# Patient Record
Sex: Female | Born: 1945
Health system: Southern US, Community
[De-identification: ages and names within clinical notes are randomized; demographics above are authoritative.]

## PROBLEM LIST (undated history)

## (undated) ENCOUNTER — Ambulatory Visit: Admission: EM | Source: Home / Self Care

## (undated) DIAGNOSIS — R519 Headache, unspecified: Secondary | ICD-10-CM

## (undated) DIAGNOSIS — E785 Hyperlipidemia, unspecified: Secondary | ICD-10-CM

## (undated) DIAGNOSIS — M1990 Unspecified osteoarthritis, unspecified site: Secondary | ICD-10-CM

## (undated) DIAGNOSIS — K219 Gastro-esophageal reflux disease without esophagitis: Secondary | ICD-10-CM

## (undated) DIAGNOSIS — D860 Sarcoidosis of lung: Secondary | ICD-10-CM

## (undated) DIAGNOSIS — E119 Type 2 diabetes mellitus without complications: Secondary | ICD-10-CM

## (undated) DIAGNOSIS — I700 Atherosclerosis of aorta: Secondary | ICD-10-CM

## (undated) DIAGNOSIS — J45909 Unspecified asthma, uncomplicated: Secondary | ICD-10-CM

## (undated) DIAGNOSIS — Z87898 Personal history of other specified conditions: Secondary | ICD-10-CM

## (undated) DIAGNOSIS — R51 Headache: Secondary | ICD-10-CM

## (undated) DIAGNOSIS — T7840XA Allergy, unspecified, initial encounter: Secondary | ICD-10-CM

## (undated) DIAGNOSIS — R0600 Dyspnea, unspecified: Secondary | ICD-10-CM

## (undated) DIAGNOSIS — I10 Essential (primary) hypertension: Secondary | ICD-10-CM

## (undated) DIAGNOSIS — J189 Pneumonia, unspecified organism: Secondary | ICD-10-CM

## (undated) DIAGNOSIS — D869 Sarcoidosis, unspecified: Secondary | ICD-10-CM

## (undated) HISTORY — PX: OTHER SURGICAL HISTORY: SHX169

## (undated) HISTORY — PX: EYE SURGERY: SHX253

## (undated) HISTORY — PX: TONSILLECTOMY: SUR1361

## (undated) HISTORY — DX: Allergy, unspecified, initial encounter: T78.40XA

## (undated) HISTORY — PX: MEDIASTINOSCOPY: SUR861

## (undated) SURGERY — ELECTROMAGNETIC NAVIGATION BRONCHOSCOPY
Anesthesia: General | Laterality: Right

---

## 2008-06-26 ENCOUNTER — Ambulatory Visit: Payer: Self-pay | Admitting: Specialist

## 2010-02-02 ENCOUNTER — Ambulatory Visit: Payer: Self-pay | Admitting: Unknown Physician Specialty

## 2010-02-08 ENCOUNTER — Ambulatory Visit: Payer: Self-pay | Admitting: Unknown Physician Specialty

## 2010-02-09 LAB — PATHOLOGY REPORT

## 2010-02-11 ENCOUNTER — Ambulatory Visit: Payer: Self-pay | Admitting: Specialist

## 2010-09-03 ENCOUNTER — Ambulatory Visit: Payer: Self-pay | Admitting: Specialist

## 2014-03-31 DIAGNOSIS — J4520 Mild intermittent asthma, uncomplicated: Secondary | ICD-10-CM | POA: Insufficient documentation

## 2014-03-31 DIAGNOSIS — D860 Sarcoidosis of lung: Secondary | ICD-10-CM | POA: Insufficient documentation

## 2015-01-25 ENCOUNTER — Other Ambulatory Visit: Payer: Self-pay | Admitting: Family Medicine

## 2015-01-26 DIAGNOSIS — D869 Sarcoidosis, unspecified: Secondary | ICD-10-CM | POA: Diagnosis not present

## 2015-01-26 DIAGNOSIS — J310 Chronic rhinitis: Secondary | ICD-10-CM | POA: Diagnosis not present

## 2015-01-26 DIAGNOSIS — J4520 Mild intermittent asthma, uncomplicated: Secondary | ICD-10-CM | POA: Diagnosis not present

## 2015-01-28 ENCOUNTER — Encounter: Payer: Self-pay | Admitting: *Deleted

## 2015-01-29 ENCOUNTER — Ambulatory Visit: Payer: Medicare PPO | Admitting: Anesthesiology

## 2015-01-29 ENCOUNTER — Encounter
Admission: RE | Disposition: A | Discharge status: Home / Self Care | Payer: Self-pay | Source: Ambulatory Visit | Attending: Unknown Physician Specialty

## 2015-01-29 ENCOUNTER — Ambulatory Visit
Admission: RE | Admit: 2015-01-29 | Discharge: 2015-01-29 | Disposition: A | Discharge status: Home / Self Care | Payer: Medicare PPO | Source: Ambulatory Visit | Attending: Unknown Physician Specialty | Admitting: Unknown Physician Specialty

## 2015-01-29 DIAGNOSIS — Z79899 Other long term (current) drug therapy: Secondary | ICD-10-CM | POA: Diagnosis not present

## 2015-01-29 DIAGNOSIS — Z1211 Encounter for screening for malignant neoplasm of colon: Secondary | ICD-10-CM | POA: Insufficient documentation

## 2015-01-29 DIAGNOSIS — D123 Benign neoplasm of transverse colon: Secondary | ICD-10-CM | POA: Insufficient documentation

## 2015-01-29 DIAGNOSIS — K5730 Diverticulosis of large intestine without perforation or abscess without bleeding: Secondary | ICD-10-CM | POA: Insufficient documentation

## 2015-01-29 DIAGNOSIS — K635 Polyp of colon: Secondary | ICD-10-CM | POA: Diagnosis not present

## 2015-01-29 DIAGNOSIS — K5790 Diverticulosis of intestine, part unspecified, without perforation or abscess without bleeding: Secondary | ICD-10-CM | POA: Diagnosis not present

## 2015-01-29 DIAGNOSIS — I10 Essential (primary) hypertension: Secondary | ICD-10-CM | POA: Insufficient documentation

## 2015-01-29 DIAGNOSIS — K648 Other hemorrhoids: Secondary | ICD-10-CM | POA: Insufficient documentation

## 2015-01-29 DIAGNOSIS — Z800 Family history of malignant neoplasm of digestive organs: Secondary | ICD-10-CM | POA: Diagnosis not present

## 2015-01-29 DIAGNOSIS — K640 First degree hemorrhoids: Secondary | ICD-10-CM | POA: Diagnosis not present

## 2015-01-29 HISTORY — DX: Essential (primary) hypertension: I10

## 2015-01-29 HISTORY — DX: Sarcoidosis, unspecified: D86.9

## 2015-01-29 HISTORY — PX: COLONOSCOPY WITH PROPOFOL: SHX5780

## 2015-01-29 SURGERY — COLONOSCOPY WITH PROPOFOL
Anesthesia: General

## 2015-01-29 MED ORDER — LIDOCAINE HCL (PF) 2 % IJ SOLN
INTRAMUSCULAR | Status: DC | PRN
  Administered 2015-01-29: 50 mg

## 2015-01-29 MED ORDER — SODIUM CHLORIDE 0.9 % IV SOLN
INTRAVENOUS | Status: DC
Start: 2015-01-29 — End: 2015-01-29
  Administered 2015-01-29: 1000 mL via INTRAVENOUS

## 2015-01-29 MED ORDER — PROPOFOL INFUSION 10 MG/ML OPTIME
INTRAVENOUS | Status: DC | PRN
  Administered 2015-01-29: 100 ug/kg/min via INTRAVENOUS

## 2015-01-29 MED ORDER — SODIUM CHLORIDE 0.9 % IV SOLN: INTRAVENOUS | Status: DC

## 2015-01-29 MED ORDER — EPHEDRINE SULFATE 50 MG/ML IJ SOLN
INTRAMUSCULAR | Status: DC | PRN
  Administered 2015-01-29: 10 mg via INTRAVENOUS

## 2015-01-29 MED ORDER — FENTANYL CITRATE (PF) 100 MCG/2ML IJ SOLN
INTRAMUSCULAR | Status: DC | PRN
  Administered 2015-01-29: 50 ug via INTRAVENOUS

## 2015-01-29 MED ORDER — PROPOFOL 10 MG/ML IV BOLUS
INTRAVENOUS | Status: DC | PRN
Start: 2015-01-29 — End: 2015-01-29
  Administered 2015-01-29: 50 mg via INTRAVENOUS

## 2015-01-29 MED ORDER — MIDAZOLAM HCL 5 MG/5ML IJ SOLN
INTRAMUSCULAR | Status: DC | PRN
  Administered 2015-01-29: 1 mg via INTRAVENOUS

## 2015-01-29 NOTE — Transfer of Care (Signed)
Immediate Anesthesia Transfer of Care Note  Patient: Mary Burke  Procedure(s) Performed: Procedure(s): COLONOSCOPY WITH PROPOFOL (N/A)  Patient Location: PACU  Anesthesia Type:General  Level of Consciousness: sedated  Airway & Oxygen Therapy: Patient Spontanous Breathing and Patient connected to nasal cannula oxygen  Post-op Assessment: Report given to RN and Post -op Vital signs reviewed and stable  Post vital signs: Reviewed and stable  Last Vitals:  Filed Vitals:   01/29/15 1237  BP: 110/73  Pulse: 77  Temp:   Resp: 15    Complications: No apparent anesthesia complications

## 2015-01-29 NOTE — Anesthesia Preprocedure Evaluation (Addendum)
Anesthesia Evaluation  Patient identified by MRN, date of birth, ID band Patient awake    Reviewed: Allergy & Precautions, NPO status , Patient's Chart, lab work & pertinent test results  History of Anesthesia Complications Negative for: history of anesthetic complications  Airway Mallampati: II  TM Distance: >3 FB Neck ROM: Full    Dental no notable dental hx.    Pulmonary neg pulmonary ROS,  breath sounds clear to auscultation  Pulmonary exam normal       Cardiovascular Exercise Tolerance: Good hypertension, Pt. on medications Normal cardiovascular examRhythm:Regular Rate:Normal     Neuro/Psych negative neurological ROS  negative psych ROS   GI/Hepatic Neg liver ROS, GERD-  Medicated and Controlled,  Endo/Other  negative endocrine ROS  Renal/GU negative Renal ROS  negative genitourinary   Musculoskeletal negative musculoskeletal ROS (+)   Abdominal   Peds negative pediatric ROS (+)  Hematology negative hematology ROS (+)   Anesthesia Other Findings sarcoidosis  Reproductive/Obstetrics negative OB ROS                            Anesthesia Physical Anesthesia Plan  ASA: III  Anesthesia Plan: General   Post-op Pain Management:    Induction: Intravenous  Airway Management Planned: Nasal Cannula  Additional Equipment:   Intra-op Plan:   Post-operative Plan:   Informed Consent: I have reviewed the patients History and Physical, chart, labs and discussed the procedure including the risks, benefits and alternatives for the proposed anesthesia with the patient or authorized representative who has indicated his/her understanding and acceptance.   Dental advisory given  Plan Discussed with: CRNA and Surgeon  Anesthesia Plan Comments:         Anesthesia Quick Evaluation

## 2015-01-29 NOTE — Op Note (Signed)
[facility] Gastroenterology Patient Name: Mary Burke Procedure Date: 01/29/2015 11:33 AM MRN: [id number] Account #: [id number] Date of Birth: July 14, 1946 Admit Type: Outpatient Age: 69 Room: [facility] ENDO ROOM 1 Gender: Female Note Status: Finalized Procedure:         Colonoscopy Indications:       Screening in patient at increased risk: Family history of                     1st-degree relative with colorectal cancer Providers:         Manya Silvas, MD Referring MD:      Arlis Porta, MD (Referring MD) Medicines:         Propofol per Anesthesia Complications:     No immediate complications. Procedure:         Pre-Anesthesia Assessment:                    - After reviewing the risks and benefits, the patient was                     deemed in satisfactory condition to undergo the procedure.                    After obtaining informed consent, the colonoscope was                     passed under direct vision. Throughout the procedure, the                     patient's blood pressure, pulse, and oxygen saturations                     were monitored continuously. The Colonoscope was                     introduced through the anus and advanced to the the cecum,                     identified by appendiceal orifice and ileocecal valve. The                     colonoscopy was performed without difficulty. The patient                     tolerated the procedure well. The quality of the bowel                     preparation was excellent. Findings:      Multiple small-mouthed diverticula were found in the sigmoid colon and       in the descending colon.      A diminutive polyp was found in the transverse colon. The polyp was       sessile. The polyp was removed with a jumbo cold forceps. Resection and       retrieval were complete.      Internal hemorrhoids were found during endoscopy. The hemorrhoids were       small and Grade I (internal hemorrhoids  that do not prolapse).      The exam was otherwise without abnormality. Impression:        - Diverticulosis in the sigmoid colon and in the                     descending colon.                    -  One diminutive polyp in the transverse colon. Resected                     and retrieved.                    - Internal hemorrhoids.                    - The examination was otherwise normal. Recommendation:    - Await pathology results. Continue current medication. Manya Silvas, MD 01/29/2015 12:05:12 PM This report has been signed electronically. Number of Addenda: 0 Note Initiated On: 01/29/2015 11:33 AM Scope Withdrawal Time: 0 hours 10 minutes 31 seconds  Total Procedure Duration: 0 hours 19 minutes 36 seconds       [facility]

## 2015-01-29 NOTE — Anesthesia Postprocedure Evaluation (Signed)
  Anesthesia Post-op Note  Patient: Mary Burke  Procedure(s) Performed: Procedure(s): COLONOSCOPY WITH PROPOFOL (N/A)  Anesthesia type:General  Patient location: PACU  Post pain: Pain level controlled  Post assessment: Post-op Vital signs reviewed, Patient's Cardiovascular Status Stable, Respiratory Function Stable, Patent Airway and No signs of Nausea or vomiting  Post vital signs: Reviewed and stable  Last Vitals:  Filed Vitals:   01/29/15 1217  BP: 114/68  Pulse: 93  Temp:   Resp: 12    Level of consciousness: awake, alert  and patient cooperative  Complications: No apparent anesthesia complications
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
## 2015-01-30 LAB — SURGICAL PATHOLOGY

## 2015-01-30 NOTE — Progress Notes (Signed)
Non-identifying voicemail.  No message left.

## 2015-02-09 ENCOUNTER — Encounter: Payer: Self-pay | Admitting: Unknown Physician Specialty

## 2015-02-22 ENCOUNTER — Other Ambulatory Visit: Payer: Self-pay | Admitting: Family Medicine

## 2015-02-26 DIAGNOSIS — J309 Allergic rhinitis, unspecified: Secondary | ICD-10-CM | POA: Diagnosis not present

## 2015-02-26 DIAGNOSIS — D869 Sarcoidosis, unspecified: Secondary | ICD-10-CM | POA: Diagnosis not present

## 2015-02-26 DIAGNOSIS — K219 Gastro-esophageal reflux disease without esophagitis: Secondary | ICD-10-CM | POA: Diagnosis not present

## 2015-02-26 DIAGNOSIS — M810 Age-related osteoporosis without current pathological fracture: Secondary | ICD-10-CM | POA: Diagnosis not present

## 2015-02-26 DIAGNOSIS — M1990 Unspecified osteoarthritis, unspecified site: Secondary | ICD-10-CM | POA: Diagnosis not present

## 2015-02-26 DIAGNOSIS — J449 Chronic obstructive pulmonary disease, unspecified: Secondary | ICD-10-CM | POA: Diagnosis not present

## 2015-02-26 DIAGNOSIS — I10 Essential (primary) hypertension: Secondary | ICD-10-CM | POA: Diagnosis not present

## 2015-02-26 DIAGNOSIS — Z7982 Long term (current) use of aspirin: Secondary | ICD-10-CM | POA: Diagnosis not present

## 2015-02-26 DIAGNOSIS — Z6831 Body mass index (BMI) 31.0-31.9, adult: Secondary | ICD-10-CM | POA: Diagnosis not present

## 2015-03-25 ENCOUNTER — Other Ambulatory Visit: Payer: Self-pay | Admitting: Family Medicine

## 2015-05-06 ENCOUNTER — Encounter: Payer: Self-pay | Admitting: Family Medicine

## 2015-05-06 ENCOUNTER — Ambulatory Visit (INDEPENDENT_AMBULATORY_CARE_PROVIDER_SITE_OTHER): Payer: Medicare PPO | Admitting: Family Medicine

## 2015-05-06 VITALS — BP 127/71 | HR 76 | Resp 16 | Ht 61.5 in | Wt 164.2 lb

## 2015-05-06 DIAGNOSIS — Z23 Encounter for immunization: Secondary | ICD-10-CM

## 2015-05-06 DIAGNOSIS — Z0000 Encounter for general adult medical examination without abnormal findings: Secondary | ICD-10-CM | POA: Diagnosis not present

## 2015-05-06 NOTE — Patient Instructions (Addendum)
Health Maintenance  Topic Date Due  . Hepatitis C Screening  1946/03/21  . MAMMOGRAM  07/01/1998  . DEXA SCAN  07/01/2013  . INFLUENZA VACCINE  02/16/2016  . TETANUS/TDAP  02/19/2023  . COLONOSCOPY  01/28/2025  . ZOSTAVAX  Completed  . PNA vac Low Risk Adult  Completed

## 2015-05-06 NOTE — Progress Notes (Signed)
Patient: Mary Burke, Female    DOB: 1945-12-07, 69 y.o.   MRN: [id number] Visit Date: 05/06/2015  Today's Provider: Dicky Doe, MD  Chief Complaint  Patient presents with  . Medicare Wellness    Initial    Subjective:   Initial preventative physical exam Mary Burke is a 69 y.o. female who presents today for her Initial Preventative Physical Exam. She feels well. She reports exercising . She reports she is sleeping fairly well.  HPI  Review of Systems  Social History   Social History  . Marital Status: Married    Spouse Name: N/A  . Number of Children: N/A  . Years of Education: N/A   Occupational History  . Not on file.   Social History Main Topics  . Smoking status: Never Smoker   . Smokeless tobacco: Never Used  . Alcohol Use: No  . Drug Use: No  . Sexual Activity: Not on file   Other Topics Concern  . Not on file   Social History Narrative    Patient Active Problem List   Diagnosis Date Noted  . Need for pneumococcal vaccination 05/06/2015  . Medicare annual wellness visit, initial 05/06/2015  . Asthma, mild intermittent 03/31/2014  . Pulmonary sarcoidosis ([place]) 03/31/2014    Past Surgical History  Procedure Laterality Date  . Mediastinoscopy    . Colonoscopy with propofol N/A 01/29/2015    Procedure: COLONOSCOPY WITH PROPOFOL;  Surgeon: Manya Silvas, MD;  Location: [facility] ENDOSCOPY;  Service: Endoscopy;  Laterality: N/A;    Her family history includes COPD in her father; Cancer in her maternal grandmother; Stroke in her mother.    Previous Medications   ALBUTEROL (PROAIR HFA) 108 (90 BASE) MCG/ACT INHALER    Inhale 2 puffs using inhaler every four to six hours as needed   ASPIRIN 81 MG TABLET    Take 81 mg by mouth daily.   AZELASTINE (ASTELIN) 0.1 % NASAL SPRAY    Place into both nostrils 2 (two) times daily. Use in each nostril as directed   B COMPLEX VITAMINS TABLET    Take 1 tablet by mouth daily.   CALCIUM  CITRATE-VITAMIN D (CITRACAL+D) 315-200 MG-UNIT PER TABLET    Take 1 tablet by mouth 2 (two) times daily.   DHA-EPA-VITAMIN E PO    Take 1 capsule by mouth once a day as directed   FLUTICASONE (FLOVENT HFA) 110 MCG/ACT INHALER    Inhale into the lungs.   GINKGO BILOBA EXTRACT (GNP GINGKO BILOBA EXTRACT) 60 MG CAPS    Take by mouth.   GLUCOSAMINE-CHONDROITIN 500-400 MG TABLET    Take 1 tablet by mouth 3 (three) times daily.   LORATADINE (CLARITIN) 10 MG TABLET    Take 1 tablet by mouth once a day as directed   LOSARTAN (COZAAR) 25 MG TABLET    Take by mouth.   MONTELUKAST (SINGULAIR) 10 MG TABLET    TAKE 1 TABLET BY MOUTH EVERY DAY   OMEGA-3 KRILL OIL 300 MG CAPS    Take by mouth.   OMEPRAZOLE (PRILOSEC) 40 MG CAPSULE    Take 40 mg by mouth daily.   PROBIOTIC PRODUCT (PROBIOTIC & ACIDOPHILUS EX ST PO)    Take by mouth.   RALOXIFENE (EVISTA) 60 MG TABLET    Take by mouth.   VITAMIN E 400 UNIT CAPSULE    Take 400 Units by mouth daily.    Patient Care Team: Arlis Porta., MD as PCP - General (Family  Medicine)     Objective:   Vitals: BP 127/71 mmHg  Pulse 76  Resp 16  Ht 5' 1.5" (1.562 m)  Wt 164 lb 3.2 oz (74.481 kg)  BMI 30.53 kg/m2  Physical Exam    Hearing Screening   Method: Audiometry   125Hz  250Hz  500Hz  1000Hz  2000Hz  4000Hz  8000Hz   Right ear: Pass Pass Pass Pass Pass Pass Pass  Left ear: Pass Pass Pass Pass Pass Pass Pass    Visual Acuity Screening   Right eye Left eye Both eyes  Without correction:     With correction: 20/15 20/15 20/15     Activities of Daily Living In your present state of health, do you have any difficulty performing the following activities: 05/06/2015  Hearing? N  Vision? N  Difficulty concentrating or making decisions? N  Walking or climbing stairs? N  Dressing or bathing? N  Doing errands, shopping? N  Preparing Food and eating ? N  Using the Toilet? N  In the past six months, have you accidently leaked urine? N  Do you have  problems with loss of bowel control? N  Managing your Medications? N  Managing your Finances? N  Housekeeping or managing your Housekeeping? N    Fall Risk Assessment Fall Risk  05/06/2015  Falls in the past year? No     Patient reports there are safety devices in place in shower at home  Depression Screen PHQ 2/9 Scores 05/06/2015  PHQ - 2 Score 0    MMSE MMSE - Mini Mental State Exam 05/06/2015  Orientation to time 5  Orientation to Place 5  Registration 3  Attention/ Calculation 5  Recall 3  Language- name 2 objects 2  Language- repeat 1  Language- follow 3 step command 3  Language- read & follow direction 1  Write a sentence 1  Copy design 1  Total score 30      Assessment & Plan:     Initial Preventative Physical Exam  Reviewed patient's Family Medical History Reviewed and updated list of patient's medical providers Assessment of cognitive impairment was done Assessed patient's functional ability Established a written schedule for health screening [place] Completed and Reviewed  Exercise Activities and Dietary recommendations Goals    . Lose Weight      Would like to be below 160 lbs. Also would like to get more sleep.        Immunization History  Administered Date(s) Administered  . Influenza-Unspecified 03/18/2014, 04/17/2014, 04/19/2015  . Pneumococcal Conjugate-13 04/24/2014  . Pneumococcal Polysaccharide-23 07/18/2009, 05/06/2015  . Tdap 02/18/2013  . Zoster 07/18/2014    Health Maintenance  Topic Date Due  . Hepatitis C Screening  Jan 15, 1946  . MAMMOGRAM  07/01/1998  . DEXA SCAN  07/01/2013  . INFLUENZA VACCINE  02/16/2016  . TETANUS/TDAP  02/19/2023  . COLONOSCOPY  01/28/2025  . ZOSTAVAX  Completed  . PNA vac Low Risk Adult  Completed      Discussed health benefits of physical activity, and encouraged her to engage in regular exercise appropriate for her age and condition.     ------------------------------------------------------------------------------------------------------------   Problem List Items Addressed This Visit      Other   Need for pneumococcal vaccination - Primary   Relevant Orders   Pneumococcal polysaccharide vaccine 23-valent greater than or equal to 2yo subcutaneous/IM (Completed)   Medicare annual wellness visit, initial       Larene Beach, MD [facility]  05/06/2015  1. Medicare annual wellness visit, initial   2. Need for pneumococcal vaccination  - Pneumococcal polysaccharide vaccine 23-valent greater than or equal to 2yo subcutaneous/IM

## 2015-05-26 DIAGNOSIS — E119 Type 2 diabetes mellitus without complications: Secondary | ICD-10-CM | POA: Diagnosis not present

## 2015-05-26 LAB — HM DIABETES EYE EXAM

## 2015-05-29 ENCOUNTER — Encounter: Payer: Self-pay | Admitting: Family Medicine

## 2015-06-02 ENCOUNTER — Encounter: Payer: Self-pay | Admitting: Family Medicine

## 2015-06-09 ENCOUNTER — Ambulatory Visit (INDEPENDENT_AMBULATORY_CARE_PROVIDER_SITE_OTHER): Payer: Medicare PPO | Admitting: Family Medicine

## 2015-06-09 ENCOUNTER — Encounter: Payer: Self-pay | Admitting: Family Medicine

## 2015-06-09 VITALS — BP 132/83 | HR 93 | Temp 98.2°F | Resp 16 | Ht 61.5 in | Wt 161.6 lb

## 2015-06-09 DIAGNOSIS — R1013 Epigastric pain: Secondary | ICD-10-CM | POA: Diagnosis not present

## 2015-06-09 DIAGNOSIS — R7303 Prediabetes: Secondary | ICD-10-CM | POA: Diagnosis not present

## 2015-06-09 DIAGNOSIS — K219 Gastro-esophageal reflux disease without esophagitis: Secondary | ICD-10-CM

## 2015-06-09 MED ORDER — OMEPRAZOLE 40 MG PO CPDR: DELAYED_RELEASE_CAPSULE | ORAL | Status: DC
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
## 2015-06-09 NOTE — Patient Instructions (Signed)
May take Zantac 75 mg., 2 at time of epigastric distress.

## 2015-06-10 ENCOUNTER — Other Ambulatory Visit: Payer: Self-pay | Admitting: Family Medicine

## 2015-06-10 LAB — HEMOGLOBIN A1C
Est. average glucose Bld gHb Est-mCnc: 123 mg/dL
Hgb A1c MFr Bld: 5.9 % — ABNORMAL HIGH (ref 4.8–5.6)

## 2015-06-23 ENCOUNTER — Other Ambulatory Visit: Payer: Self-pay | Admitting: Family Medicine

## 2015-08-03 ENCOUNTER — Telehealth: Payer: Self-pay | Admitting: Family Medicine

## 2015-08-03 NOTE — Telephone Encounter (Signed)
This is fu for Mary Burke explained to patient

## 2015-08-03 NOTE — Telephone Encounter (Signed)
Pt has appt scheduled for Thursday this week for A1C.  She thought it had only been 2 months since her last one.  Will you please check so she can reschedule if she needs to?  Please call [phone]

## 2015-08-06 ENCOUNTER — Encounter: Payer: Self-pay | Admitting: Family Medicine

## 2015-08-06 ENCOUNTER — Ambulatory Visit (INDEPENDENT_AMBULATORY_CARE_PROVIDER_SITE_OTHER): Payer: Medicare Other | Admitting: Family Medicine

## 2015-08-06 VITALS — BP 119/73 | HR 85 | Temp 97.5°F | Resp 16 | Ht 61.5 in | Wt 165.0 lb

## 2015-08-06 DIAGNOSIS — K2100 Gastro-esophageal reflux disease with esophagitis, without bleeding: Secondary | ICD-10-CM

## 2015-08-06 DIAGNOSIS — R7303 Prediabetes: Secondary | ICD-10-CM

## 2015-08-06 MED ORDER — RANITIDINE HCL 150 MG PO TABS: 150.0000 mg | ORAL_TABLET | Freq: Every day | ORAL | Status: DC
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
## 2015-08-07 ENCOUNTER — Telehealth: Payer: Self-pay | Admitting: *Deleted

## 2015-08-07 NOTE — Telephone Encounter (Signed)
Called to let patient know appt with Mary Burke has been scheduled for 08/31/14 at 9:30.

## 2015-09-30 ENCOUNTER — Encounter: Payer: Self-pay | Admitting: *Deleted

## 2015-10-01 ENCOUNTER — Ambulatory Visit
Admission: RE | Admit: 2015-10-01 | Discharge: 2015-10-01 | Disposition: A | Discharge status: Home / Self Care | Payer: Medicare Other | Source: Ambulatory Visit | Attending: Unknown Physician Specialty | Admitting: Unknown Physician Specialty

## 2015-10-01 ENCOUNTER — Ambulatory Visit: Payer: Medicare Other | Admitting: Anesthesiology

## 2015-10-01 ENCOUNTER — Encounter
Admission: RE | Disposition: A | Discharge status: Home / Self Care | Payer: Self-pay | Source: Ambulatory Visit | Attending: Unknown Physician Specialty

## 2015-10-01 ENCOUNTER — Encounter: Payer: Self-pay | Admitting: *Deleted

## 2015-10-01 DIAGNOSIS — K219 Gastro-esophageal reflux disease without esophagitis: Secondary | ICD-10-CM | POA: Diagnosis not present

## 2015-10-01 DIAGNOSIS — Z888 Allergy status to other drugs, medicaments and biological substances status: Secondary | ICD-10-CM | POA: Diagnosis not present

## 2015-10-01 DIAGNOSIS — K222 Esophageal obstruction: Secondary | ICD-10-CM | POA: Diagnosis not present

## 2015-10-01 DIAGNOSIS — K2950 Unspecified chronic gastritis without bleeding: Secondary | ICD-10-CM | POA: Diagnosis not present

## 2015-10-01 DIAGNOSIS — Z7982 Long term (current) use of aspirin: Secondary | ICD-10-CM | POA: Diagnosis not present

## 2015-10-01 DIAGNOSIS — J45909 Unspecified asthma, uncomplicated: Secondary | ICD-10-CM | POA: Diagnosis not present

## 2015-10-01 DIAGNOSIS — I10 Essential (primary) hypertension: Secondary | ICD-10-CM | POA: Insufficient documentation

## 2015-10-01 DIAGNOSIS — Z79899 Other long term (current) drug therapy: Secondary | ICD-10-CM | POA: Diagnosis not present

## 2015-10-01 DIAGNOSIS — R1310 Dysphagia, unspecified: Secondary | ICD-10-CM | POA: Diagnosis present

## 2015-10-01 HISTORY — DX: Unspecified asthma, uncomplicated: J45.909

## 2015-10-01 HISTORY — DX: Pneumonia, unspecified organism: J18.9

## 2015-10-01 HISTORY — DX: Personal history of other specified conditions: Z87.898

## 2015-10-01 HISTORY — PX: ESOPHAGOGASTRODUODENOSCOPY (EGD) WITH PROPOFOL: SHX5813

## 2015-10-01 HISTORY — DX: Headache: R51

## 2015-10-01 HISTORY — DX: Headache, unspecified: R51.9

## 2015-10-01 HISTORY — DX: Gastro-esophageal reflux disease without esophagitis: K21.9

## 2015-10-01 SURGERY — ESOPHAGOGASTRODUODENOSCOPY (EGD) WITH PROPOFOL
Anesthesia: General

## 2015-10-01 MED ORDER — SODIUM CHLORIDE 0.9 % IV SOLN: INTRAVENOUS | Status: DC

## 2015-10-01 MED ORDER — MIDAZOLAM HCL 2 MG/2ML IJ SOLN
INTRAMUSCULAR | Status: DC | PRN
  Administered 2015-10-01: 1 mg via INTRAVENOUS

## 2015-10-01 MED ORDER — PROPOFOL 500 MG/50ML IV EMUL
INTRAVENOUS | Status: DC | PRN
  Administered 2015-10-01: 120 ug/kg/min via INTRAVENOUS

## 2015-10-01 MED ORDER — FENTANYL CITRATE (PF) 100 MCG/2ML IJ SOLN
INTRAMUSCULAR | Status: DC | PRN
  Administered 2015-10-01: 50 ug via INTRAVENOUS

## 2015-10-01 MED ORDER — SODIUM CHLORIDE 0.9 % IV SOLN
INTRAVENOUS | Status: DC
  Administered 2015-10-01: 09:00:00 via INTRAVENOUS

## 2015-10-01 MED ORDER — LIDOCAINE HCL (CARDIAC) 20 MG/ML IV SOLN
INTRAVENOUS | Status: DC | PRN
  Administered 2015-10-01: 30 mg via INTRAVENOUS

## 2015-10-01 NOTE — Transfer of Care (Signed)
Immediate Anesthesia Transfer of Care Note  Patient: Mary Burke  Procedure(s) Performed: Procedure(s): ESOPHAGOGASTRODUODENOSCOPY (EGD) WITH PROPOFOL  Patient Location: PACU  Anesthesia Type:General  Level of Consciousness: awake and sedated  Airway & Oxygen Therapy: Patient Spontanous Breathing and Patient connected to nasal cannula oxygen  Post-op Assessment: Report given to RN and Post -op Vital signs reviewed and stable  Post vital signs: Reviewed and stable  Last Vitals:  Filed Vitals:   10/01/15 0904  BP: 91/72  Pulse: 96  Temp: 35.8 C  Resp: 20    Complications: No apparent anesthesia complications

## 2015-10-01 NOTE — Op Note (Signed)
[facility] Patient Name: Mary Burke Procedure Date: 10/01/2015 9:25 AM MRN: [id number] Account #: [id number] Date of Birth: 1946/01/04 Admit Type: Outpatient Age: 70 Room: [facility] ENDO ROOM 1 Gender: Female Note Status: Finalized Procedure:            Upper GI endoscopy Indications:          Dysphagia, Heartburn Providers:            Manya Silvas, MD Referring MD:         Arlis Porta, MD (Referring MD) Medicines:            Propofol per Anesthesia Complications:        No immediate complications. Procedure:            Pre-Anesthesia Assessment:                       - After reviewing the risks and benefits, the patient                        was deemed in satisfactory condition to undergo the                        procedure.                       After obtaining informed consent, the endoscope was                        passed under direct vision. Throughout the procedure,                        the patient's blood pressure, pulse, and oxygen                        saturations were monitored continuously. The Endoscope                        was introduced through the mouth, and advanced to the                        second part of duodenum. The upper GI endoscopy was                        accomplished without difficulty. The patient tolerated                        the procedure well. Findings:      A mild Schatzki ring (acquired) was found at the gastroesophageal       junction. A guidewire was placed and the scope was withdrawn. Dilation       was performed with a Savary dilator with mild resistance at 17 mm.      Patchy mildly erythematous mucosa without bleeding was found in the       gastric antrum. Biopsies were taken with a cold forceps for histology.       Biopsies were taken with a cold forceps for Helicobacter pylori testing.      The examined duodenum was normal. Impression:           - Mild Schatzki ring.  Dilated.                       -  Erythematous mucosa in the antrum. Biopsied.                       - Normal examined duodenum. Recommendation:       - Await pathology results. Manya Silvas, MD 10/01/2015 9:48:05 AM This report has been signed electronically. Number of Addenda: 0 Note Initiated On: 10/01/2015 9:25 AM      [facility]

## 2015-10-01 NOTE — Anesthesia Postprocedure Evaluation (Signed)
Anesthesia Post Note  Patient: Mary Burke  Procedure(s) Performed: Procedure(s): ESOPHAGOGASTRODUODENOSCOPY (EGD) WITH PROPOFOL  Patient location during evaluation: PACU Anesthesia Type: General Level of consciousness: awake and alert Pain management: pain level controlled Vital Signs Assessment: post-procedure vital signs reviewed and stable Respiratory status: spontaneous breathing, nonlabored ventilation, respiratory function stable and patient connected to nasal cannula oxygen Cardiovascular status: blood pressure returned to baseline and stable Postop Assessment: no signs of nausea or vomiting Anesthetic complications: no    Last Vitals:  Filed Vitals:   10/01/15 0950 10/01/15 1000  BP: 106/50 113/66  Pulse: 84 92  Temp: 36.4 C   Resp: 18 19    Last Pain: There were no vitals filed for this visit.               Molli Barrows
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
## 2015-10-01 NOTE — Anesthesia Procedure Notes (Signed)
Performed by: COOK-MARTIN, Gerri Acre Pre-anesthesia Checklist: Patient identified, Emergency Drugs available, Suction available, Patient being monitored and Timeout performed Patient Re-evaluated:Patient Re-evaluated prior to inductionOxygen Delivery Method: Nasal cannula Preoxygenation: Pre-oxygenation with 100% oxygen Intubation Type: IV induction Placement Confirmation: CO2 detector and positive ETCO2       
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
## 2015-10-02 LAB — SURGICAL PATHOLOGY

## 2015-10-07 ENCOUNTER — Encounter: Payer: Self-pay | Admitting: Unknown Physician Specialty

## 2015-10-26 ENCOUNTER — Encounter: Payer: Self-pay | Admitting: Family Medicine

## 2015-10-26 ENCOUNTER — Ambulatory Visit (INDEPENDENT_AMBULATORY_CARE_PROVIDER_SITE_OTHER): Payer: Medicare Other | Admitting: Family Medicine

## 2015-10-26 VITALS — BP 126/79 | HR 90 | Temp 97.4°F | Resp 16 | Ht 61.5 in | Wt 161.0 lb

## 2015-10-26 DIAGNOSIS — Z0000 Encounter for general adult medical examination without abnormal findings: Secondary | ICD-10-CM | POA: Diagnosis not present

## 2015-10-26 DIAGNOSIS — I10 Essential (primary) hypertension: Secondary | ICD-10-CM | POA: Diagnosis not present

## 2015-10-26 DIAGNOSIS — K2100 Gastro-esophageal reflux disease with esophagitis, without bleeding: Secondary | ICD-10-CM

## 2015-10-26 DIAGNOSIS — M810 Age-related osteoporosis without current pathological fracture: Secondary | ICD-10-CM

## 2015-10-26 DIAGNOSIS — K219 Gastro-esophageal reflux disease without esophagitis: Secondary | ICD-10-CM | POA: Diagnosis not present

## 2015-10-26 DIAGNOSIS — D860 Sarcoidosis of lung: Secondary | ICD-10-CM | POA: Diagnosis not present

## 2015-10-26 DIAGNOSIS — M8580 Other specified disorders of bone density and structure, unspecified site: Secondary | ICD-10-CM | POA: Insufficient documentation

## 2015-10-26 DIAGNOSIS — Z780 Asymptomatic menopausal state: Secondary | ICD-10-CM | POA: Insufficient documentation

## 2015-10-26 MED ORDER — RALOXIFENE HCL 60 MG PO TABS: 60.0000 mg | ORAL_TABLET | Freq: Every day | ORAL | Status: DC

## 2015-10-26 MED ORDER — LOSARTAN POTASSIUM 25 MG PO TABS: 25.0000 mg | ORAL_TABLET | Freq: Every day | ORAL | Status: DC

## 2015-10-26 MED ORDER — OMEPRAZOLE 40 MG PO CPDR: DELAYED_RELEASE_CAPSULE | ORAL | Status: DC

## 2015-10-26 NOTE — Patient Instructions (Signed)
Cont. To follow up with Dr. Vira Agar, Dr. Raul Del and Dr. Clayborn Bigness.
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
## 2015-10-28 LAB — CBC WITH DIFFERENTIAL/PLATELET
Basophils Absolute: 0.1 10*3/uL (ref 0.0–0.2)
Basos: 1 %
EOS (ABSOLUTE): 0.1 10*3/uL (ref 0.0–0.4)
Eos: 2 %
Hematocrit: 39.3 % (ref 34.0–46.6)
Hemoglobin: 13.5 g/dL (ref 11.1–15.9)
Immature Grans (Abs): 0 10*3/uL (ref 0.0–0.1)
Immature Granulocytes: 0 %
Lymphocytes Absolute: 1.8 10*3/uL (ref 0.7–3.1)
Lymphs: 29 %
MCH: 29.9 pg (ref 26.6–33.0)
MCHC: 34.4 g/dL (ref 31.5–35.7)
MCV: 87 fL (ref 79–97)
Monocytes Absolute: 0.8 10*3/uL (ref 0.1–0.9)
Monocytes: 12 %
Neutrophils Absolute: 3.5 10*3/uL (ref 1.4–7.0)
Neutrophils: 56 %
Platelets: 417 10*3/uL — ABNORMAL HIGH (ref 150–379)
RBC: 4.51 x10E6/uL (ref 3.77–5.28)
RDW: 13.6 % (ref 12.3–15.4)
WBC: 6.3 10*3/uL (ref 3.4–10.8)

## 2015-10-28 LAB — COMPREHENSIVE METABOLIC PANEL
ALT: 47 IU/L — ABNORMAL HIGH (ref 0–32)
AST: 29 IU/L (ref 0–40)
Albumin/Globulin Ratio: 1.5 (ref 1.2–2.2)
Albumin: 4.3 g/dL (ref 3.6–4.8)
Alkaline Phosphatase: 66 IU/L (ref 39–117)
BUN/Creatinine Ratio: 16 (ref 12–28)
BUN: 10 mg/dL (ref 8–27)
Bilirubin Total: 0.4 mg/dL (ref 0.0–1.2)
CO2: 24 mmol/L (ref 18–29)
Calcium: 9.3 mg/dL (ref 8.7–10.3)
Chloride: 100 mmol/L (ref 96–106)
Creatinine, Ser: 0.61 mg/dL (ref 0.57–1.00)
GFR calc Af Amer: 108 mL/min/{1.73_m2} (ref >59)
GFR calc non Af Amer: 94 mL/min/{1.73_m2} (ref >59)
Globulin, Total: 2.8 g/dL (ref 1.5–4.5)
Glucose: 92 mg/dL (ref 65–99)
Potassium: 4 mmol/L (ref 3.5–5.2)
Sodium: 141 mmol/L (ref 134–144)
Total Protein: 7.1 g/dL (ref 6.0–8.5)

## 2015-10-28 LAB — LIPID PANEL
Chol/HDL Ratio: 4.1 ratio units (ref 0.0–4.4)
Cholesterol, Total: 193 mg/dL (ref 100–199)
HDL: 47 mg/dL (ref >39)
LDL Calculated: 111 mg/dL — ABNORMAL HIGH (ref 0–99)
Triglycerides: 175 mg/dL — ABNORMAL HIGH (ref 0–149)
VLDL Cholesterol Cal: 35 mg/dL (ref 5–40)

## 2015-10-29 ENCOUNTER — Other Ambulatory Visit: Payer: Self-pay | Admitting: *Deleted

## 2015-10-29 ENCOUNTER — Other Ambulatory Visit: Payer: Self-pay | Admitting: Family Medicine

## 2015-10-29 DIAGNOSIS — R748 Abnormal levels of other serum enzymes: Secondary | ICD-10-CM

## 2015-10-29 DIAGNOSIS — D691 Qualitative platelet defects: Secondary | ICD-10-CM

## 2015-11-05 ENCOUNTER — Telehealth: Payer: Self-pay | Admitting: *Deleted

## 2015-11-05 DIAGNOSIS — Z0000 Encounter for general adult medical examination without abnormal findings: Secondary | ICD-10-CM

## 2015-11-05 DIAGNOSIS — M810 Age-related osteoporosis without current pathological fracture: Secondary | ICD-10-CM

## 2015-11-05 NOTE — Telephone Encounter (Signed)
Patient would like an order entered for bone density and mammogram.

## 2015-11-05 NOTE — Telephone Encounter (Signed)
May order both-jh

## 2015-11-05 NOTE — Telephone Encounter (Signed)
Orders released and put on md desk for signature.[place]

## 2015-11-17 LAB — HM MAMMOGRAPHY

## 2016-01-06 ENCOUNTER — Other Ambulatory Visit: Payer: Self-pay | Admitting: Family Medicine

## 2016-01-23 ENCOUNTER — Other Ambulatory Visit: Payer: Self-pay | Admitting: Family Medicine

## 2016-02-08 ENCOUNTER — Encounter: Payer: Self-pay | Admitting: Family Medicine

## 2016-02-08 ENCOUNTER — Ambulatory Visit (INDEPENDENT_AMBULATORY_CARE_PROVIDER_SITE_OTHER): Payer: Medicare Other | Admitting: Family Medicine

## 2016-02-08 VITALS — BP 121/83 | HR 89 | Temp 98.6°F | Resp 16 | Ht 61.5 in | Wt 146.0 lb

## 2016-02-08 DIAGNOSIS — R7309 Other abnormal glucose: Secondary | ICD-10-CM

## 2016-02-08 DIAGNOSIS — D860 Sarcoidosis of lung: Secondary | ICD-10-CM

## 2016-02-08 DIAGNOSIS — J302 Other seasonal allergic rhinitis: Secondary | ICD-10-CM | POA: Insufficient documentation

## 2016-02-08 DIAGNOSIS — K219 Gastro-esophageal reflux disease without esophagitis: Secondary | ICD-10-CM

## 2016-02-08 DIAGNOSIS — M810 Age-related osteoporosis without current pathological fracture: Secondary | ICD-10-CM | POA: Diagnosis not present

## 2016-02-08 DIAGNOSIS — E119 Type 2 diabetes mellitus without complications: Secondary | ICD-10-CM | POA: Insufficient documentation

## 2016-02-08 DIAGNOSIS — I10 Essential (primary) hypertension: Secondary | ICD-10-CM

## 2016-02-08 DIAGNOSIS — J4520 Mild intermittent asthma, uncomplicated: Secondary | ICD-10-CM | POA: Diagnosis not present

## 2016-02-08 LAB — POCT GLYCOSYLATED HEMOGLOBIN (HGB A1C): Hemoglobin A1C: 5.8
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
## 2016-04-25 ENCOUNTER — Ambulatory Visit: Payer: Medicare PPO | Admitting: Family Medicine

## 2016-05-31 ENCOUNTER — Other Ambulatory Visit: Payer: Self-pay | Admitting: Family Medicine

## 2016-05-31 DIAGNOSIS — K219 Gastro-esophageal reflux disease without esophagitis: Secondary | ICD-10-CM

## 2016-05-31 MED ORDER — OMEPRAZOLE 40 MG PO CPDR: DELAYED_RELEASE_CAPSULE | ORAL | 3 refills | Status: DC

## 2016-07-26 ENCOUNTER — Ambulatory Visit (INDEPENDENT_AMBULATORY_CARE_PROVIDER_SITE_OTHER): Payer: Medicare Other | Admitting: Podiatry

## 2016-07-26 VITALS — BP 111/61 | HR 74 | Temp 97.7°F | Resp 16

## 2016-07-26 DIAGNOSIS — L03039 Cellulitis of unspecified toe: Secondary | ICD-10-CM

## 2016-07-26 DIAGNOSIS — M79676 Pain in unspecified toe(s): Secondary | ICD-10-CM | POA: Diagnosis not present

## 2016-07-26 DIAGNOSIS — L600 Ingrowing nail: Secondary | ICD-10-CM | POA: Diagnosis not present

## 2016-07-26 NOTE — Progress Notes (Signed)
   Subjective:    Patient ID: Mary Burke, female    DOB: 1945/09/06, 71 y.o.   MRN: [id number]  HPI    Review of Systems  All other systems reviewed and are negative.      Objective:   Physical Exam        Assessment & Plan:

## 2016-07-31 NOTE — Progress Notes (Signed)
Patient ID: Mary Burke, female   DOB: 1945-12-30, 71 y.o.   MRN: [id number] Subjective: Patient presents today for evaluation of pain in toe(s). Patient is concerned for possible ingrown nail. Patient states that the pain has been present for a few weeks now. Patient presents today for further treatment and evaluation.  Objective:  General: Well developed, nourished, in no acute distress, alert and oriented x3   Dermatology: Skin is warm, dry and supple bilateral. Lateral border of the left great toe appears to be erythematous with evidence of an ingrowing nail.  Pain on palpation noted to the border of the nail fold. The remaining nails appear unremarkable at this time. There are no open sores, lesions.  Vascular: Dorsalis Pedis artery and Posterior Tibial artery pedal pulses palpable. No lower extremity edema noted.   Neruologic: Grossly intact via light touch bilateral.  Musculoskeletal: Muscular strength within normal limits in all groups bilateral. Normal range of motion noted to all pedal and ankle joints.   Assesement: #1 Paronychia with ingrowing nail lateral border left great toe #2 Pain in toe #3 Incurvated nail  Plan of Care:  1. Patient evaluated.  2. Discussed treatment alternatives and plan of care. Explained nail avulsion procedure and post procedure course to patient. 3. Patient opted for permanent partial nail avulsion.  4. Prior to procedure, local anesthesia infiltration utilized using 3 ml of a 50:50 mixture of 2% plain lidocaine and 0.5% plain marcaine in a normal hallux block fashion and a betadine prep performed.  5. Partial permanent nail avulsion with chemical matrixectomy performed using [id number] applications of phenol followed by alcohol flush.  6. Light dressing applied. 7. Return to clinic in 2 weeks.   Edrick Kins, DPM [facility]  Dr. Edrick Kins, [place]                                        [place],  [place] [address]                Office [phone]  Fax [phone]

## 2016-08-09 ENCOUNTER — Ambulatory Visit (INDEPENDENT_AMBULATORY_CARE_PROVIDER_SITE_OTHER): Payer: Medicare Other | Admitting: Family Medicine

## 2016-08-09 ENCOUNTER — Encounter: Payer: Self-pay | Admitting: Family Medicine

## 2016-08-09 VITALS — BP 120/70 | HR 85 | Temp 98.2°F | Resp 16 | Ht 61.5 in | Wt 152.0 lb

## 2016-08-09 DIAGNOSIS — E119 Type 2 diabetes mellitus without complications: Secondary | ICD-10-CM

## 2016-08-09 DIAGNOSIS — I10 Essential (primary) hypertension: Secondary | ICD-10-CM | POA: Diagnosis not present

## 2016-08-09 DIAGNOSIS — K219 Gastro-esophageal reflux disease without esophagitis: Secondary | ICD-10-CM

## 2016-08-09 DIAGNOSIS — J4520 Mild intermittent asthma, uncomplicated: Secondary | ICD-10-CM | POA: Diagnosis not present

## 2016-08-09 DIAGNOSIS — Z23 Encounter for immunization: Secondary | ICD-10-CM

## 2016-08-09 LAB — POCT GLYCOSYLATED HEMOGLOBIN (HGB A1C): Hemoglobin A1C: 6

## 2016-08-09 NOTE — Patient Instructions (Signed)
Complete physical on follow up with Dr. Raliegh Ip.
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
## 2016-08-11 ENCOUNTER — Ambulatory Visit: Payer: Medicare Other | Admitting: Podiatry

## 2016-08-16 LAB — HM DIABETES EYE EXAM

## 2016-08-18 ENCOUNTER — Encounter: Payer: Self-pay | Admitting: Family Medicine

## 2016-08-18 DIAGNOSIS — J189 Pneumonia, unspecified organism: Secondary | ICD-10-CM

## 2016-08-18 HISTORY — DX: Pneumonia, unspecified organism: J18.9

## 2016-08-19 ENCOUNTER — Ambulatory Visit (INDEPENDENT_AMBULATORY_CARE_PROVIDER_SITE_OTHER): Payer: Medicare Other | Admitting: Podiatry

## 2016-08-19 ENCOUNTER — Encounter: Payer: Self-pay | Admitting: Podiatry

## 2016-08-19 DIAGNOSIS — M79676 Pain in unspecified toe(s): Secondary | ICD-10-CM | POA: Diagnosis not present

## 2016-08-19 DIAGNOSIS — S91209D Unspecified open wound of unspecified toe(s) with damage to nail, subsequent encounter: Secondary | ICD-10-CM | POA: Diagnosis not present

## 2016-08-19 DIAGNOSIS — S91109D Unspecified open wound of unspecified toe(s) without damage to nail, subsequent encounter: Secondary | ICD-10-CM

## 2016-08-22 ENCOUNTER — Other Ambulatory Visit: Payer: Self-pay | Admitting: Family Medicine

## 2016-08-22 DIAGNOSIS — K2100 Gastro-esophageal reflux disease with esophagitis, without bleeding: Secondary | ICD-10-CM

## 2016-08-27 NOTE — Progress Notes (Signed)
   Subjective: Patient presents today 2 weeks post ingrown nail permanent nail avulsion procedure. Patient states that the toe and nail fold is feeling much better.  Objective: Skin is warm, dry and supple. Nail and respective nail fold appears to be healing appropriately. Open wound to the associated nail fold with a granular wound base and moderate amount of fibrotic tissue. Minimal drainage noted. Mild erythema around the periungual region likely due to phenol chemical matricectomy.  Assessment: #1 postop permanent partial nail avulsion #2 open wound periungual nail fold of respective digit.   Plan of care: #1 patient was evaluated  #2 debridement of open wound was performed to the periungual border of the respective toe using a currette. Antibiotic ointment and Band-Aid was applied. #3 patient is to return to clinic on a PRN  basis.   Brent M. Evans, DPM [facility]  Dr. Brent M. Evans, DPM    [address]                                        [place], [place] [address]                Office [phone]  Fax [phone]      

## 2016-09-05 ENCOUNTER — Encounter: Payer: Self-pay | Admitting: Family Medicine

## 2016-09-05 ENCOUNTER — Ambulatory Visit (INDEPENDENT_AMBULATORY_CARE_PROVIDER_SITE_OTHER): Payer: Medicare Other | Admitting: Family Medicine

## 2016-09-05 VITALS — BP 136/64 | HR 120 | Temp 100.3°F | Resp 16 | Ht 61.5 in | Wt 153.0 lb

## 2016-09-05 DIAGNOSIS — J029 Acute pharyngitis, unspecified: Secondary | ICD-10-CM

## 2016-09-05 DIAGNOSIS — J069 Acute upper respiratory infection, unspecified: Secondary | ICD-10-CM | POA: Diagnosis not present

## 2016-09-05 DIAGNOSIS — B9789 Other viral agents as the cause of diseases classified elsewhere: Secondary | ICD-10-CM | POA: Diagnosis not present

## 2016-09-05 LAB — POCT INFLUENZA A/B
Influenza A, POC: NEGATIVE
Influenza B, POC: NEGATIVE

## 2016-09-05 LAB — POCT RAPID STREP A (OFFICE): Rapid Strep A Screen: NEGATIVE

## 2016-09-05 MED ORDER — BENZONATATE 100 MG PO CAPS: 100.0000 mg | ORAL_CAPSULE | Freq: Three times a day (TID) | ORAL | 0 refills | Status: DC | PRN

## 2016-09-05 MED ORDER — IPRATROPIUM BROMIDE 0.06 % NA SOLN: 2.0000 | Freq: Four times a day (QID) | NASAL | 0 refills | Status: DC

## 2016-09-05 NOTE — Progress Notes (Signed)
Subjective:    Patient ID: Mary Burke, female    DOB: 15-Sep-1945, 71 y.o.   MRN: [id number]  Mary Burke is a 71 y.o. female presenting on 09/05/2016 for Sore Throat (nasal congestion, bodyache, sore throat onset 2 days )  Patient presents for a same day appointment.  HPI  URI / PHARYNGITIS / FLU-LIKE ILLNESS Reports symptoms started about 2 days ago with URI symptoms nasal congestion and cough, then following day 2/18 woke up with severe sore throat, and felt more feverish, and did not go to church. Now within 24 hours worsening URI symptoms with thicker congestion, post nasal drainage, worsening sore throat. Sick contacts with grandchildren having similar URI symptoms but not confirmed diagnosis of strep or flu. She received influenza vaccine this season 04/2016. Doristine Mango regularly, and Mucinex - Continues to take chronic allergy medicines with Claritin, Singulair, and Flonase. She has history of pulmonary sarcoidosis and mild intermittent asthma - Admits some subjective fever with some sweats and occasional chills, some generalized body aches, mild bilateral ear pain and pressure and some sinus pressure - Currently with low-grade fever in office 100.86F, has not taken anti-pyretic today - Denies nausea, vomiting, abdominal pain, diarrhea, chest pain or tightness, shortness of breath, wheezing, coughing  Social History  Substance Use Topics  . Smoking status: Never Smoker  . Smokeless tobacco: Never Used  . Alcohol use No    Review of Systems Per HPI unless specifically indicated above     Objective:    BP 136/64   Pulse (!) 120   Temp 100.3 F (37.9 C) (Oral)   Resp 16   Ht 5' 1.5" (1.562 m)   Wt 153 lb (69.4 kg)   SpO2 99%   BMI 28.44 kg/m   Wt Readings from Last 3 Encounters:  09/05/16 153 lb (69.4 kg)  08/09/16 152 lb (68.9 kg)  02/08/16 146 lb (66.2 kg)    Physical Exam  Constitutional: She is oriented to person, place, and time. She  appears well-developed and well-nourished. No distress.  Mildly sick appearing but mostly still well, non toxic, comfortable, cooperative  HENT:  Head: Normocephalic and atraumatic.  Frontal / maxillary sinuses non-tender. Nares mostly patient with some turbinate edema and congestion without purulence. Bilateral TMs clear without erythema, effusion or bulging. Oropharynx moist mucus membranes clear without erythema, exudates, edema or asymmetry.  Eyes: Conjunctivae are normal. Right eye exhibits no discharge. Left eye exhibits no discharge.  Neck: Normal range of motion. Neck supple.  Cardiovascular: Regular rhythm, normal heart sounds and intact distal pulses.   No murmur heard. Tachycardic  Pulmonary/Chest: Effort normal and breath sounds normal. No respiratory distress. She has no wheezes. She has no rales.  Good air movement  Lymphadenopathy:    She has no cervical adenopathy.  Neurological: She is alert and oriented to person, place, and time.  Skin: Skin is warm and dry. No rash noted. She is not diaphoretic. No erythema.  Psychiatric: Her behavior is normal.  Nursing note and vitals reviewed.      Assessment & Plan:   Problem List Items Addressed This Visit    None    Visit Diagnoses    Viral pharyngitis    -  Primary - Strep negative. Not clinically consistent, low risk. Supportive care, see below    Relevant Orders   POCT Influenza A/B (Completed)   POCT rapid strep A (Completed)   Viral URI with cough  Consistent with viral URI x 2  days, with multiple known sick contacts grandchildren (similar URI symptoms). Afebrile, well hydrated on exam, no focal signs of infection (ears, throat, lungs clear). - Rapid flu negative, s/p influenza vaccine this season, no known flu contacts, discussion on possible empiric therapy with Tamiflu, decided against this for now  Plan: 1. Reassurance, likely self-limited with cough lasting up to few weeks. No antibiotics today. - Start  Atrovent nasal spray decongestant 2 sprays each nostril up to 4 times daily for 5-7 days, hold flonase for 1 week then may resume - Start [employer] take 1 capsule up to 3 times a day as needed for cough - May take OTC anti-histamine, Mucinex, supportive care warm herbal tea with honey, 2. Improve hydration 3. Return criteria given         Relevant Medications   ipratropium (ATROVENT) 0.06 % nasal spray   benzonatate (TESSALON) 100 MG capsule   Other Relevant Orders   POCT Influenza A/B (Completed)   POCT rapid strep A (Completed)      Meds ordered this encounter  Medications  . ipratropium (ATROVENT) 0.06 % nasal spray    Sig: Place 2 sprays into both nostrils 4 (four) times daily. For up to 5-7 days then stop.    Dispense:  15 mL    Refill:  0  . benzonatate (TESSALON) 100 MG capsule    Sig: Take 1 capsule (100 mg total) by mouth 3 (three) times daily as needed for cough.    Dispense:  30 capsule    Refill:  0      Follow up plan: Return in about 2 weeks (around 09/19/2016), or if symptoms worsen or fail to improve, for viral syndrome, URI, pharyngitis.  Nobie Putnam, [facility] 09/05/2016, 2:49 PM

## 2016-09-05 NOTE — Patient Instructions (Addendum)
Thank you for coming in to clinic today.  NEGATIVE Rapid strep, and NEGATIVE Rapid flu test. However, as discussed sometimes the flu test can be falsely negative.  1. It sounds most like a Viral Pharyngitis (Sore Throat) caused by a Virus - this will most likely run it's course in 5 to 10 days. Wash hands good to prevent spread of virus. - I do not see any evidence of Strep Throat today. No ear infection. Lungs are clear as well.  Hold Flonase for 1 week and Start Atrovent nasal spray decongestant 2 sprays in each nostril up to 4 times daily for 7 days Start Tessalon perls for cough as needed up to 3 times a day  - For Sore throat, continue to take Tylenol 500-1000mg  per dose every 6-8 hours (3 times a day) - Drink extra clear fluids (water, or G2 gatorade), try colder soft foods if needed otherwise regular diet - Drink warm herbal tea with honey to help reduce sore throat swelling - You can also try to cover for potential allergy symptoms that often make sore throat worse due to post-nasal drainage     - Try over the counter Nasal Saline spray (Simply Saline, Ocean Spray) as needed to reduce congestion     - Continue allergy medicine Loratadine (Claritin), Flonase, Singulair  Please schedule a follow-up appointment with Dr. Parks Ranger in 1 week if any worsening or change in symptoms Sore Throat, or concern for possible complication such as pneumonia if change of symptoms over next 1-2 weeks.  May call back sooner if you get worsening flu like symptoms, as discussed can phone in Tamiflu if needed.  If you have any other questions or concerns, please feel free to call the clinic or send a message through [place]. You may also schedule an earlier appointment if necessary.  Nobie Putnam, DO [place]

## 2016-09-08 ENCOUNTER — Telehealth: Payer: Self-pay | Admitting: Family Medicine

## 2016-09-08 DIAGNOSIS — J0110 Acute frontal sinusitis, unspecified: Secondary | ICD-10-CM

## 2016-09-08 MED ORDER — AMOXICILLIN-POT CLAVULANATE 875-125 MG PO TABS: 1.0000 | ORAL_TABLET | Freq: Two times a day (BID) | ORAL | 0 refills | Status: DC

## 2016-09-08 NOTE — Telephone Encounter (Signed)
Reviewed note from 2/19, now with persistent symptoms but worsening sinus pain pressure congestion cough, still fevers and muscle aches and feeling drained. Discussion that prior flu test negative and now day 5 into illness, less likely benefit from Tamiflu regardless. Mutual decision to start Augmentin BID for 10 days to cover for sinusitis for now, and likely viral illness if present will run it's course, if not improving over next 24-48 hours advised to seek more immediate attention at Urgent Care or ED.  Nobie Putnam, [facility] 09/08/2016, 12:31 PM

## 2016-09-08 NOTE — Telephone Encounter (Signed)
Pt has had a fever since visit on Monday.  It's been running over 100.  Her call back number is [phone].

## 2016-09-08 NOTE — Telephone Encounter (Signed)
Patient was seen on Monday still running fevers. Patient was tested for flu(neg) and strep (neg). Please advise.

## 2016-09-12 ENCOUNTER — Other Ambulatory Visit: Payer: Self-pay | Admitting: Family Medicine

## 2016-09-12 DIAGNOSIS — J069 Acute upper respiratory infection, unspecified: Secondary | ICD-10-CM

## 2016-09-12 DIAGNOSIS — B9789 Other viral agents as the cause of diseases classified elsewhere: Principal | ICD-10-CM

## 2016-09-21 ENCOUNTER — Other Ambulatory Visit: Payer: Self-pay | Admitting: Specialist

## 2016-09-21 DIAGNOSIS — D860 Sarcoidosis of lung: Secondary | ICD-10-CM

## 2016-09-23 ENCOUNTER — Other Ambulatory Visit: Payer: Self-pay | Admitting: Family Medicine

## 2016-09-23 DIAGNOSIS — B9789 Other viral agents as the cause of diseases classified elsewhere: Principal | ICD-10-CM

## 2016-09-23 DIAGNOSIS — J069 Acute upper respiratory infection, unspecified: Secondary | ICD-10-CM

## 2016-10-05 ENCOUNTER — Ambulatory Visit
Admission: RE | Admit: 2016-10-05 | Discharge: 2016-10-05 | Disposition: A | Discharge status: Home / Self Care | Payer: Medicare Other | Source: Ambulatory Visit | Attending: Specialist | Admitting: Specialist

## 2016-10-05 ENCOUNTER — Ambulatory Visit: Payer: Medicare Other

## 2016-10-05 DIAGNOSIS — I700 Atherosclerosis of aorta: Secondary | ICD-10-CM | POA: Diagnosis not present

## 2016-10-05 DIAGNOSIS — J984 Other disorders of lung: Secondary | ICD-10-CM | POA: Insufficient documentation

## 2016-10-05 DIAGNOSIS — N200 Calculus of kidney: Secondary | ICD-10-CM | POA: Diagnosis not present

## 2016-10-05 DIAGNOSIS — D860 Sarcoidosis of lung: Secondary | ICD-10-CM | POA: Insufficient documentation

## 2016-10-06 ENCOUNTER — Other Ambulatory Visit: Payer: Self-pay | Admitting: Family Medicine

## 2016-10-06 DIAGNOSIS — B9789 Other viral agents as the cause of diseases classified elsewhere: Principal | ICD-10-CM

## 2016-10-06 DIAGNOSIS — J069 Acute upper respiratory infection, unspecified: Secondary | ICD-10-CM

## 2016-10-07 ENCOUNTER — Other Ambulatory Visit: Payer: Self-pay | Admitting: Specialist

## 2016-10-07 DIAGNOSIS — R918 Other nonspecific abnormal finding of lung field: Secondary | ICD-10-CM

## 2016-10-13 ENCOUNTER — Other Ambulatory Visit: Payer: Self-pay | Admitting: Family Medicine

## 2016-10-13 DIAGNOSIS — B9789 Other viral agents as the cause of diseases classified elsewhere: Principal | ICD-10-CM

## 2016-10-13 DIAGNOSIS — J069 Acute upper respiratory infection, unspecified: Secondary | ICD-10-CM

## 2016-10-13 NOTE — Telephone Encounter (Signed)
Attempted to call patient, left voice mail to call back, regarding this medication, it was previously rx for viral URI symptoms acutely, and she should not need to use it chronically unless continues to have nasal congestion.  Declined refill, unless patient notifies [place] that she is actually requesting it.  Nobie Putnam, [facility] 10/13/2016, 1:38 PM

## 2016-10-31 ENCOUNTER — Ambulatory Visit (INDEPENDENT_AMBULATORY_CARE_PROVIDER_SITE_OTHER): Payer: Medicare Other | Admitting: Family Medicine

## 2016-10-31 ENCOUNTER — Encounter: Payer: Self-pay | Admitting: Family Medicine

## 2016-10-31 VITALS — BP 115/80 | HR 96 | Temp 98.6°F | Resp 16 | Ht 61.5 in | Wt 153.0 lb

## 2016-10-31 DIAGNOSIS — I10 Essential (primary) hypertension: Secondary | ICD-10-CM | POA: Diagnosis not present

## 2016-10-31 DIAGNOSIS — Z1159 Encounter for screening for other viral diseases: Secondary | ICD-10-CM

## 2016-10-31 DIAGNOSIS — E119 Type 2 diabetes mellitus without complications: Secondary | ICD-10-CM | POA: Diagnosis not present

## 2016-10-31 DIAGNOSIS — E785 Hyperlipidemia, unspecified: Secondary | ICD-10-CM | POA: Insufficient documentation

## 2016-10-31 DIAGNOSIS — Z0000 Encounter for general adult medical examination without abnormal findings: Secondary | ICD-10-CM

## 2016-10-31 DIAGNOSIS — D860 Sarcoidosis of lung: Secondary | ICD-10-CM | POA: Diagnosis not present

## 2016-10-31 DIAGNOSIS — K219 Gastro-esophageal reflux disease without esophagitis: Secondary | ICD-10-CM | POA: Diagnosis not present

## 2016-10-31 DIAGNOSIS — E782 Mixed hyperlipidemia: Secondary | ICD-10-CM | POA: Diagnosis not present

## 2016-10-31 DIAGNOSIS — E1169 Type 2 diabetes mellitus with other specified complication: Secondary | ICD-10-CM | POA: Insufficient documentation

## 2016-10-31 MED ORDER — OMEPRAZOLE 40 MG PO CPDR: 40.0000 mg | DELAYED_RELEASE_CAPSULE | Freq: Every day | ORAL | 3 refills | Status: DC

## 2016-10-31 NOTE — Assessment & Plan Note (Signed)
Stable, well controlled No complications Continue current regimen Follow-up q 6-12 months, monitor BP outside office

## 2016-10-31 NOTE — Assessment & Plan Note (Signed)
Stable chronic problem followed by [place] (Dr Raul Del), has been monitored with CT chest imaging, identified some abnormality, now following up with contrasted scan

## 2016-10-31 NOTE — Patient Instructions (Signed)
Thank you for coming in to clinic today.  1.  Keep up the good work  You will be due for [place] (no food or drink after midnight before, only water or coffee without cream/sugar on the morning of)  - Please go ahead and schedule a "Lab Only" visit in the morning at the clinic for lab draw within next few weeks  For Lab Results, once available within 2-3 days of blood draw, you can can log in to [place] online to view your results and a brief explanation. Also, we can discuss results at next follow-up visit.  Next Mammogram 11/2017  Next Pap Smear in 2 years approx 4-11/2018  Please schedule a follow-up appointment with Dr. Parks Ranger in 1 year for Annual Physical otherwise 6 months if need  If you have any other questions or concerns, please feel free to call the clinic or send a message through [place]. You may also schedule an earlier appointment if necessary.  Nobie Putnam, DO [place]
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
## 2016-10-31 NOTE — Assessment & Plan Note (Addendum)
Stable, controlled now on daily PPI omeprazole 40mg  daily, followed by [facility] - Refilled Omeprazole to change from 40 BID to 40 daily, sent to mail order (not due for actual med fill at this time, patient has extra)

## 2016-10-31 NOTE — Assessment & Plan Note (Signed)
Prior elevated LDL 115 in past, in setting of DM2 Not on statin therapy Check fasting lipids, then calculate ASCVD risk, already on ASA 81mg  daily, will review recommendations for statin with patient

## 2016-10-31 NOTE — Assessment & Plan Note (Signed)
Well-controlled. Last HgbA1c 6.0, stable from 5.8 No complications or hypoglycemia.  Plan:  1. Encouraged continue healthy lifestyle, diet controlled  2. Check A1c with labs 3. Continue ASA 81mg , ARB 4. Follow-up q 6-12 months for A1c, pending lab results

## 2016-11-14 ENCOUNTER — Ambulatory Visit
Admission: RE | Admit: 2016-11-14 | Discharge: 2016-11-14 | Disposition: A | Discharge status: Home / Self Care | Payer: Medicare Other | Source: Ambulatory Visit | Attending: Specialist | Admitting: Specialist

## 2016-11-14 DIAGNOSIS — J984 Other disorders of lung: Secondary | ICD-10-CM | POA: Insufficient documentation

## 2016-11-14 DIAGNOSIS — D869 Sarcoidosis, unspecified: Secondary | ICD-10-CM | POA: Insufficient documentation

## 2016-11-14 DIAGNOSIS — R918 Other nonspecific abnormal finding of lung field: Secondary | ICD-10-CM | POA: Insufficient documentation

## 2016-11-15 ENCOUNTER — Other Ambulatory Visit: Payer: Medicare Other

## 2016-11-15 ENCOUNTER — Other Ambulatory Visit: Payer: Self-pay | Admitting: Family Medicine

## 2016-11-15 LAB — CBC WITH DIFFERENTIAL/PLATELET
Basophils Absolute: 100 cells/uL (ref 0–200)
Basophils Relative: 2 %
Eosinophils Absolute: 200 cells/uL (ref 15–500)
Eosinophils Relative: 4 %
HCT: 42.2 % (ref 35.0–45.0)
Hemoglobin: 13.4 g/dL (ref 11.7–15.5)
Lymphocytes Relative: 30 %
Lymphs Abs: 1500 cells/uL (ref 850–3900)
MCH: 28.9 pg (ref 27.0–33.0)
MCHC: 31.8 g/dL — ABNORMAL LOW (ref 32.0–36.0)
MCV: 90.9 fL (ref 80.0–100.0)
MPV: 9.9 fL (ref 7.5–12.5)
Monocytes Absolute: 550 cells/uL (ref 200–950)
Monocytes Relative: 11 %
Neutro Abs: 2650 cells/uL (ref 1500–7800)
Neutrophils Relative %: 53 %
Platelets: 322 10*3/uL (ref 140–400)
RBC: 4.64 MIL/uL (ref 3.80–5.10)
RDW: 14.4 % (ref 11.0–15.0)
WBC: 5 10*3/uL (ref 3.8–10.8)

## 2016-11-16 ENCOUNTER — Other Ambulatory Visit: Payer: Self-pay | Admitting: Specialist

## 2016-11-16 DIAGNOSIS — R918 Other nonspecific abnormal finding of lung field: Secondary | ICD-10-CM

## 2016-11-16 LAB — COMPLETE METABOLIC PANEL WITH GFR
ALT: 14 U/L (ref 6–29)
AST: 20 U/L (ref 10–35)
Albumin: 3.9 g/dL (ref 3.6–5.1)
Alkaline Phosphatase: 47 U/L (ref 33–130)
BUN: 11 mg/dL (ref 7–25)
CO2: 23 mmol/L (ref 20–31)
Calcium: 9 mg/dL (ref 8.6–10.4)
Chloride: 106 mmol/L (ref 98–110)
Creat: 0.67 mg/dL (ref 0.50–0.99)
GFR, Est African American: >89 mL/min (ref >=60)
GFR, Est Non African American: >89 mL/min (ref >=60)
Glucose, Bld: 84 mg/dL (ref 65–99)
Potassium: 3.9 mmol/L (ref 3.5–5.3)
Sodium: 142 mmol/L (ref 135–146)
Total Bilirubin: 0.3 mg/dL (ref 0.2–1.2)
Total Protein: 6.6 g/dL (ref 6.1–8.1)

## 2016-11-16 LAB — LIPID PANEL
Cholesterol: 179 mg/dL (ref <200)
HDL: 66 mg/dL (ref >50)
LDL Cholesterol: 81 mg/dL (ref <100)
Total CHOL/HDL Ratio: 2.7 Ratio (ref <5.0)
Triglycerides: 158 mg/dL — ABNORMAL HIGH (ref <150)
VLDL: 32 mg/dL — ABNORMAL HIGH (ref <30)

## 2016-11-16 LAB — HEPATITIS C ANTIBODY: HCV Ab: NEGATIVE

## 2016-11-16 LAB — HEMOGLOBIN A1C
Hgb A1c MFr Bld: 5.3 % (ref <5.7)
Mean Plasma Glucose: 105 mg/dL

## 2016-11-17 ENCOUNTER — Other Ambulatory Visit: Payer: Self-pay

## 2016-11-17 DIAGNOSIS — E785 Hyperlipidemia, unspecified: Secondary | ICD-10-CM

## 2016-11-17 MED ORDER — ROSUVASTATIN CALCIUM 5 MG PO TABS: 5.0000 mg | ORAL_TABLET | Freq: Every day | ORAL | 3 refills | Status: DC

## 2016-11-17 NOTE — Telephone Encounter (Signed)
Patient advised as below. Patient verbalizes understanding and is in agreement with treatment plan. Patient request to send in Crestor to [facility]

## 2016-11-17 NOTE — Telephone Encounter (Signed)
-----   Message from Olin Hauser, DO sent at 11/17/2016  7:09 AM EDT ----- Lab results released to MyChart with comments for patient. Following annual physical 10/2016.  1. Chemistry panel - Normal electrolytes, kidney and liver function. 2. Hemoglobin A1c - 5.3, excellent result, very well controlled Diabetes on lifestyle. 3. Routine Hepatitis C Screen - Negative. 4. CBC - Normal result, no anemia or abnormal blood cells. 5. Cholesterol (Lipid) Panel - Mostly normal results, except mild elevated Triglyceride. Good and Bad cholesterol were normal.    - Based on these results and history of Diabetes, High Blood Pressure, still meet criteria to start a Statin Cholesterol medication as discussed, would recommend a very low dose med such as Rosuvastatin (Crestor) 5mg  nightly, potential side effects are muscle aches / joint pain, if occur should be mild at this dose, notify me if interested in starting this, can phone in to pharmacy.  Follow-up 1 year for Annual Physical, sooner if needed.  Nobie Putnam, [facility] 11/17/2016, 7:09 AM

## 2016-11-21 ENCOUNTER — Other Ambulatory Visit: Payer: Self-pay

## 2016-11-21 MED ORDER — RALOXIFENE HCL 60 MG PO TABS: 60.0000 mg | ORAL_TABLET | Freq: Every day | ORAL | 6 refills | Status: DC

## 2016-11-21 NOTE — Telephone Encounter (Signed)
Pharmacy requesting refill Last ov  10/31/16 Last filled 01/06/16

## 2016-11-22 ENCOUNTER — Ambulatory Visit: Payer: Medicare Other

## 2016-11-24 ENCOUNTER — Ambulatory Visit
Admission: RE | Admit: 2016-11-24 | Discharge: 2016-11-24 | Disposition: A | Discharge status: Home / Self Care | Payer: Medicare Other | Source: Ambulatory Visit | Attending: Specialist | Admitting: Specialist

## 2016-11-24 DIAGNOSIS — D71 Functional disorders of polymorphonuclear neutrophils: Secondary | ICD-10-CM | POA: Diagnosis not present

## 2016-11-24 DIAGNOSIS — I700 Atherosclerosis of aorta: Secondary | ICD-10-CM | POA: Diagnosis not present

## 2016-11-24 DIAGNOSIS — K8020 Calculus of gallbladder without cholecystitis without obstruction: Secondary | ICD-10-CM | POA: Diagnosis not present

## 2016-11-24 DIAGNOSIS — R918 Other nonspecific abnormal finding of lung field: Secondary | ICD-10-CM | POA: Insufficient documentation

## 2016-11-24 LAB — GLUCOSE, CAPILLARY: Glucose-Capillary: 98 mg/dL (ref 65–99)

## 2016-11-24 MED ORDER — FLUDEOXYGLUCOSE F - 18 (FDG) INJECTION
12.7100 | Freq: Once | INTRAVENOUS | Status: AC | PRN
  Administered 2016-11-24: 12.71 via INTRAVENOUS

## 2016-12-08 ENCOUNTER — Ambulatory Visit (INDEPENDENT_AMBULATORY_CARE_PROVIDER_SITE_OTHER): Payer: Medicare Other | Admitting: Cardiothoracic Surgery

## 2016-12-08 ENCOUNTER — Encounter: Payer: Self-pay | Admitting: Cardiothoracic Surgery

## 2016-12-08 VITALS — BP 127/79 | HR 92 | Temp 97.8°F | Ht 60.0 in | Wt 159.0 lb

## 2016-12-08 DIAGNOSIS — R918 Other nonspecific abnormal finding of lung field: Secondary | ICD-10-CM | POA: Diagnosis not present

## 2016-12-08 NOTE — Patient Instructions (Addendum)
I will be contacting you to let you know about your referral to see Dr. Mortimer Fries (Pulmonologist).
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
## 2016-12-09 ENCOUNTER — Ambulatory Visit: Payer: Medicare Other | Admitting: Cardiothoracic Surgery

## 2017-01-06 ENCOUNTER — Encounter: Payer: Self-pay | Admitting: Internal Medicine

## 2017-01-06 ENCOUNTER — Ambulatory Visit (INDEPENDENT_AMBULATORY_CARE_PROVIDER_SITE_OTHER): Payer: Medicare Other | Admitting: Internal Medicine

## 2017-01-06 VITALS — BP 118/70 | HR 93 | Resp 16 | Ht 60.0 in | Wt 168.0 lb

## 2017-01-06 DIAGNOSIS — R918 Other nonspecific abnormal finding of lung field: Secondary | ICD-10-CM
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
## 2017-01-06 NOTE — Patient Instructions (Signed)
Please set up for ENB

## 2017-01-16 ENCOUNTER — Encounter: Payer: Self-pay | Admitting: Internal Medicine

## 2017-01-19 ENCOUNTER — Other Ambulatory Visit: Payer: Self-pay

## 2017-01-19 MED ORDER — LOSARTAN POTASSIUM 25 MG PO TABS: ORAL_TABLET | ORAL | 5 refills | Status: DC

## 2017-01-30 ENCOUNTER — Encounter
Admission: RE | Admit: 2017-01-30 | Discharge: 2017-01-30 | Disposition: A | Discharge status: Home / Self Care | Payer: Medicare Other | Source: Ambulatory Visit | Attending: Internal Medicine | Admitting: Internal Medicine

## 2017-01-30 DIAGNOSIS — I10 Essential (primary) hypertension: Secondary | ICD-10-CM | POA: Insufficient documentation

## 2017-01-30 HISTORY — DX: Type 2 diabetes mellitus without complications: E11.9

## 2017-01-30 HISTORY — DX: Unspecified osteoarthritis, unspecified site: M19.90

## 2017-01-30 NOTE — Patient Instructions (Signed)
  Your procedure is scheduled on: 02-08-17 [facility] Report to Same Day Surgery 2nd floor medical mall [facility] Entrance-take elevator on left to 2nd floor.  Check in with surgery information desk.) To find out your arrival time please call [phone] between 1PM - 3PM on 02-07-17 TUESDAY  Remember: Instructions that are not followed completely may result in serious medical risk, up to and including death, or upon the discretion of your surgeon and anesthesiologist your surgery may need to be rescheduled.    _x___ 1. Do not eat food or drink liquids after midnight. No gum chewing or hard candies.     __x__ 2. No Alcohol for 24 hours before or after surgery.   __x__3. No Smoking for 24 prior to surgery.   ____  4. Bring all medications with you on the day of surgery if instructed.    __x__ 5. Notify your doctor if there is any change in your medical condition     (cold, fever, infections).     Do not wear jewelry, make-up, hairpins, clips or nail polish.  Do not wear lotions, powders, or perfumes. You may wear deodorant.  Do not shave 48 hours prior to surgery. Men may shave face and neck.  Do not bring valuables to the hospital.    [facility] is not responsible for any belongings or valuables.               Contacts, dentures or bridgework may not be worn into surgery.  Leave your suitcase in the car. After surgery it may be brought to your room.  For patients admitted to the hospital, discharge time is determined by your treatment team.   Patients discharged the day of surgery will not be allowed to drive home.  You will need someone to drive you home and stay with you the night of your procedure.    Please read over the following fact sheets that you were given:     _x___ [place] WITH A SMALL SIP OF WATER. These include:  1. LOSARTAN (COZAAR)  2. PRILOSEC (OMEPRAZOLE)  3.  4.  5.  6.  ____Fleets enema or Magnesium Citrate  as directed.   _x___ Use CHG Soap or sage wipes as directed on instruction sheet   _X___ Use inhalers on the day of surgery and bring to hospital day of surgery-USE ALBUTEROL [place]  ____ Stop Metformin and Janumet 2 days prior to surgery.    ____ Take 1/2 of usual insulin dose the night before surgery and none on the morning surgery.   _x___ Follow recommendations from Cardiologist, Pulmonologist or PCP regarding stopping Aspirin, Coumadin, Pllavix ,Eliquis, Effient, or Pradaxa, and Pletal-PT TO STOP ASPIRIN 5 DAYS PRIOR TO SURGERY PER DR KASA  X____Stop Anti-inflammatories such as Advil, Aleve, Ibuprofen, Motrin, Naproxen, Naprosyn, Goodies powders or aspirin products 7 DAYS PRIOR- OK to take Tylenol    _x___ Stop supplements until after surgery-STOP VITAMIN E AND GLUCOSAMINE-CHONDROITIN NOW-MAY RESUME AFTER SURGERY   ____ Bring C-Pap to the hospital.

## 2017-01-30 NOTE — Pre-Procedure Instructions (Signed)
NM myocardial perfusion SPECT multiple (stress and rest)09/24/2015 [place] Result Impression   Normal myocardial perfusion scan no evidence of stress-induced  myocardial ischemia ejection fraction of 67% conclusion normal scan  Result Narrative  Procedure: Exercise Myocardial Perfusion Imaging ONE day procedure  Indication: SOB (shortness of breath) - Plan: NM myocardial perfusion  SPECT multiple (stress and rest), ECG stress test only  Angina at rest (CMS-HCC) - Plan: NM myocardial perfusion SPECT multiple  (stress and rest), ECG stress test only Ordering Physician:   Dr. Lujean Amel   Clinical History: 71 y.o. year old female recent anginal symptoms Vitals: Height: 62 inWeight: 168 lb Cardiac risk factors include:  Diabetes and HTN    Procedure: The patient performed treadmill exercise using a Bruce protocol for 9:00  minutes. The exercise test was stopped due to fatigue.Blood pressure  response was hypertensive.   Rest HR: 76bpm Rest BP: 132/14mmHg Max HR: 171bpm Max BP: 210/44mmHg Mets: 10.40 % MAX HR: 111%  Stress Test Administered by: Oswald Hillock, CMA  ECG Interpretation: Rest OIL:[id number] sinus rhythm, RBBB Stress ASU:ORVIF tachycardia, RBBB Recovery BPP:HKFEXM sinus rhythm ECG Interpretation:negative, nondiagnostic changes.   Administrations This Visit  technetium Tc93m sestamibi (CARDIOLITE) injection 14.709 millicurie  Admin Date Action Dose Route Administered By      09/24/2015 Given 29.574 millicurie Intravenous Vonna Kotyk B Hyler, CNMT      technetium Tc67m sestamibi (CARDIOLITE) injection 73.40 millicurie  Admin Date Action Dose Route Administered By      37/03/6437 Given 38.18 millicurie Intravenous Herbert Seta, CNMT

## 2017-01-30 NOTE — Pre-Procedure Instructions (Signed)
Echocardiogram 2D complete3/03/2016 [place] Component Name Value Ref Range  LV Ejection Fraction (%) 55   Aortic Valve Stenosis Grade none   Aortic Valve Regurgitation Grade none   Aortic Valve Max Velocity (m/s) 1.4 m/sec   Aortic Valve Stenosis Mean Gradient (mmHg) 4.0 mmHg   Mitral Valve Regurgitation Grade mild   Mitral Valve Stenosis Grade none   Tricuspid Valve Regurgitation Grade trivial   LV End Diastolic Diameter (cm) 4.3 cm  LV End Systolic Diameter (cm) 3.3 cm  LV Septum Wall Thickness (cm) 0.84 cm  LV Posterior Wall Thickness (cm) 0.74 cm  Left Atrium Diameter (cm) 3.4 cm  Result Narrative  [id number]  [facility] [id number]  [address] [place], [place] [address]Date: 09/24/2015 09:18 AM  Adult Female Age: 71 yrs ECHOCARDIOGRAM REPORTOutpatient  KC::KCWC STUDY:CHEST WALL TAPE:0000:00: 0:00:00 MD1:CALLWOOD, DWAYNE DENNIS  ECHO:Yes DOPPLER:YesFILE:0000-000-000 BP: 118/68 mmHg COLOR:YesCONTRAST:No MACHINE:[place] Height: 62 in RV BIOPSY:No 3D:NoSOUND QLTY:ModerateWeight: 168 lb  MEDIUM:None BSA: 1.8 m2  ___________________________________________________________________________________________  HISTORY:DOE, Chest pain REASON:Assess INDICATION:I20.8 Other forms of angina pectoris, R06.02 Shortness of breath  ___________________________________________________________________________________________ ECHOCARDIOGRAPHIC  MEASUREMENTS 2D DIMENSIONS AORTA ValuesNormal RangeMAIN PAValuesNormal Range Annulus:nm* [2.1 - 2.5]PA Main:nm* [1.5 - 2.1] Aorta Sin:nm* [2.7 - 3.3] RIGHT VENTRICLE ST Junction:nm* [2.3 - 2.9]RV Base:nm* [ < 4.2] Asc.Aorta:nm* [2.3 - 3.1] RV Mid:nm* [ < 3.5]  LEFT VENTRICLERV Length:nm* [ < 8.6] LVIDd:4.3 cm[3.9 - 5.3] INFERIOR VENA CAVA LVIDs:3.3 cmMax. IVC:nm* [ <= 2.1]  FS:22.1 %[> 25]Min. IVC:nm* SWT:0.84 cm [0.5 - 0.9] ------------------ PWT:0.74 cm [0.5 - 0.9] nm* - not measured  LEFT ATRIUM LA Diam:3.4 cm[2.7 - 3.8] LA A4C Area:nm* [ < 20] LA Volume:nm* [22 - 52]  ___________________________________________________________________________________________ ECHOCARDIOGRAPHIC DESCRIPTIONS  AORTIC ROOT Size:Normal Dissection:INDETERM FOR DISSECTION  AORTIC VALVE Leaflets:Tricuspid Morphology:Normal Mobility:Fully mobile  LEFT VENTRICLE Size:NormalAnterior:Normal  Contraction:Normal Lateral:Normal Closest EF:>55% (Estimated)Septal:Normal  LV Masses:No Masses Apical:Normal  EAV:[id number]:NFAOZH Posterior:Normal Dias.FxClass:(Grade 1) relaxation abnormal, E/A reversal  MITRAL VALVE Leaflets:NormalMobility:Fully mobile Morphology:Normal  LEFT ATRIUM Size:Normal LA Masses:No masses  IA Septum:Normal IAS  MAIN  PA Size:Normal  PULMONIC VALVE Morphology:NormalMobility:Fully mobile  RIGHT VENTRICLE  RV Masses:No Masses Size:Normal  Free Wall:Normal Contraction:Normal  TRICUSPID VALVE Leaflets:NormalMobility:Fully mobile Morphology:Normal  RIGHT ATRIUM Size:NormalRA Other:None  RA Mass:No masses  PERICARDIUM  Fluid:No effusion  INFERIOR VENACAVA Size:Normal Normal respiratory collapse   ____________________________________________________________________ DOPPLER ECHO and OTHER SPECIAL PROCEDURES  Aortic:No AR No AS 144.0 cm/sec peak vel 8.3 mmHg peak grad 4.0 mmHg mean grad1.7 cm^2 by DOPPLER   Mitral:MILD MR No MS 5.1 cm^2 by DOPPLER MV Inflow E Vel=83.9 cm/sec MV Annulus E'Vel=9.3 cm/sec E/E'Ratio=9.0  Tricuspid:TRIVIAL TRNo TS  Pulmonary:TRIVIAL PRNo PS     ___________________________________________________________________________________________ INTERPRETATION NORMAL LEFT VENTRICULAR SYSTOLIC FUNCTION WITH AN ESTIMATED EF = 55 % NORMAL RIGHT VENTRICULAR SYSTOLIC FUNCTION MILD MITRAL VALVE INSUFFICIENCY TRACE TRICUSPID VALVE INSUFFICIENCY NO VALVULAR STENOSIS   ___________________________________________________________________________________________ Electronically signed by: Lujean Amel, MD on 09/24/2015 04:21 PM Performed By: Johnathan Hausen, RDCS, RVT Ordering Physician: Lujean Amel ___________________________________________________________________________________________  Status Results Details    Appointment on 09/24/2015 [place]")'  href="[email or web address] Summary

## 2017-01-31 ENCOUNTER — Encounter
Admission: RE | Admit: 2017-01-31 | Discharge: 2017-01-31 | Disposition: A | Discharge status: Home / Self Care | Payer: Medicare Other | Source: Ambulatory Visit | Attending: Internal Medicine | Admitting: Internal Medicine

## 2017-01-31 DIAGNOSIS — I10 Essential (primary) hypertension: Secondary | ICD-10-CM | POA: Diagnosis not present

## 2017-02-07 ENCOUNTER — Encounter: Payer: Self-pay | Admitting: *Deleted
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
## 2017-02-08 ENCOUNTER — Ambulatory Visit
Admission: RE | Admit: 2017-02-08 | Discharge: 2017-02-08 | Disposition: A | Discharge status: Home / Self Care | Payer: Medicare Other | Source: Ambulatory Visit | Attending: Internal Medicine | Admitting: Internal Medicine

## 2017-02-08 ENCOUNTER — Ambulatory Visit: Payer: Medicare Other | Admitting: Anesthesiology

## 2017-02-08 ENCOUNTER — Ambulatory Visit: Payer: Medicare Other

## 2017-02-08 ENCOUNTER — Encounter: Payer: Self-pay | Admitting: *Deleted

## 2017-02-08 ENCOUNTER — Encounter
Admission: RE | Disposition: A | Discharge status: Home / Self Care | Payer: Self-pay | Source: Ambulatory Visit | Attending: Internal Medicine

## 2017-02-08 DIAGNOSIS — Z7982 Long term (current) use of aspirin: Secondary | ICD-10-CM | POA: Insufficient documentation

## 2017-02-08 DIAGNOSIS — D869 Sarcoidosis, unspecified: Secondary | ICD-10-CM | POA: Insufficient documentation

## 2017-02-08 DIAGNOSIS — R918 Other nonspecific abnormal finding of lung field: Secondary | ICD-10-CM

## 2017-02-08 DIAGNOSIS — J45909 Unspecified asthma, uncomplicated: Secondary | ICD-10-CM | POA: Diagnosis not present

## 2017-02-08 DIAGNOSIS — I10 Essential (primary) hypertension: Secondary | ICD-10-CM | POA: Diagnosis not present

## 2017-02-08 DIAGNOSIS — E119 Type 2 diabetes mellitus without complications: Secondary | ICD-10-CM | POA: Insufficient documentation

## 2017-02-08 DIAGNOSIS — Z79899 Other long term (current) drug therapy: Secondary | ICD-10-CM | POA: Insufficient documentation

## 2017-02-08 DIAGNOSIS — Z7951 Long term (current) use of inhaled steroids: Secondary | ICD-10-CM | POA: Diagnosis not present

## 2017-02-08 DIAGNOSIS — Z8701 Personal history of pneumonia (recurrent): Secondary | ICD-10-CM | POA: Diagnosis not present

## 2017-02-08 DIAGNOSIS — K219 Gastro-esophageal reflux disease without esophagitis: Secondary | ICD-10-CM | POA: Diagnosis not present

## 2017-02-08 HISTORY — PX: ELECTROMAGNETIC NAVIGATION BROCHOSCOPY: SHX5369

## 2017-02-08 SURGERY — ELECTROMAGNETIC NAVIGATION BRONCHOSCOPY
Anesthesia: General

## 2017-02-08 MED ORDER — LACTATED RINGERS IV SOLN
INTRAVENOUS | Status: DC
  Administered 2017-02-08: 13:00:00 via INTRAVENOUS

## 2017-02-08 MED ORDER — SUCCINYLCHOLINE CHLORIDE 20 MG/ML IJ SOLN
INTRAMUSCULAR | Status: DC | PRN
  Administered 2017-02-08: 100 mg via INTRAVENOUS

## 2017-02-08 MED ORDER — LIDOCAINE HCL 2 % EX GEL: 1.0000 "application " | Freq: Once | CUTANEOUS | Status: DC

## 2017-02-08 MED ORDER — IPRATROPIUM-ALBUTEROL 0.5-2.5 (3) MG/3ML IN SOLN
3.0000 mL | Freq: Once | RESPIRATORY_TRACT | Status: AC
  Administered 2017-02-08: 3 mL via RESPIRATORY_TRACT

## 2017-02-08 MED ORDER — FENTANYL CITRATE (PF) 100 MCG/2ML IJ SOLN
INTRAMUSCULAR | Status: DC | PRN
  Administered 2017-02-08: 100 ug via INTRAVENOUS

## 2017-02-08 MED ORDER — MIDAZOLAM HCL 2 MG/2ML IJ SOLN
INTRAMUSCULAR | Status: AC
  Filled 2017-02-08: qty 2

## 2017-02-08 MED ORDER — OXYCODONE HCL 5 MG PO TABS
ORAL_TABLET | ORAL | Status: DC
Start: 2017-02-08 — End: 2017-02-08
  Filled 2017-02-08: qty 1

## 2017-02-08 MED ORDER — SUGAMMADEX SODIUM 200 MG/2ML IV SOLN
INTRAVENOUS | Status: AC
  Filled 2017-02-08: qty 2

## 2017-02-08 MED ORDER — ROCURONIUM BROMIDE 100 MG/10ML IV SOLN
INTRAVENOUS | Status: DC | PRN
  Administered 2017-02-08: 5 mg via INTRAVENOUS
  Administered 2017-02-08: 10 mg via INTRAVENOUS

## 2017-02-08 MED ORDER — OXYCODONE HCL 5 MG/5ML PO SOLN: 5.0000 mg | Freq: Once | ORAL | Status: AC | PRN

## 2017-02-08 MED ORDER — FENTANYL CITRATE (PF) 100 MCG/2ML IJ SOLN
INTRAMUSCULAR | Status: AC
  Filled 2017-02-08: qty 2

## 2017-02-08 MED ORDER — MIDAZOLAM HCL 5 MG/5ML IJ SOLN
INTRAMUSCULAR | Status: DC | PRN
  Administered 2017-02-08: 2 mg via INTRAVENOUS

## 2017-02-08 MED ORDER — FENTANYL CITRATE (PF) 100 MCG/2ML IJ SOLN
25.0000 ug | INTRAMUSCULAR | Status: DC | PRN
Start: 2017-02-08 — End: 2017-02-08

## 2017-02-08 MED ORDER — PROPOFOL 10 MG/ML IV BOLUS
INTRAVENOUS | Status: AC
  Filled 2017-02-08: qty 20

## 2017-02-08 MED ORDER — ONDANSETRON HCL 4 MG/2ML IJ SOLN
INTRAMUSCULAR | Status: DC | PRN
  Administered 2017-02-08: 4 mg via INTRAVENOUS

## 2017-02-08 MED ORDER — DEXAMETHASONE SODIUM PHOSPHATE 10 MG/ML IJ SOLN
INTRAMUSCULAR | Status: DC | PRN
  Administered 2017-02-08: 5 mg via INTRAVENOUS

## 2017-02-08 MED ORDER — ONDANSETRON HCL 4 MG/2ML IJ SOLN
INTRAMUSCULAR | Status: AC
  Filled 2017-02-08: qty 2

## 2017-02-08 MED ORDER — ROCURONIUM BROMIDE 50 MG/5ML IV SOLN
INTRAVENOUS | Status: AC
Start: 2017-02-08 — End: 2017-02-08
  Filled 2017-02-08: qty 1

## 2017-02-08 MED ORDER — SUGAMMADEX SODIUM 200 MG/2ML IV SOLN
INTRAVENOUS | Status: DC | PRN
  Administered 2017-02-08: 150 mg via INTRAVENOUS

## 2017-02-08 MED ORDER — DEXAMETHASONE SODIUM PHOSPHATE 10 MG/ML IJ SOLN
INTRAMUSCULAR | Status: AC
Start: 2017-02-08 — End: 2017-02-08
  Filled 2017-02-08: qty 1

## 2017-02-08 MED ORDER — LIDOCAINE HCL (CARDIAC) 20 MG/ML IV SOLN
INTRAVENOUS | Status: DC | PRN
  Administered 2017-02-08: 60 mg via INTRAVENOUS

## 2017-02-08 MED ORDER — SUCCINYLCHOLINE CHLORIDE 20 MG/ML IJ SOLN
INTRAMUSCULAR | Status: AC
  Filled 2017-02-08: qty 1

## 2017-02-08 MED ORDER — OXYCODONE HCL 5 MG PO TABS
5.0000 mg | ORAL_TABLET | Freq: Once | ORAL | Status: AC | PRN
  Administered 2017-02-08: 5 mg via ORAL

## 2017-02-08 MED ORDER — IPRATROPIUM-ALBUTEROL 0.5-2.5 (3) MG/3ML IN SOLN
RESPIRATORY_TRACT | Status: DC
Start: 2017-02-08 — End: 2017-02-08
  Filled 2017-02-08: qty 3

## 2017-02-08 NOTE — Discharge Instructions (Signed)

## 2017-02-08 NOTE — Transfer of Care (Signed)
Immediate Anesthesia Transfer of Care Note  Patient: Mary Burke  Procedure(s) Performed: Procedure(s): ELECTROMAGNETIC NAVIGATION BRONCHOSCOPY (N/A)  Patient Location: PACU  Anesthesia Type:General  Level of Consciousness: sedated  Airway & Oxygen Therapy: Patient Spontanous Breathing and Patient connected to face mask oxygen  Post-op Assessment: Report given to RN and Post -op Vital signs reviewed and stable  Post vital signs: Reviewed and stable  Last Vitals:  Vitals:   02/08/17 1237 02/08/17 1415  BP: (!) 138/54 136/71  Pulse: 98 (!) 106  Resp: 16 12  Temp: 37.1 C 36.5 C    Last Pain:  Vitals:   02/08/17 1237  TempSrc: Oral         Complications: No apparent anesthesia complications

## 2017-02-08 NOTE — Anesthesia Post-op Follow-up Note (Cosign Needed)
Anesthesia QCDR form completed.        

## 2017-02-08 NOTE — Anesthesia Postprocedure Evaluation (Signed)
Anesthesia Post Note  Patient: Mary Burke  Procedure(s) Performed: Procedure(s) (LRB): ELECTROMAGNETIC NAVIGATION BRONCHOSCOPY (N/A)  Patient location during evaluation: PACU Anesthesia Type: General Level of consciousness: awake and alert Pain management: pain level controlled Vital Signs Assessment: post-procedure vital signs reviewed and stable Respiratory status: spontaneous breathing, nonlabored ventilation, respiratory function stable and patient connected to nasal cannula oxygen Cardiovascular status: blood pressure returned to baseline and stable Postop Assessment: no signs of nausea or vomiting Anesthetic complications: no     Last Vitals:  Vitals:   02/08/17 1445 02/08/17 1452  BP: 133/61 139/74  Pulse:  93  Resp:  16  Temp:  (!) 35.7 C    Last Pain:  Vitals:   02/08/17 1452  TempSrc: Temporal  PainSc: 6                  Precious Haws Piscitello
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
## 2017-02-08 NOTE — Op Note (Signed)
Electromagnetic Navigation Bronchoscopy: Indication: lung mass  Preoperative Diagnosis:lung nodule/mass Post Procedure Diagnosis:lung nodule/mass Consent: Verbal/Written  The Risks and Benefits of the procedure explained to patient/family prior to start of procedure and I have discussed the risk for acute bleeding, increased chance of infection, increased chance of respiratory failure and cardiac arrest and death.  I have also explained to avoid all types of NSAIDs to decrease chance of bleeding, and to avoid food and drinks the midnight prior to procedure.  The procedure consists of a video camera with a light source to be placed and inserted  into the lungs to  look for abnormal tissue and to obtain tissue samples by using needle and biopsy tools.  The patient/family understand the risks and benefits and have agreed to proceed with procedure.   Hand washing performed prior to starting the procedure.   Type of Anesthesia: see Anesthesiology records .   Procedure Performed:  Virtual Bronchoscopy with Multi-planar Image analysis, 3-D reconstruction of coronal, sagittal and multi-planar images for the purposes of planning real-time bronchoscopy using the i[employer] Electromagnetic Navigation Bronchoscopy System (superDimension).  Description of Procedure: After obtaining informed consent from the patient, the above sedative and anesthetic measures were carried out, flexible fiberoptic bronchoscope was inserted via Endotracheal tube after patient was intubated by CNA/Anesthesiologist.   The virtual camera was then placed into the central portion of the trachea. The trachea itself was inspected.  The main carina, right and left midstem bronchus and all the segmental and subsegmental airways by virtual bronchoscopy were inspected. The camera was directed to standard registration points at the following centers: main carina, right upper lobe bronchus, right lower lobe bronchus, right middle lobe  bronchus, left upper lobe bronchus, and the left lower lobe bronchus. This data was transferred to the i-[employer] ENB system for real-time bronchoscopy.   The scope was then navigated to the RUL for tissue sampling  Specimans Obtained:  Transbronchial Fine Needle Aspirations 5  Transbronchial Forceps Biopsy times 6  Fluoroscopy:  Fluoroscopy was utilized during the course of this procedure to assure that biopsies were taken in a safe manner under fluoroscopic guidance with spot films required.   Complications:None  Estimated Blood Loss: minimal approx 1cc  Monitoring:  The patient was monitored with continuous oximetry and received supplemental nasal cannula oxygen throughout the procedure. In addition, serial blood pressure measurements and continuous electrocardiography showed these physiologic parameters to remain tolerable throughout the procedure.   Assessment and Plan/Additional Comments: Follow up Pathology Reports    Corrin Parker, M.D.  [facility]  Medical Director [place] Director [facility] Cardio-Pulmonary Department
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
## 2017-02-08 NOTE — Anesthesia Procedure Notes (Signed)
Procedure Name: Intubation Date/Time: 02/08/2017 1:18 PM Performed by: Dionne Bucy Pre-anesthesia Checklist: Patient identified, Patient being monitored, Timeout performed, Emergency Drugs available and Suction available Patient Re-evaluated:Patient Re-evaluated prior to induction Oxygen Delivery Method: Circle system utilized Preoxygenation: Pre-oxygenation with 100% oxygen Induction Type: IV induction Ventilation: Mask ventilation without difficulty Laryngoscope Size: 3 and McGraph Grade View: Grade I Tube type: Oral Tube size: 8.0 mm Number of attempts: 1 Airway Equipment and Method: Stylet and Video-laryngoscopy Placement Confirmation: ETT inserted through vocal cords under direct vision,  positive ETCO2 and breath sounds checked- equal and bilateral Secured at: 21 cm Tube secured with: Tape Dental Injury: Teeth and Oropharynx as per pre-operative assessment  Comments: Neutral c-spine position maintained throughout laryngoscopy.

## 2017-02-09 ENCOUNTER — Encounter: Payer: Self-pay | Admitting: Internal Medicine

## 2017-02-09 ENCOUNTER — Telehealth: Payer: Self-pay | Admitting: Internal Medicine

## 2017-02-09 LAB — SURGICAL PATHOLOGY

## 2017-02-09 NOTE — Telephone Encounter (Signed)
Pt daughter called, states pt right eye, after her procedure yesterday, was blood shot. She states it was better this morning, but the outside is red and swollen.States pt is able to open her eye.Please call.

## 2017-02-09 NOTE — Telephone Encounter (Signed)
Message relayed to patient daughter.

## 2017-02-09 NOTE — Telephone Encounter (Signed)
Recommend seeing opthalmologist

## 2017-02-09 NOTE — Telephone Encounter (Signed)
Please advise on message below.

## 2017-02-10 LAB — CYTOLOGY - NON PAP

## 2017-02-13 ENCOUNTER — Other Ambulatory Visit: Payer: Self-pay | Admitting: Internal Medicine

## 2017-02-13 DIAGNOSIS — R918 Other nonspecific abnormal finding of lung field: Secondary | ICD-10-CM

## 2017-02-13 NOTE — Progress Notes (Signed)
Discussed biopsy results with Mrs. Mary Burke The biopsies from pathology reported negative for malignancy however with the increased uptake with the abnormal pet scan, I have discussed further diagnostic evaluation with interventional radiology Dr. Reesa Chew who has reviewed films and states that CT-guided biopsy can be performed.  I have advised patient that the Will be to set up for CT-guided biopsy

## 2017-02-14 ENCOUNTER — Other Ambulatory Visit: Payer: Self-pay | Admitting: Specialist

## 2017-02-14 DIAGNOSIS — R918 Other nonspecific abnormal finding of lung field: Secondary | ICD-10-CM

## 2017-02-14 DIAGNOSIS — D860 Sarcoidosis of lung: Secondary | ICD-10-CM

## 2017-02-17 ENCOUNTER — Telehealth: Payer: Self-pay | Admitting: Internal Medicine

## 2017-02-22 ENCOUNTER — Ambulatory Visit
Admission: RE | Admit: 2017-02-22 | Discharge: 2017-02-22 | Disposition: A | Discharge status: Home / Self Care | Payer: Medicare Other | Source: Ambulatory Visit | Attending: Specialist | Admitting: Specialist

## 2017-02-22 ENCOUNTER — Other Ambulatory Visit: Payer: Self-pay | Admitting: Radiology

## 2017-02-22 DIAGNOSIS — J9811 Atelectasis: Secondary | ICD-10-CM

## 2017-02-22 DIAGNOSIS — R918 Other nonspecific abnormal finding of lung field: Secondary | ICD-10-CM | POA: Insufficient documentation

## 2017-02-22 DIAGNOSIS — D860 Sarcoidosis of lung: Secondary | ICD-10-CM

## 2017-02-22 LAB — POCT I-STAT CREATININE: Creatinine, Ser: 0.6 mg/dL (ref 0.44–1.00)

## 2017-02-23 MED ORDER — IOPAMIDOL (ISOVUE-300) INJECTION 61%
75.0000 mL | Freq: Once | INTRAVENOUS | Status: AC | PRN
  Administered 2017-02-22: 75 mL via INTRAVENOUS

## 2017-02-24 ENCOUNTER — Ambulatory Visit
Admission: RE | Admit: 2017-02-24 | Discharge: 2017-02-24 | Disposition: A | Discharge status: Home / Self Care | Payer: Medicare Other | Source: Ambulatory Visit | Attending: Internal Medicine | Admitting: Internal Medicine

## 2017-02-24 ENCOUNTER — Ambulatory Visit
Admission: RE | Admit: 2017-02-24 | Discharge: 2017-02-24 | Disposition: A | Discharge status: Home / Self Care | Payer: Medicare Other | Source: Ambulatory Visit | Attending: Diagnostic Radiology | Admitting: Diagnostic Radiology

## 2017-02-24 ENCOUNTER — Inpatient Hospital Stay
Admission: AD | Admit: 2017-02-24 | Discharge: 2017-02-27 | DRG: 197 | Disposition: A | Discharge status: Home / Self Care | Payer: Medicare Other | Attending: Internal Medicine | Admitting: Internal Medicine

## 2017-02-24 ENCOUNTER — Inpatient Hospital Stay: Payer: Medicare Other

## 2017-02-24 ENCOUNTER — Encounter: Payer: Self-pay | Admitting: Internal Medicine

## 2017-02-24 DIAGNOSIS — J95811 Postprocedural pneumothorax: Secondary | ICD-10-CM | POA: Diagnosis present

## 2017-02-24 DIAGNOSIS — D869 Sarcoidosis, unspecified: Principal | ICD-10-CM | POA: Diagnosis present

## 2017-02-24 DIAGNOSIS — Z7982 Long term (current) use of aspirin: Secondary | ICD-10-CM

## 2017-02-24 DIAGNOSIS — M19041 Primary osteoarthritis, right hand: Secondary | ICD-10-CM | POA: Diagnosis present

## 2017-02-24 DIAGNOSIS — K8020 Calculus of gallbladder without cholecystitis without obstruction: Secondary | ICD-10-CM | POA: Diagnosis present

## 2017-02-24 DIAGNOSIS — J942 Hemothorax: Secondary | ICD-10-CM | POA: Diagnosis present

## 2017-02-24 DIAGNOSIS — E119 Type 2 diabetes mellitus without complications: Secondary | ICD-10-CM | POA: Diagnosis present

## 2017-02-24 DIAGNOSIS — M170 Bilateral primary osteoarthritis of knee: Secondary | ICD-10-CM | POA: Diagnosis present

## 2017-02-24 DIAGNOSIS — I10 Essential (primary) hypertension: Secondary | ICD-10-CM | POA: Diagnosis present

## 2017-02-24 DIAGNOSIS — Z8701 Personal history of pneumonia (recurrent): Secondary | ICD-10-CM

## 2017-02-24 DIAGNOSIS — I700 Atherosclerosis of aorta: Secondary | ICD-10-CM | POA: Diagnosis present

## 2017-02-24 DIAGNOSIS — Z9889 Other specified postprocedural states: Secondary | ICD-10-CM

## 2017-02-24 DIAGNOSIS — J939 Pneumothorax, unspecified: Secondary | ICD-10-CM

## 2017-02-24 DIAGNOSIS — D62 Acute posthemorrhagic anemia: Secondary | ICD-10-CM | POA: Diagnosis not present

## 2017-02-24 DIAGNOSIS — G43909 Migraine, unspecified, not intractable, without status migrainosus: Secondary | ICD-10-CM | POA: Diagnosis present

## 2017-02-24 DIAGNOSIS — R918 Other nonspecific abnormal finding of lung field: Secondary | ICD-10-CM

## 2017-02-24 DIAGNOSIS — Z79899 Other long term (current) drug therapy: Secondary | ICD-10-CM

## 2017-02-24 DIAGNOSIS — J45909 Unspecified asthma, uncomplicated: Secondary | ICD-10-CM | POA: Diagnosis present

## 2017-02-24 DIAGNOSIS — E785 Hyperlipidemia, unspecified: Secondary | ICD-10-CM | POA: Diagnosis present

## 2017-02-24 DIAGNOSIS — K219 Gastro-esophageal reflux disease without esophagitis: Secondary | ICD-10-CM | POA: Diagnosis present

## 2017-02-24 DIAGNOSIS — Z881 Allergy status to other antibiotic agents status: Secondary | ICD-10-CM

## 2017-02-24 DIAGNOSIS — M1991 Primary osteoarthritis, unspecified site: Secondary | ICD-10-CM | POA: Diagnosis present

## 2017-02-24 DIAGNOSIS — M19042 Primary osteoarthritis, left hand: Secondary | ICD-10-CM | POA: Diagnosis present

## 2017-02-24 LAB — CBC
HCT: 38.7 % (ref 35.0–47.0)
Hemoglobin: 12.8 g/dL (ref 12.0–16.0)
MCH: 28.3 pg (ref 26.0–34.0)
MCHC: 33.1 g/dL (ref 32.0–36.0)
MCV: 85.6 fL (ref 80.0–100.0)
Platelets: 301 10*3/uL (ref 150–440)
RBC: 4.53 MIL/uL (ref 3.80–5.20)
RDW: 14.6 % — ABNORMAL HIGH (ref 11.5–14.5)
WBC: 7.1 10*3/uL (ref 3.6–11.0)

## 2017-02-24 LAB — APTT: aPTT: 27 seconds (ref 24–36)

## 2017-02-24 LAB — PROTIME-INR
INR: 0.96
Prothrombin Time: 12.8 seconds (ref 11.4–15.2)

## 2017-02-24 MED ORDER — FLUTICASONE PROPIONATE HFA 110 MCG/ACT IN AERO: 2.0000 | INHALATION_SPRAY | Freq: Every day | RESPIRATORY_TRACT | Status: DC | PRN

## 2017-02-24 MED ORDER — RALOXIFENE HCL 60 MG PO TABS
60.0000 mg | ORAL_TABLET | Freq: Every day | ORAL | Status: DC
  Administered 2017-02-25 – 2017-02-27 (×3): 60 mg via ORAL
  Filled 2017-02-24 (×3): qty 1

## 2017-02-24 MED ORDER — SUCRALFATE 1 G PO TABS
0.5000 g | ORAL_TABLET | Freq: Three times a day (TID) | ORAL | Status: DC
  Administered 2017-02-25 – 2017-02-27 (×5): 0.5 g via ORAL
  Filled 2017-02-24 (×7): qty 1

## 2017-02-24 MED ORDER — HYDROCODONE-ACETAMINOPHEN 5-325 MG PO TABS
1.0000 | ORAL_TABLET | ORAL | Status: DC | PRN
  Administered 2017-02-24 (×2): 1 via ORAL
  Filled 2017-02-24 (×2): qty 1

## 2017-02-24 MED ORDER — MIDAZOLAM HCL 2 MG/2ML IJ SOLN
INTRAMUSCULAR | Status: AC | PRN
  Administered 2017-02-24: 1 mg via INTRAVENOUS
  Administered 2017-02-24: 0.5 mg via INTRAVENOUS

## 2017-02-24 MED ORDER — LOSARTAN POTASSIUM 25 MG PO TABS
25.0000 mg | ORAL_TABLET | ORAL | Status: DC
  Administered 2017-02-25 – 2017-02-27 (×2): 25 mg via ORAL
  Filled 2017-02-24 (×2): qty 1

## 2017-02-24 MED ORDER — CALCIUM CARBONATE-VITAMIN D 500-200 MG-UNIT PO TABS
2.0000 | ORAL_TABLET | Freq: Two times a day (BID) | ORAL | Status: DC
  Administered 2017-02-25 – 2017-02-27 (×5): 2 via ORAL
  Filled 2017-02-24 (×5): qty 2

## 2017-02-24 MED ORDER — HYDROCODONE-ACETAMINOPHEN 5-325 MG PO TABS
ORAL_TABLET | ORAL | Status: AC
  Administered 2017-02-24: 1 via ORAL
  Filled 2017-02-24: qty 1

## 2017-02-24 MED ORDER — FENTANYL CITRATE (PF) 100 MCG/2ML IJ SOLN
INTRAMUSCULAR | Status: AC | PRN
  Administered 2017-02-24: 50 ug via INTRAVENOUS
  Administered 2017-02-24: 25 ug via INTRAVENOUS

## 2017-02-24 MED ORDER — HYDROMORPHONE HCL 1 MG/ML IJ SOLN
INTRAMUSCULAR | Status: AC
  Filled 2017-02-24: qty 1

## 2017-02-24 MED ORDER — HYDROCODONE-ACETAMINOPHEN 7.5-325 MG PO TABS
1.0000 | ORAL_TABLET | ORAL | Status: DC | PRN
  Filled 2017-02-24: qty 1

## 2017-02-24 MED ORDER — ROSUVASTATIN CALCIUM 5 MG PO TABS
5.0000 mg | ORAL_TABLET | Freq: Every day | ORAL | Status: DC
  Administered 2017-02-24 – 2017-02-26 (×3): 5 mg via ORAL
  Filled 2017-02-24 (×4): qty 1

## 2017-02-24 MED ORDER — MONTELUKAST SODIUM 10 MG PO TABS
10.0000 mg | ORAL_TABLET | Freq: Every day | ORAL | Status: DC
  Administered 2017-02-25 – 2017-02-26 (×2): 10 mg via ORAL
  Filled 2017-02-24 (×2): qty 1

## 2017-02-24 MED ORDER — MORPHINE SULFATE (PF) 4 MG/ML IV SOLN
2.0000 mg | INTRAVENOUS | Status: DC | PRN
  Administered 2017-02-25: 2 mg via INTRAVENOUS
  Filled 2017-02-24: qty 1

## 2017-02-24 MED ORDER — FENTANYL CITRATE (PF) 100 MCG/2ML IJ SOLN
INTRAMUSCULAR | Status: AC
  Filled 2017-02-24: qty 2

## 2017-02-24 MED ORDER — KETOROLAC TROMETHAMINE 60 MG/2ML IM SOLN
INTRAMUSCULAR | Status: DC
Start: 2017-02-24 — End: 2017-02-25
  Filled 2017-02-24: qty 2

## 2017-02-24 MED ORDER — ONDANSETRON HCL 4 MG/2ML IJ SOLN: 4.0000 mg | Freq: Four times a day (QID) | INTRAMUSCULAR | Status: DC | PRN

## 2017-02-24 MED ORDER — FENTANYL CITRATE (PF) 100 MCG/2ML IJ SOLN
INTRAMUSCULAR | Status: AC
  Filled 2017-02-24: qty 4

## 2017-02-24 MED ORDER — PANTOPRAZOLE SODIUM 40 MG PO TBEC
40.0000 mg | DELAYED_RELEASE_TABLET | Freq: Every day | ORAL | Status: DC
  Administered 2017-02-25 – 2017-02-27 (×3): 40 mg via ORAL
  Filled 2017-02-24 (×2): qty 1
  Filled 2017-02-24: qty 2
  Filled 2017-02-24: qty 1

## 2017-02-24 MED ORDER — SODIUM CHLORIDE 0.9% FLUSH
5.0000 mL | Freq: Three times a day (TID) | INTRAVENOUS | Status: DC
  Administered 2017-02-25 – 2017-02-27 (×5): 5 mL via INTRAVENOUS

## 2017-02-24 MED ORDER — KETOROLAC TROMETHAMINE 60 MG/2ML IM SOLN
30.0000 mg | Freq: Once | INTRAMUSCULAR | Status: DC
  Filled 2017-02-24: qty 2

## 2017-02-24 MED ORDER — HYDROCODONE-ACETAMINOPHEN 5-325 MG PO TABS
1.0000 | ORAL_TABLET | ORAL | Status: DC | PRN
  Administered 2017-02-24 – 2017-02-25 (×2): 2 via ORAL
  Administered 2017-02-25: 1 via ORAL
  Administered 2017-02-25: 2 via ORAL
  Administered 2017-02-26: 1 via ORAL
  Administered 2017-02-26: 2 via ORAL
  Administered 2017-02-27: 1 via ORAL
  Filled 2017-02-24 (×2): qty 2
  Filled 2017-02-24: qty 1
  Filled 2017-02-24 (×4): qty 2

## 2017-02-24 MED ORDER — KETOROLAC TROMETHAMINE 30 MG/ML IJ SOLN
30.0000 mg | Freq: Once | INTRAMUSCULAR | Status: AC
  Administered 2017-02-24: 30 mg via INTRAVENOUS
  Filled 2017-02-24: qty 1

## 2017-02-24 MED ORDER — ACETAMINOPHEN 325 MG PO TABS: 650.0000 mg | ORAL_TABLET | Freq: Four times a day (QID) | ORAL | Status: DC | PRN

## 2017-02-24 MED ORDER — ACETAMINOPHEN 650 MG RE SUPP: 650.0000 mg | Freq: Four times a day (QID) | RECTAL | Status: DC | PRN

## 2017-02-24 MED ORDER — HYDROCODONE-ACETAMINOPHEN 5-325 MG PO TABS
ORAL_TABLET | ORAL | Status: AC
  Filled 2017-02-24: qty 1

## 2017-02-24 MED ORDER — SODIUM CHLORIDE 0.9 % IV SOLN: 4.0000 mg | Freq: Four times a day (QID) | INTRAVENOUS | Status: DC | PRN

## 2017-02-24 MED ORDER — B COMPLEX-C PO TABS
1.0000 | ORAL_TABLET | Freq: Every day | ORAL | Status: DC
  Administered 2017-02-25 – 2017-02-27 (×3): 1 via ORAL
  Filled 2017-02-24 (×3): qty 1

## 2017-02-24 MED ORDER — SODIUM CHLORIDE 0.9 % IV SOLN
INTRAVENOUS | Status: DC
  Administered 2017-02-24: 11:00:00 via INTRAVENOUS

## 2017-02-24 MED ORDER — MIDAZOLAM HCL 2 MG/2ML IJ SOLN
INTRAMUSCULAR | Status: AC
  Filled 2017-02-24: qty 4

## 2017-02-24 MED ORDER — LORATADINE 10 MG PO TABS
10.0000 mg | ORAL_TABLET | Freq: Every morning | ORAL | Status: DC
  Administered 2017-02-25 – 2017-02-27 (×3): 10 mg via ORAL
  Filled 2017-02-24 (×3): qty 1

## 2017-02-24 MED ORDER — FENTANYL CITRATE (PF) 100 MCG/2ML IJ SOLN
INTRAMUSCULAR | Status: AC | PRN
  Administered 2017-02-24: 25 ug via INTRAVENOUS
  Administered 2017-02-24: 50 ug via INTRAVENOUS

## 2017-02-24 MED ORDER — HYDROMORPHONE HCL 1 MG/ML IJ SOLN
1.0000 mg | Freq: Once | INTRAMUSCULAR | Status: AC
  Administered 2017-02-24: 1 mg via INTRAVENOUS
  Filled 2017-02-24: qty 1

## 2017-02-24 MED ORDER — FAMOTIDINE 20 MG PO TABS
20.0000 mg | ORAL_TABLET | Freq: Every day | ORAL | Status: DC
  Administered 2017-02-24 – 2017-02-26 (×3): 20 mg via ORAL
  Filled 2017-02-24 (×3): qty 1

## 2017-02-24 MED ORDER — MIDAZOLAM HCL 2 MG/2ML IJ SOLN
INTRAMUSCULAR | Status: AC
  Filled 2017-02-24: qty 2

## 2017-02-24 MED ORDER — MIDAZOLAM HCL 2 MG/2ML IJ SOLN
INTRAMUSCULAR | Status: AC | PRN
  Administered 2017-02-24: 0.5 mg via INTRAVENOUS
  Administered 2017-02-24: 1 mg via INTRAVENOUS

## 2017-02-24 MED ORDER — FLUTICASONE PROPIONATE 50 MCG/ACT NA SUSP
2.0000 | Freq: Every day | NASAL | Status: DC | PRN
  Filled 2017-02-24: qty 16

## 2017-02-24 MED ORDER — ALBUTEROL SULFATE (2.5 MG/3ML) 0.083% IN NEBU: 3.0000 mL | INHALATION_SOLUTION | Freq: Four times a day (QID) | RESPIRATORY_TRACT | Status: DC | PRN

## 2017-02-24 MED ORDER — BUDESONIDE 0.25 MG/2ML IN SUSP: 0.2500 mg | Freq: Two times a day (BID) | RESPIRATORY_TRACT | Status: DC | PRN

## 2017-02-24 NOTE — Progress Notes (Signed)
Dr Jeronimo Norma at bedside discussing xray results. Placed patient on 2L  per MD request.

## 2017-02-24 NOTE — OR Nursing (Signed)
Pt returned to ct for chest tube insertion at 0700 with Dr Barbie Banner. Pt sedated with versed and fetn, oxygen sats into upper 80's with position and sedation but rebounded immediately after lung reinflated post chest tube insertion. 400 ml blood noted in chest tube suction canister. Report given to [employer]. Care turned over to her at 2010.

## 2017-02-24 NOTE — Progress Notes (Signed)
  Patient s/p RUL biopsy today.  She experienced sharp pain at the right scapula.  Chest X-ray reveals ~ 20 % pneumothorax.  She currently stable. No chest tube needed at this time.  Dr. Anselm Pancoast has called pulmonology and they are aware.  I have called Dr. Leslye Peer for admission to the hospital overnight for observation.  Will repeat Chest X-ray again later this evening.  If pneumothorax enlarging, patient will require chest tube placement.  Keep NPO for now.  If pneumothorax is stable or smaller, Ok to start a diet.  Also repeat chest X-ray in am.  [facility] Draper Gallon PA-C 02/24/2017 3:54 PM
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
## 2017-02-24 NOTE — Discharge Instructions (Signed)
Needle Biopsy of the Lung, Care After °This sheet gives you information about how to care for yourself after your procedure. Your health care provider may also give you more specific instructions. If you have problems or questions, contact your health care provider. °What can I expect after the procedure? °After the procedure, it is common to have: °· Soreness, pain, and tenderness where a tissue sample was taken (biopsy site). °· A cough. °· A sore throat. ° °Follow these instructions at home: °Biopsy site care °· Follow instructions from your health care provider about when to remove the bandage that was placed on the biopsy site. °· Keep the bandage dry until it has been removed. °· Check your biopsy site every day for signs of infection. Check for: °? More redness, swelling, or pain. °? More fluid or blood. °? Warmth to the touch. °? Pus or a bad smell. °General instructions °· Rest as directed by your health care provider. Ask your health care provider what activities are safe for you. °· Do not take baths, swim, or use a hot tub until your health care provider approves. °· Take over-the-counter and prescription medicines only as told by your health care provider. °· If you have airplane travel scheduled, talk with your health care provider about when it is safe for you to travel by airplane. °· It is up to you to get the results of your procedure. Ask your health care provider, or the department that is doing the procedure, when your results will be ready. °· Keep all follow-up visits as told by your health care provider. This is important. °Contact a health care provider if: °· You have more redness, swelling, or pain around your biopsy site. °· You have more fluid or blood coming from your biopsy site. °· Your biopsy site feels warm to the touch. °· You have pus or a bad smell coming from your biopsy site. °· You have a fever. °· You have pain that does not get better with medicine. °Get help right away  if: °· You have problems breathing. °· You have chest pain. °· You cough up blood. °· You faint. °· You have a fast heart rate. °Summary °· After a needle biopsy of the lung, it is common to have a cough, a sore throat, or soreness, pain, and tenderness where a tissue sample was taken (biopsy site). °· You should check your biopsy area every day for signs of infection, including pus or a bad smell, warmth, more fluid or blood, or more redness, swelling, or pain. °· You should not take baths, swim, or use a hot tub until your health care provider approves. °· It is up to you to get the results of your procedure. Ask your health care provider, or the department that is doing the procedure, when your results will be ready. °This information is not intended to replace advice given to you by your health care provider. Make sure you discuss any questions you have with your health care provider. °Document Released: 05/01/2007 Document Revised: 05/25/2016 Document Reviewed: 05/25/2016 °Elsevier Interactive Patient Education © 2017 [employer] ° ° °

## 2017-02-24 NOTE — Procedures (Addendum)
R 12 Fr thoracostomy EBL 50 cc Comp 0  CXR tomorrow morning We are following the patient for chest tube management. Thank you

## 2017-02-24 NOTE — Procedures (Signed)
CT guided biopsy of right upper lobe lesion.  4 cores and 1 FNA.   Minimal material obtained from cores.  1 core and 1 aspirate for culture.  Minimal blood loss.  No immediate complication.

## 2017-02-24 NOTE — Progress Notes (Signed)
Took over care for patient at 1430. Patient is alert and oriented. Reporting no pain at this time. Follow up chest xray scheduled for 1500.

## 2017-02-24 NOTE — OR Nursing (Signed)
Post procedure: within 5 minutes after arrival to specials recover reported starting having pain right upper back. Progressively worsening. Gareth Eagle PA and Dr Anselm Pancoast in to see pt. Initially Norco given po but since pain progressed given IV toradol and then dilaudid. Oxygen saturations 100% on room air, ETCO2 18, pt denies shortness of breath but pain with inspiration. Reporting numbness and tingling of fingers. 13:30 chest CXR done earlier. Small pneumothorax per Dr Anselm Pancoast.

## 2017-02-25 ENCOUNTER — Inpatient Hospital Stay: Payer: Medicare Other

## 2017-02-25 MED ORDER — BISACODYL 5 MG PO TBEC: 5.0000 mg | DELAYED_RELEASE_TABLET | Freq: Every day | ORAL | Status: DC | PRN

## 2017-02-25 MED ORDER — DOCUSATE SODIUM 100 MG PO CAPS
100.0000 mg | ORAL_CAPSULE | Freq: Two times a day (BID) | ORAL | Status: DC
  Administered 2017-02-25 – 2017-02-27 (×5): 100 mg via ORAL
  Filled 2017-02-25 (×5): qty 1

## 2017-02-25 NOTE — Progress Notes (Signed)
Pt ambulated around the nursing station 5 times without oxygen, a total of 800 ft. Pt.'s O2 dropped to 90 % but went back to 93 % immediately. Pt did not express any SOB while ambulating. RN will continue to encourage ambulation.   Mary Burke [employer]

## 2017-02-25 NOTE — Progress Notes (Signed)
[place] [facility] [place] NAME: Mary Burke    MR#:  [id number]  DATE OF BIRTH:  22-May-1946  SUBJECTIVE:  CHIEF COMPLAINT:  No chief complaint on file.  Better chest pain, s/p chest tube placement. REVIEW OF SYSTEMS:  Review of Systems  Constitutional: Negative for chills, fever and malaise/fatigue.  HENT: Negative for sore throat.   Eyes: Negative for blurred vision and double vision.  Respiratory: Negative for cough, hemoptysis, shortness of breath, wheezing and stridor.   Cardiovascular: Positive for chest pain. Negative for palpitations, orthopnea and leg swelling.  Gastrointestinal: Positive for constipation. Negative for abdominal pain, blood in stool, diarrhea, melena, nausea and vomiting.  Genitourinary: Negative for dysuria, flank pain and hematuria.  Musculoskeletal: Negative for back pain and joint pain.  Neurological: Negative for dizziness, sensory change, focal weakness, seizures, loss of consciousness, weakness and headaches.  Endo/Heme/Allergies: Negative for polydipsia.  Psychiatric/Behavioral: Negative for depression. The patient is not nervous/anxious.     DRUG ALLERGIES:   Allergies  Allergen Reactions  . Cefdinir Hives and Nausea Only    CAN TAKE AMOX     VITALS:  Height 5' 1.5" (1.562 m), weight 170 lb 14.4 oz (77.5 kg). PHYSICAL EXAMINATION:  Physical Exam  Constitutional: She is oriented to person, place, and time and well-developed, well-nourished, and in no distress.  HENT:  Head: Normocephalic.  Mouth/Throat: Oropharynx is clear and moist.  Eyes: Pupils are equal, round, and reactive to light. Conjunctivae and EOM are normal. No scleral icterus.  Neck: Normal range of motion. Neck supple. No JVD present. No tracheal deviation present.  Cardiovascular: Normal rate, regular rhythm and normal heart sounds.  Exam reveals no gallop.   No murmur heard. Pulmonary/Chest: Effort normal and breath sounds  normal. No respiratory distress. She has no wheezes. She has no rales.  Chest tube in situ with a little bloody drainage without air leak  Abdominal: Soft. Bowel sounds are normal. She exhibits no distension. There is no tenderness. There is no rebound.  Musculoskeletal: Normal range of motion. She exhibits no edema or tenderness.  Neurological: She is alert and oriented to person, place, and time. No cranial nerve deficit.  Skin: No rash noted. No erythema.  Psychiatric: Affect normal.   LABORATORY PANEL:  Female CBC  Recent Labs Lab 02/24/17 1056  WBC 7.1  HGB 12.8  HCT 38.7  PLT 301   ------------------------------------------------------------------------------------------------------------------ Chemistries   Recent Labs Lab 02/22/17 1447  CREATININE 0.60   RADIOLOGY:  Dg Chest 1 View  Result Date: 02/24/2017 CLINICAL DATA:  Right lung biopsy. EXAM: CHEST 1 VIEW COMPARISON:  Today at 1:01 p.m. FINDINGS: Right pneumothorax remains small volume but has increased from prior. Gas collection measures 15% or smaller. The right upper lobe nodule is subtle. No evidence of alveolar hemorrhage. Mild bilateral atelectasis. Normal heart size and mediastinal contours. Dr. Anselm Pancoast is already aware of the findings. IMPRESSION: 1. Increased but still small right pneumothorax. 2. The right upper lobe nodule is subtle. No evidence of alveolar hemorrhage. Electronically Signed   By: Monte Fantasia M.D.   On: 02/24/2017 15:13   Dg Chest 2 View  Result Date: 02/25/2017 CLINICAL DATA:  Follow-up post biopsy pneumothorax EXAM: CHEST  2 VIEW COMPARISON:  02/24/2017 FINDINGS: There is an increasing right-sided pneumothorax identified. Known right upper lobe mass lesion is again seen. Some scarring is noted in the left lung. Cardiac shadow is stable. No acute bony abnormality is noted. IMPRESSION: Increase in  right-sided pneumothorax. These changes are already known to the referring service and  percutaneous intervention has been performed. Electronically Signed   By: Inez Catalina M.D.   On: 02/25/2017 07:32   Dg Chest Port 1 View  Result Date: 02/25/2017 CLINICAL DATA:  Pneumothorax after biopsy. EXAM: PORTABLE CHEST 1 VIEW COMPARISON:  None. FINDINGS: The heart size is exaggerated by low lung volumes. A right-sided chest tube is in place. A right pleural effusion is noted. No significant pleural air remains. Linear atelectasis is present bilaterally. IMPRESSION: 1. Right-sided chest tube without significant residual pleural air. 2. Moderate right-sided pleural effusion has a place the pneumothorax. 3. Bilateral linear atelectasis. Electronically Signed   By: San Morelle M.D.   On: 02/25/2017 07:13   ASSESSMENT AND PLAN:   1. Pneumothorax after lung biopsy for lung nodule. Better pneumothorax after chest tube placement. Repeat chest x-ray tomorrow morning. Pain control and Ambulate with assistance.  2. Gastroesophageal reflux disease on PPI, H2 blocker and Carafate. 3. History of sarcoidosis. Respiratory status stable. Continue pro-air and Flovent 4. Hyperlipidemia unspecified on Crestor 5. Prediabetes as per patient diet control.  All the records are reviewed and case discussed with Care Management/Social Worker. Management plans discussed with the patient, Her daughter and they are in agreement.  CODE STATUS: Full Code  TOTAL TIME TAKING CARE OF THIS PATIENT: 32 minutes.   More than 50% of the time was spent in counseling/coordination of care: YES  POSSIBLE D/C IN 2-3 DAYS, DEPENDING ON CLINICAL CONDITION.   Demetrios Loll M.D on 02/25/2017 at 3:02 PM  Between 7am to 6pm - Pager - [phone]  After 6pm go to [email or web address] - Proofreader  [facility]  Office  [phone]  CC: Primary care physician; Olin Hauser, DO  Note: This dictation was prepared with Dragon dictation along with smaller phrase technology. Any  transcriptional errors that result from this process are unintentional.
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
## 2017-02-26 ENCOUNTER — Inpatient Hospital Stay: Payer: Medicare Other

## 2017-02-26 LAB — CBC
HCT: 30.2 % — ABNORMAL LOW (ref 35.0–47.0)
Hemoglobin: 10 g/dL — ABNORMAL LOW (ref 12.0–16.0)
MCH: 28.9 pg (ref 26.0–34.0)
MCHC: 33.2 g/dL (ref 32.0–36.0)
MCV: 87 fL (ref 80.0–100.0)
Platelets: 232 10*3/uL (ref 150–440)
RBC: 3.47 MIL/uL — ABNORMAL LOW (ref 3.80–5.20)
RDW: 14.9 % — ABNORMAL HIGH (ref 11.5–14.5)
WBC: 5.7 10*3/uL (ref 3.6–11.0)

## 2017-02-26 LAB — ACID FAST SMEAR (AFB, MYCOBACTERIA): Acid Fast Smear: NEGATIVE

## 2017-02-26 NOTE — Progress Notes (Signed)
The AM CXR shows a small residual right apical PTX. The current management will be continued with eater suction at 20 cm water and ambulation with assistance. Her Hb has dropped form 12.8 to 10.0, but chest tube output has only been 50 cc since last night. This is likely due to some blood loss and dilution. If PTX persists tomorrow, consider surgical consultation.

## 2017-02-26 NOTE — Evaluation (Signed)
Physical Therapy Screen Patient Details Name: Mary Burke MRN: [id number] DOB: 31-May-1946 Today's Date: 02/26/2017   History of Present Illness     Clinical Impression  71 yo Female had lung biopsy and shortly after had pneumothorax. She was admitted and chest tube placed. While in hospital she has been walking around nursing station mod I with help needed to hold chest tube drain; She reports that she is moving a little slower but overall states that she could be independent in self care. All needs addressed during PT screen. No skilled needs identified; will discontinue order;                        Precautions / Restrictions Precautions Precautions: None          Pertinent Vitals/Pain Pain Assessment: 0-10 Pain Score: 2  Pain Location: right chest/back pain;  Pain Descriptors / Indicators: Aching;Sore Pain Intervention(s): Limited activity within patient's tolerance    Home Living Family/patient expects to be discharged to:: Private residence Living Arrangements: Spouse/significant other Available Help at Discharge: Family (husband in hospice, sister in law can help some; ) Type of Home: House Home Access: Ramped entrance     Home Layout: One level Home Equipment: Shower seat      Prior Function Level of Independence: Independent         Comments: 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

## 2017-02-26 NOTE — Progress Notes (Signed)
[place] [facility] [place] NAME: Inola Lisle    MR#:  [id number]  DATE OF BIRTH:  [birth year]  SUBJECTIVE:  CHIEF COMPLAINT:  No chief complaint on file.  No SOB or chest pain. On chest tube drainage, no air leak. REVIEW OF SYSTEMS:  Review of Systems  Constitutional: Negative for chills, fever and malaise/fatigue.  HENT: Negative for sore throat.   Eyes: Negative for blurred vision and double vision.  Respiratory: Negative for cough, hemoptysis, shortness of breath, wheezing and stridor.   Cardiovascular: Negative for chest pain, palpitations, orthopnea and leg swelling.  Gastrointestinal: Negative for abdominal pain, blood in stool, constipation, diarrhea, melena, nausea and vomiting.  Genitourinary: Negative for dysuria, flank pain and hematuria.  Musculoskeletal: Negative for back pain and joint pain.  Neurological: Negative for dizziness, sensory change, focal weakness, seizures, loss of consciousness, weakness and headaches.  Endo/Heme/Allergies: Negative for polydipsia.  Psychiatric/Behavioral: Negative for depression. The patient is not nervous/anxious.     DRUG ALLERGIES:   Allergies  Allergen Reactions  . Cefdinir Hives and Nausea Only    CAN TAKE AMOX     VITALS:  Height 5\' 1"  (1.549 m), weight 163 lb 2.3 oz (74 kg). PHYSICAL EXAMINATION:  Physical Exam  Constitutional: She is oriented to person, place, and time and well-developed, well-nourished, and in no distress.  HENT:  Head: Normocephalic.  Mouth/Throat: Oropharynx is clear and moist.  Eyes: Pupils are equal, round, and reactive to light. Conjunctivae and EOM are normal. No scleral icterus.  Neck: Normal range of motion. Neck supple. No JVD present. No tracheal deviation present.  Cardiovascular: Normal rate, regular rhythm and normal heart sounds.  Exam reveals no gallop.   No murmur heard. Pulmonary/Chest: Effort normal and breath sounds normal. No  respiratory distress. She has no wheezes. She has no rales.  Chest tube in situ with a little bloody drainage without air leak  Abdominal: Soft. Bowel sounds are normal. She exhibits no distension. There is no tenderness. There is no rebound.  Musculoskeletal: Normal range of motion. She exhibits no edema or tenderness.  Neurological: She is alert and oriented to person, place, and time. No cranial nerve deficit.  Skin: No rash noted. No erythema.  Psychiatric: Affect normal.   LABORATORY PANEL:  Female CBC  Recent Labs Lab 02/26/17 0347  WBC 5.7  HGB 10.0*  HCT 30.2*  PLT 232   ------------------------------------------------------------------------------------------------------------------ Chemistries   Recent Labs Lab 02/22/17 1447  CREATININE 0.60   RADIOLOGY:  Dg Chest 1 View  Result Date: 02/26/2017 CLINICAL DATA:  Pneumothorax post biopsy EXAM: CHEST 1 VIEW COMPARISON:  Portable exam 0553 hours compared to 02/25/2017 FINDINGS: Pigtail thoracostomy tube in upper RIGHT hemithorax unchanged. Normal heart size, mediastinal contours, and pulmonary vascularity. Persistent RIGHT apical opacity question effusion versus pleural thickening. No definite pneumothorax or pulmonary infiltrate. Scattered plates of atelectasis in the mid to lower lungs bilaterally. Bones demineralized. IMPRESSION: RIGHT thoracostomy tube without pneumothorax. BILATERAL plates of subsegmental atelectasis. Electronically Signed   By: Lavonia Dana M.D.   On: 02/26/2017 07:29   ASSESSMENT AND PLAN:   1. Pneumothorax after lung biopsy for lung nodule.  S/p chest tube placement. small pneumothorax per Dr. Liana Gerold.Repeat chest x-ray tomorrow morning. Pain control and Ambulate with assistance.  2. Gastroesophageal reflux disease on PPI, H2 blocker and Carafate. 3. History of sarcoidosis. Respiratory status stable. Continue pro-air and Flovent 4. Hyperlipidemia unspecified on Crestor 5. Prediabetes as per patient  diet control.  *  Anemia, possible due to acute blood loss from chest tube drainage and IV fluid dilution. Follow-up hemoglobin.  Discussed with Dr. Barbie Banner. All the records are reviewed and case discussed with Care Management/Social Worker. Management plans discussed with the patient, Her daughter and they are in agreement.  CODE STATUS: Full Code  TOTAL TIME TAKING CARE OF THIS PATIENT: 32 minutes.   More than 50% of the time was spent in counseling/coordination of care: YES  POSSIBLE D/C IN 2 DAYS, DEPENDING ON CLINICAL CONDITION.   Demetrios Loll M.D on 02/26/2017 at 12:47 PM  Between 7am to 6pm - Pager - [phone]  After 6pm go to [email or web address] - Proofreader  [facility]  Office  [phone]  CC: Primary care physician; Olin Hauser, DO  Note: This dictation was prepared with Dragon dictation along with smaller phrase technology. Any transcriptional errors that result from this process are unintentional.

## 2017-02-27 ENCOUNTER — Inpatient Hospital Stay: Payer: Medicare Other

## 2017-02-27 ENCOUNTER — Other Ambulatory Visit: Payer: Self-pay

## 2017-02-27 DIAGNOSIS — J95811 Postprocedural pneumothorax: Secondary | ICD-10-CM

## 2017-02-27 DIAGNOSIS — J942 Hemothorax: Secondary | ICD-10-CM

## 2017-02-27 LAB — GLUCOSE, CAPILLARY
Glucose-Capillary: 98 mg/dL (ref 65–99)
Glucose-Capillary: 176 mg/dL — ABNORMAL HIGH (ref 65–99)

## 2017-02-27 LAB — HEMOGLOBIN: Hemoglobin: 10.4 g/dL — ABNORMAL LOW (ref 12.0–16.0)
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
## 2017-02-27 NOTE — Progress Notes (Signed)
Referring Physician(s): Henn,Adam  Patient Status:  [name] - In-pt  Chief Complaint:  Right hemopneumothorax  Subjective:  Sitting up and eating this morning.  Feeling much improved.  Able to ambulate without SOB.  Chest pain better but still present posteriorly, like a muscle cramp.  Denies fever, chills, current SOB.   Allergies: Cefdinir  Medications: Prior to Admission medications   Medication Sig Start Date End Date Taking? Authorizing Provider  acetaminophen (TYLENOL) 500 MG tablet Take 1,000 mg by mouth 3 (three) times daily as needed (for pain.).    [provider]  albuterol (PROVENTIL HFA;VENTOLIN HFA) 108 (90 Base) MCG/ACT inhaler Inhale 2 puffs into the lungs every 6 (six) hours as needed for wheezing or shortness of breath.    [provider]  aspirin EC 81 MG tablet Take 81 mg by mouth daily.    [provider]  B Complex-C (B-COMPLEX WITH VITAMIN C) tablet Take 1 tablet by mouth daily.    [provider]  Calcium Carbonate-Vit D-Min (CALCIUM 1200) 1200-1000 MG-UNIT CHEW Chew 1 tablet by mouth 2 (two) times daily.    [provider]  fluticasone (FLONASE) 50 MCG/ACT nasal spray Place 2 sprays into both nostrils daily as needed for allergies.  05/27/15   [provider]  fluticasone (FLOVENT HFA) 110 MCG/ACT inhaler Inhale 2 puffs into the lungs daily as needed (for respiratory issues.).  10/27/14   [provider]  Glucosamine-Chondroitin (COSAMIN DS PO) Take 1 tablet by mouth 2 (two) times daily.    [provider]  loratadine (CLARITIN) 10 MG tablet Take 10 mg by mouth every morning.     [provider]  losartan (COZAAR) 25 MG tablet TAKE 1 TABLET(25 MG) BY MOUTH DAILY Patient taking differently: Take 25 mg by mouth every morning.  01/19/17   Karamalegos, Alexander J, DO  montelukast (SINGULAIR) 10 MG tablet Take 10 mg by mouth daily with supper.    [provider]  omeprazole  (PRILOSEC) 40 MG capsule Take 1 capsule (40 mg total) by mouth daily. Take 1 capsule  daily. Patient taking differently: Take 40 mg by mouth every morning. Take 1 capsule  daily. 10/31/16   Karamalegos, Devonne Doughty, DO  Probiotic Product (PROBIOTIC PO) Take 1 capsule by mouth daily.    [provider]  raloxifene (EVISTA) 60 MG tablet Take 1 tablet (60 mg total) by mouth daily. 11/21/16   Karamalegos, Devonne Doughty, DO  ranitidine (ZANTAC) 150 MG tablet TAKE 1 TABLET BY MOUTH AT BEDTIME 08/22/16   Arlis Porta., MD  rosuvastatin (CRESTOR) 5 MG tablet Take 1 tablet (5 mg total) by mouth at bedtime. 11/17/16   Karamalegos, Devonne Doughty, DO  sucralfate (CARAFATE) 1 g tablet Take 0.5 g by mouth 3 (three) times daily before meals.  10/12/15 01/25/18  [provider]  vitamin E 400 UNIT capsule Take 400 Units by mouth 2 (two) times daily.    [provider]     Vital Signs: BP (!) 119/50   Pulse (!) 117   Temp 98.5 F (36.9 C) (Oral)   Resp 18   Ht 5\' 1"  (1.549 m)   Wt 163 lb 2.3 oz (74 kg)   SpO2 97%   BMI 30.83 kg/m   Physical Exam  Constitutional: She is oriented to person, place, and time. She appears well-developed and well-nourished. No distress.  HENT:  Head: Normocephalic and atraumatic.  Eyes: No scleral icterus.  Cardiovascular: Normal rate and regular  rhythm.   Pulmonary/Chest: Effort normal.  Neurological: She is alert and oriented to person, place, and time.  Skin: Skin is warm and dry.  Psychiatric: She has a normal mood and affect. Her behavior is normal.  Nursing note and vitals reviewed.   Imaging: Dg Chest 1 View  Result Date: 02/26/2017 CLINICAL DATA:  Pneumothorax post biopsy EXAM: CHEST 1 VIEW COMPARISON:  Portable exam 0553 hours compared to 02/25/2017 FINDINGS: Pigtail thoracostomy tube in upper RIGHT hemithorax unchanged. Normal heart size, mediastinal contours, and pulmonary vascularity. Persistent RIGHT apical opacity question effusion  versus pleural thickening. No definite pneumothorax or pulmonary infiltrate. Scattered plates of atelectasis in the mid to lower lungs bilaterally. Bones demineralized. IMPRESSION: RIGHT thoracostomy tube without pneumothorax. BILATERAL plates of subsegmental atelectasis. Electronically Signed   By: Lavonia Dana M.D.   On: 02/26/2017 07:29   Dg Chest 1 View  Result Date: 02/24/2017 CLINICAL DATA:  Right lung biopsy. EXAM: CHEST 1 VIEW COMPARISON:  Today at 1:01 p.m. FINDINGS: Right pneumothorax remains small volume but has increased from prior. Gas collection measures 15% or smaller. The right upper lobe nodule is subtle. No evidence of alveolar hemorrhage. Mild bilateral atelectasis. Normal heart size and mediastinal contours. Dr. Anselm Pancoast is already aware of the findings. IMPRESSION: 1. Increased but still small right pneumothorax. 2. The right upper lobe nodule is subtle. No evidence of alveolar hemorrhage. Electronically Signed   By: Monte Fantasia M.D.   On: 02/24/2017 15:13   Dg Chest 2 View  Result Date: 02/25/2017 CLINICAL DATA:  Follow-up post biopsy pneumothorax EXAM: CHEST  2 VIEW COMPARISON:  02/24/2017 FINDINGS: There is an increasing right-sided pneumothorax identified. Known right upper lobe mass lesion is again seen. Some scarring is noted in the left lung. Cardiac shadow is stable. No acute bony abnormality is noted. IMPRESSION: Increase in right-sided pneumothorax. These changes are already known to the referring service and percutaneous intervention has been performed. Electronically Signed   By: Inez Catalina M.D.   On: 02/25/2017 07:32   Ct Chest W Contrast  Result Date: 02/23/2017 CLINICAL DATA:  Sarcoidosis.  Post therapy EXAM: CT CHEST WITH CONTRAST TECHNIQUE: Multidetector CT imaging of the chest was performed during intravenous contrast administration. CONTRAST:  30mL ISOVUE-300 IOPAMIDOL (ISOVUE-300) INJECTION 61% COMPARISON:  CT 11/24/2016 FINDINGS: Cardiovascular: No significant  vascular findings. Normal heart size. No pericardial effusion. Mediastinum/Nodes: No axillary or supraclavicular adenopathy. No mediastinal or hilar adenopathy. No pericardial fluid. Moderate size hiatal hernia. Lungs/Pleura: RIGHT upper lobe oblong mass measures 30 mm x 19 mm compared with 30 mm x 24 mm remeasured. This lesion was hypermetabolic on comparison PET-CT scan. Small linear nodular thickening in the lateral RIGHT upper lobe (image 40, series 3) is unchanged. Band of atelectasis in the more inferior RIGHT middle lobe with associated bronchiectasis (image 70, series 3) is not changed. No new nodularity is present. Band of linear atelectasis in the lingula unchanged Upper Abdomen: Limited view of the liver, kidneys, pancreas are unremarkable. Normal adrenal glands. Musculoskeletal: No aggressive osseous lesion. IMPRESSION: 1. No significant change in RIGHT upper lobe mass which was hypermetabolic on comparison PET-CT scan. 2. Bilateral bands of atelectasis unchanged. 3. No evidence of new pulmonary nodularity. 4. No mediastinal lymphadenopathy. Electronically Signed   By: Suzy Bouchard M.D.   On: 02/23/2017 10:59   Ct Biopsy  Result Date: 02/24/2017 INDICATION: 71 year old with an indeterminate right upper lobe lesion. Prior bronchoscopy biopsies were inconclusive. EXAM: CT-GUIDED BIOPSY OF RIGHT UPPER LOBE LESION MEDICATIONS: None.  ANESTHESIA/SEDATION: Moderate (conscious) sedation was employed during this procedure. A total of Versed 1.5 mg and Fentanyl 75 mcg was administered intravenously. Moderate Sedation Time: 32 minutes. The patient's level of consciousness and vital signs were monitored continuously by radiology nursing throughout the procedure under my direct supervision. FLUOROSCOPY TIME:  None COMPLICATIONS: None immediate. PROCEDURE: Informed written consent was obtained from the patient after a thorough discussion of the procedural risks, benefits and alternatives. All questions were  addressed. Maximal Sterile Barrier Technique was utilized including caps, mask, sterile gowns, sterile gloves, sterile drape, hand hygiene and skin antiseptic. A timeout was performed prior to the initiation of the procedure. Patient was placed supine on the CT scanner. Images through the upper chest were obtained. The right axillary region was prepped and draped in sterile fashion. Skin was anesthetized with 1% lidocaine. A 17 gauge coaxial needle was directed into the right upper lobe lesion with CT guidance. Needle position confirmed within the lesion. A total of 4 core 18 gauge biopsies were obtained. A single fine-needle aspiration was also obtained with a [place] needle. Needle removed without complication. Bandage placed over the puncture site. Patient tolerated the procedure well. When the patient was in recovery, she developed severe back pain along the right scapula that required IV pain medicine. The etiology for the back pain is uncertain but thought to be musculoskeletal in etiology. FINDINGS: Again noted is a low-density lesion in the right upper lobe. Needle placement confirmed within the lesion. Scant material was obtained from the 4 core biopsies. The material looked necrotic or purulent. One core and one fine-needle aspiration was sent for culture. The other samples were placed in formalin. IMPRESSION: CT-guided biopsy of the right upper lobe lesion. Specimens sent for pathology and microbiology. Electronically Signed   By: Markus Daft M.D.   On: 02/24/2017 14:18   Dg Chest Port 1 View  Result Date: 02/27/2017 CLINICAL DATA:  Pneumothorax. EXAM: PORTABLE CHEST 1 VIEW COMPARISON:  One-view chest x-ray 02/26/2017 FINDINGS: A right-sided chest tube remains in place. Right pleural effusion is unchanged. There is no significant pleural air. Areas of linear atelectasis are similar the prior exam. IMPRESSION: 1. Stable position of right-sided chest tube. 2. Right pleural effusion unchanged  without significant pneumothorax. 3. Stable areas of linear atelectasis. Electronically Signed   By: San Morelle M.D.   On: 02/27/2017 07:10   Dg Chest Port 1 View  Result Date: 02/25/2017 CLINICAL DATA:  Pneumothorax after biopsy. EXAM: PORTABLE CHEST 1 VIEW COMPARISON:  None. FINDINGS: The heart size is exaggerated by low lung volumes. A right-sided chest tube is in place. A right pleural effusion is noted. No significant pleural air remains. Linear atelectasis is present bilaterally. IMPRESSION: 1. Right-sided chest tube without significant residual pleural air. 2. Moderate right-sided pleural effusion has a place the pneumothorax. 3. Bilateral linear atelectasis. Electronically Signed   By: San Morelle M.D.   On: 02/25/2017 07:13   Dg Chest Port 1 View  Result Date: 02/24/2017 CLINICAL DATA:  Post lung biopsy EXAM: PORTABLE CHEST 1 VIEW COMPARISON:  02/22/2017 CT chest FINDINGS: Normal heart size, mediastinal contours, and pulmonary vascularity. Atherosclerotic calcification aorta. Subsegmental atelectasis versus scarring in the mid lungs bilaterally. Small RIGHT apex pneumothorax. Remaining lungs clear. RIGHT apical nodule which biopsied under CT guidance is not adequately visualized on this exam. No pleural effusion or acute osseous findings. IMPRESSION: Small RIGHT apex pneumothorax post lung biopsy ; a follow-up study has already been performed, please refer to  that report. Electronically Signed   By: Lavonia Dana M.D.   On: 02/24/2017 16:09    Labs:  CBC:  Recent Labs  11/15/16 0807 02/24/17 1056 02/26/17 0347 02/27/17 0432  WBC 5.0 7.1 5.7  --   HGB 13.4 12.8 10.0* 10.4*  HCT 42.2 38.7 30.2*  --   PLT 322 301 232  --     COAGS:  Recent Labs  02/24/17 1056  INR 0.96  APTT 27    BMP:  Recent Labs  11/15/16 0807 02/22/17 1447  NA 142  --   K 3.9  --   CL 106  --   CO2 23  --   GLUCOSE 84  --   BUN 11  --   CALCIUM 9.0  --   CREATININE 0.67 0.60   GFRNONAA >89  --   GFRAA >89  --     LIVER FUNCTION TESTS:  Recent Labs  11/15/16 0807  BILITOT 0.3  AST 20  ALT 14  ALKPHOS 47  PROT 6.6  ALBUMIN 3.9    Assessment and Plan:  Chest tube in good position, no air leak.  There is about 500 mL bloody fluid in the chamber. Pt doing better, sitting up and eating.  Able to ambulate.  PTX appears to be resolved, however there is a moderate effusion, likely hemothorax.  Hgb stable today, I don't think she is continuing to bleed.   1.) I clamped the chest tube at 0900.  Will repeat CXR at 12:00, if no PTX then we will remove the chest tube.  2.) Thoracic surgery consult pending, will await Dr. Genevive Bi recommendations on whether anything need be done regarding her suspected hemothorax.   Electronically Signed: Jacqulynn Cadet, MD 02/27/2017, 9:27 AM   I spent a total of 25 Minutes at the the patient's bedside AND on the patient's hospital floor or unit, greater than 50% of which was counseling/coordinating care for right hemopneumothorax.

## 2017-02-27 NOTE — Care Management Important Message (Signed)
Important Message  Patient Details  Name: Mary Burke MRN: [id number] Date of Birth: 1945-09-01   Medicare Important Message Given:  Yes    Beverly Sessions, RN 02/27/2017, 2:20 PM

## 2017-02-27 NOTE — Progress Notes (Signed)
Discharge instructions reviewed with patient, stepson and his girlfriend in detail.  Understanding was verbalized and all questions were answered.  Chest tube removed prior to discharge by Dr. Genevive Bi.  Awaiting wheelchair for discharge.

## 2017-02-27 NOTE — Discharge Instructions (Signed)

## 2017-03-01 ENCOUNTER — Telehealth: Payer: Self-pay

## 2017-03-01 LAB — SURGICAL PATHOLOGY

## 2017-03-01 NOTE — Telephone Encounter (Signed)
I have made the 1st attempt to contact the patient or family member in charge, in order to follow up from recently being discharged from the hospital. I left a message on voicemail but I will make another attempt at a different time.  

## 2017-03-01 NOTE — Telephone Encounter (Signed)
Transition Care Management Follow-up Telephone Call  Date discharged? 02/27/2017  How have you been since you were released from the hospital? "Little sore, but not as bad, breathing okay and walking okay"  Do you understand why you were in the hospital? Yes  Do you understand the discharge instructions? Yes Where were you discharged to? Home   Items Reviewed: Medications reviewed: YES Allergies reviewed: YES Dietary changes reviewed: YES Referrals reviewed: YES   Functional Questionnaire:  Activities of Daily Living (ADLs):   States they are independent in the following: feeding, dressing, bathing, ambulating  States they require assistance with the following: transportation  Any transportation issues/concerns?: No  Any patient concerns? Concerned about the biopsy results, but understands Dr.Oaks will go over these results.   Confirmed importance and date/time of follow-up visits scheduled : Yes Provider appointment with Dr.Karamalegos on 03/06/2017 at 11:00am.   Confirmed with patient if condition begins to worsen call PCP or go to the ER.  Patient was given the office number and encouraged to call back with question or concerns.  : YES

## 2017-03-02 ENCOUNTER — Ambulatory Visit
Admission: RE | Admit: 2017-03-02 | Discharge: 2017-03-02 | Disposition: A | Discharge status: Home / Self Care | Payer: Medicare Other | Source: Ambulatory Visit | Attending: Cardiothoracic Surgery | Admitting: Cardiothoracic Surgery

## 2017-03-02 ENCOUNTER — Ambulatory Visit (INDEPENDENT_AMBULATORY_CARE_PROVIDER_SITE_OTHER): Payer: Medicare Other | Admitting: Cardiothoracic Surgery

## 2017-03-02 ENCOUNTER — Encounter: Payer: Self-pay | Admitting: Cardiothoracic Surgery

## 2017-03-02 VITALS — BP 128/79 | HR 98 | Temp 98.1°F | Resp 18 | Ht 61.0 in | Wt 170.6 lb

## 2017-03-02 DIAGNOSIS — J95811 Postprocedural pneumothorax: Secondary | ICD-10-CM | POA: Diagnosis not present

## 2017-03-02 DIAGNOSIS — I700 Atherosclerosis of aorta: Secondary | ICD-10-CM | POA: Diagnosis not present

## 2017-03-02 DIAGNOSIS — J942 Hemothorax: Secondary | ICD-10-CM | POA: Insufficient documentation

## 2017-03-02 LAB — AEROBIC/ANAEROBIC CULTURE W GRAM STAIN (SURGICAL/DEEP WOUND)
Culture: NO GROWTH
Gram Stain: NONE SEEN
Special Requests: NORMAL

## 2017-03-02 NOTE — Progress Notes (Signed)
  Patient ID: Mary Burke, female   DOB: 02/02/1946, 71 y.o.   MRN: [id number]  HISTORY: She returns today in follow-up. She has no new complaints. She is not short of breath.   Vitals:   03/02/17 1303  BP: 128/79  Pulse: 98  Resp: 18  Temp: 98.1 F (36.7 C)  SpO2: 97%     EXAM:    Resp: Lungs are clear bilaterally.  No respiratory distress, normal effort. Heart:  Regular without murmurs  Neurological: Alert and oriented to person, place, and time. Coordination normal.  Skin: Skin is warm and dry. No rash noted. No diaphoretic. No erythema. No pallor.  Psychiatric: Normal mood and affect. Normal behavior. Judgment and thought content normal.    ASSESSMENT: Final pathology is consistent with sarcoid. Independent review of her chest x-ray today shows no pneumothorax. There remains some apical capping on the right chest. Overall looks somewhat improved.   PLAN:   I did not make a return appointment for her at this time. She will follow-up with Dr. Raul Del. She also has an appointment with Dr. Marchia Bond which I told her that she could cancel as the diagnosis has now been firmly established and she will follow-up with Dr. Raul Del in pulmonary medicine. I did not make a return visit for this time would be happy to see her should the need arise    Nestor Lewandowsky, MD

## 2017-03-02 NOTE — Patient Instructions (Signed)
Please follow up with Dr.Flemming. Please call if you have questions or concerns.

## 2017-03-06 ENCOUNTER — Inpatient Hospital Stay: Payer: Medicare Other | Admitting: Family Medicine

## 2017-03-06 ENCOUNTER — Inpatient Hospital Stay: Payer: Medicare Other | Admitting: Nurse Practitioner

## 2017-03-14 ENCOUNTER — Ambulatory Visit (INDEPENDENT_AMBULATORY_CARE_PROVIDER_SITE_OTHER): Payer: Medicare Other | Admitting: Nurse Practitioner

## 2017-03-14 ENCOUNTER — Encounter: Payer: Self-pay | Admitting: Nurse Practitioner

## 2017-03-14 VITALS — BP 116/58 | HR 106 | Temp 98.6°F | Resp 18 | Ht 61.5 in | Wt 170.4 lb

## 2017-03-14 DIAGNOSIS — F4321 Adjustment disorder with depressed mood: Secondary | ICD-10-CM | POA: Insufficient documentation

## 2017-03-14 DIAGNOSIS — J95811 Postprocedural pneumothorax: Secondary | ICD-10-CM

## 2017-03-14 DIAGNOSIS — Z09 Encounter for follow-up examination after completed treatment for conditions other than malignant neoplasm: Secondary | ICD-10-CM | POA: Diagnosis not present

## 2017-03-14 DIAGNOSIS — F4320 Adjustment disorder, unspecified: Secondary | ICD-10-CM | POA: Insufficient documentation

## 2017-03-14 DIAGNOSIS — D6489 Other specified anemias: Secondary | ICD-10-CM | POA: Diagnosis not present

## 2017-03-14 NOTE — Patient Instructions (Addendum)
Mary Burke, Thank you for coming in to clinic today.  1. Make sure to have your CBC rechecked next week either here or with Dr. Raul Del to assess your anemia. - If any change in breathing status, we will repeat Chest Xray.  Call this clinic or Dr. Raul Del.    Please schedule a follow-up appointment with Cassell Smiles, AGNP. Return in about 1 month (around 04/14/2017) for Diabetes .  If you have any other questions or concerns, please feel free to call the clinic or send a message through [place]. You may also schedule an earlier appointment if necessary.  You will receive a survey after today's visit either digitally by e-mail or paper by [employer]. Your experiences and feedback matter to [place].  Please respond so we know how we are doing as we provide care for you.   Cassell Smiles, DNP, AGNP-BC Adult Gerontology Nurse Practitioner [facility]

## 2017-03-14 NOTE — Assessment & Plan Note (Signed)
Stable and expected response to death of spouse less than 1 week ago.  Pt currently coping well.  Plan: 1. Reviewed bereavement resources. 2. Encouraged pt to reach out if needed for depression. 3. Follow up as needed.

## 2017-03-14 NOTE — Progress Notes (Signed)
Subjective:    Patient ID: Mary Burke, female    DOB: 13-Feb-1946, 71 y.o.   MRN: [id number]  Mary Burke is a 71 y.o. female presenting on 03/14/2017 for Hospitalization Follow-up (lung biopsy. husband passed away x 1 week ago.  )   [facility] VISIT Hospital/Location: [facility] Date of Admission: 02/24/17 Date of Discharge: 02/27/17 Transitions of care telephone call: 03/01/17  Reason for Admission: Pulmonary biopsy - Pathology report from biopsy indicates calcification per pt. Primary (+Secondary) Diagnosis: pneumothorax, sarcoidosis   - Hospital H&P and Discharge Summary have been reviewed - Patient presents today 15 days after recent hospitalization.  Brief summary of recent course: Pt w/ sarcoidosis had abnormal findings on chest Xray.  Sarcoidosis: Managed by Dr. Raul Del.  Had bronchoscopy w/ "navigational biopsy" in [place] w/o success, so was followed on 02/24/2017 w/ needle biopsy.  Subsequent pneumothorax which required chest tube and spent 4 days in hospital. Dr. Genevive Bi followup on 8/16 w/ CXR showing that pneumothorax has resolved except for very small amount that pt states "Dr. Genevive Bi thinks body will reabsorb."   - Today reports overall has done very well after discharge. If she coughs or sneezes feels sharp pain in back.  Is able to walk and do things.  Resumed her clogging dance class yesterday.  - New medications on discharge: none - Changes to current meds on discharge: none  Grief Husband died last 2022-10-19 after long battle w/ sickness.  For pt, all of her life has been on hold for last 6 months.  Has already resumed some of her enjoyed activities.  States she has been grieving for a long time and this is not sudden.  Currently coping well w/ her husband's loss despite intermittent sadness and tearfulness.  Is connected w/ hospice and knows about their grief and bereavement counseling.  Social History  Substance Use Topics  . Smoking status: Never Smoker   . Smokeless tobacco: Never Used  . Alcohol use No    Review of Systems Per HPI unless specifically indicated above  Outpatient Encounter Prescriptions as of 03/14/2017  Medication Sig  . acetaminophen (TYLENOL) 500 MG tablet Take 1,000 mg by mouth 3 (three) times daily as needed (for pain.).  Marland Kitchen albuterol (PROVENTIL HFA;VENTOLIN HFA) 108 (90 Base) MCG/ACT inhaler Inhale 2 puffs into the lungs every 6 (six) hours as needed for wheezing or shortness of breath.  . B Complex-C (B-COMPLEX WITH VITAMIN C) tablet Take 1 tablet by mouth daily.  . Calcium Carbonate-Vit D-Min (CALCIUM 1200) 1200-1000 MG-UNIT CHEW Chew 1 tablet by mouth 2 (two) times daily.  . fluticasone (FLONASE) 50 MCG/ACT nasal spray Place 2 sprays into both nostrils daily as needed for allergies.   . fluticasone (FLOVENT HFA) 110 MCG/ACT inhaler Inhale 2 puffs into the lungs daily as needed (for respiratory issues.).   Marland Kitchen Glucosamine-Chondroitin (COSAMIN DS PO) Take 1 tablet by mouth 2 (two) times daily.  Marland Kitchen loratadine (CLARITIN) 10 MG tablet Take 10 mg by mouth every morning.   Marland Kitchen losartan (COZAAR) 25 MG tablet TAKE 1 TABLET(25 MG) BY MOUTH DAILY (Patient taking differently: Take 25 mg by mouth every morning. )  . montelukast (SINGULAIR) 10 MG tablet Take 10 mg by mouth daily with supper.  Marland Kitchen omeprazole (PRILOSEC) 40 MG capsule Take 1 capsule (40 mg total) by mouth daily. Take 1 capsule  daily. (Patient taking differently: Take 40 mg by mouth every morning. Take 1 capsule  daily.)  . Probiotic Product (PROBIOTIC PO)  Take 1 capsule by mouth daily.  . raloxifene (EVISTA) 60 MG tablet Take 1 tablet (60 mg total) by mouth daily.  . ranitidine (ZANTAC) 150 MG tablet TAKE 1 TABLET BY MOUTH AT BEDTIME  . rosuvastatin (CRESTOR) 5 MG tablet Take 1 tablet (5 mg total) by mouth at bedtime.  . sucralfate (CARAFATE) 1 g tablet Take 0.5 g by mouth 3 (three) times daily before meals.   . vitamin E 400 UNIT capsule Take 400 Units by mouth 2 (two)  times daily.   No facility-administered encounter medications on file as of 03/14/2017.       Objective:    BP (!) 116/58 (BP Location: Right Arm, Patient Position: Sitting, Cuff Size: Normal)   Pulse (!) 106   Temp 98.6 F (37 C) (Oral)   Resp 18   Ht 5' 1.5" (1.562 m)   Wt 170 lb 6.4 oz (77.3 kg)   SpO2 99%   BMI 31.68 kg/m   Wt Readings from Last 3 Encounters:  03/14/17 170 lb 6.4 oz (77.3 kg)  03/02/17 170 lb 9.6 oz (77.4 kg)  02/25/17 163 lb 2.3 oz (74 kg)    Physical Exam  General - healthy, well-appearing, NAD HEENT - Normocephalic, atraumatic Neck - supple, non-tender, no LAD, no carotid bruit Heart - RRR, no murmurs heard Lungs - Clear throughout all lobes, no wheezing, crackles, or rhonchi. Normal work of breathing.  No changes to lung sounds w/ cough. Extremeties - non-tender, no edema, cap refill < 2 seconds, peripheral pulses intact +2 bilaterally Skin - warm, dry Neuro - awake, alert, oriented x3, normal gait Psych - Normal mood and affect, normal behavior.  Tearful only when discussing loss of her husband.    Chest Xray 03/02/17: Radiology impression: no pneumothorax.  However, personal review of images shows possible low level pneumothorax of 5-10% in right lung apex.  Otherwise, unremarkable for changes from Xray on 02/25/17 where lung was fully inflated to fill chest cavity.   Results for orders placed or performed during the hospital encounter of 02/24/17  CBC  Result Value Ref Range   WBC 5.7 3.6 - 11.0 K/uL   RBC 3.47 (L) 3.80 - 5.20 MIL/uL   Hemoglobin 10.0 (L) 12.0 - 16.0 g/dL   HCT 30.2 (L) 35.0 - 47.0 %   MCV 87.0 80.0 - 100.0 fL   MCH 28.9 26.0 - 34.0 pg   MCHC 33.2 32.0 - 36.0 g/dL   RDW 14.9 (H) 11.5 - 14.5 %   Platelets 232 150 - 440 K/uL  Hemoglobin  Result Value Ref Range   Hemoglobin 10.4 (L) 12.0 - 16.0 g/dL  Glucose, capillary  Result Value Ref Range   Glucose-Capillary 98 65 - 99 mg/dL  Glucose, capillary  Result Value Ref Range     Glucose-Capillary 176 (H) 65 - 99 mg/dL      Assessment & Plan:   Problem List Items Addressed This Visit      Respiratory   Pneumothorax, post biopsy, right    Stable. Pt w/ reports of persistent right back pain w/ cough and sneeze.  Good exercise tolerance.  No reports of shortness of breath.  Plan: 1. Continue follow up w/ pulmonology as planned in 1 week. 2. No immediate concerns for persistent or worsening pneumothorax.  Possible small pneumothorax after procedure w/ last Xray on 03/02/17.  Consider repeat xray in future if change in symptoms.  Defer today w/ pulm appointment in 1 week.  Pt asked to call clinic  if changes in symptoms occur. 3. Follow up as needed.        Other   Grief reaction    Stable and expected response to death of spouse less than 1 week ago.  Pt currently coping well.  Plan: 1. Reviewed bereavement resources. 2. Encouraged pt to reach out if needed for depression. 3. Follow up as needed.       Other Visit Diagnoses    Anemia due to other cause, not classified    -  Primary   Post-procedural edema following pulmonary biopsy.  Stable at hospital discharge w/ Hgb 10.2  Plan: 1. Evaluate CBC 3 weeks after procedure (1 week from today) to assess for resolution. 2. Follow up as needed if remains anemic or has other changes in CBC.   Relevant Orders   CBC with [facility] discharge follow-up       See pneumothorax post biopsy for A/P      I have reviewed the discharge medication list, and have reconciled the current and discharge medications today.  Follow up plan: Return in about 1 month (around 04/14/2017) for Diabetes .   Cassell Smiles, DNP, AGPCNP-BC Adult Gerontology Primary Care Nurse Practitioner [facility] 03/14/2017, 8:31 AM

## 2017-03-14 NOTE — Assessment & Plan Note (Addendum)
Stable. Pt w/ reports of persistent right back pain w/ cough and sneeze.  Good exercise tolerance.  No reports of shortness of breath.  Plan: 1. Continue follow up w/ pulmonology as planned in 1 week. 2. No immediate concerns for persistent or worsening pneumothorax.  Possible small pneumothorax after procedure w/ last Xray on 03/02/17.  Consider repeat xray in future if change in symptoms.  Defer today w/ pulm appointment in 1 week.  Pt asked to call clinic if changes in symptoms occur. 3. Follow up as needed.

## 2017-03-14 NOTE — Progress Notes (Signed)
I have reviewed this encounter including the documentation in this note and/or discussed this patient with the provider, Cassell Smiles, AGPCNP-BC. I am certifying that I agree with the content of this note as supervising physician.  Nobie Putnam, [facility] 03/14/2017, 4:50 PM

## 2017-03-22 ENCOUNTER — Telehealth: Payer: Self-pay | Admitting: Nurse Practitioner

## 2017-03-22 NOTE — Telephone Encounter (Signed)
Pt. Called wanting to know if  End of September was to late to get her labs, also pt her pulmonologist states that she   Have had   Enough x-ray. Pt  Call back  # [phone]

## 2017-03-22 NOTE — Telephone Encounter (Signed)
Agree about overall Xray amount.  Repeat Xray only if worsening in respiratory status.   Labs ok for end of September.  Want to make sure she has resolution of her anemia and that timeline will be fine.  Called pt and left message.

## 2017-03-28 ENCOUNTER — Encounter: Payer: Self-pay | Admitting: Nurse Practitioner

## 2017-03-31 ENCOUNTER — Encounter: Payer: Self-pay | Admitting: Nurse Practitioner

## 2017-04-03 ENCOUNTER — Other Ambulatory Visit: Payer: Self-pay | Admitting: Nurse Practitioner

## 2017-04-03 DIAGNOSIS — Z79899 Other long term (current) drug therapy: Secondary | ICD-10-CM

## 2017-04-04 ENCOUNTER — Ambulatory Visit: Payer: Medicare Other | Admitting: Internal Medicine

## 2017-04-10 LAB — ACID FAST CULTURE WITH REFLEXED SENSITIVITIES (MYCOBACTERIA): Acid Fast Culture: NEGATIVE

## 2017-04-12 ENCOUNTER — Other Ambulatory Visit: Payer: Medicare Other

## 2017-04-12 DIAGNOSIS — Z79899 Other long term (current) drug therapy: Secondary | ICD-10-CM

## 2017-04-12 DIAGNOSIS — D6489 Other specified anemias: Secondary | ICD-10-CM

## 2017-04-14 ENCOUNTER — Ambulatory Visit (INDEPENDENT_AMBULATORY_CARE_PROVIDER_SITE_OTHER): Payer: Medicare Other | Admitting: Nurse Practitioner

## 2017-04-14 ENCOUNTER — Encounter: Payer: Self-pay | Admitting: Nurse Practitioner

## 2017-04-14 VITALS — BP 125/58 | HR 95 | Temp 97.9°F | Ht 61.5 in | Wt 171.8 lb

## 2017-04-14 DIAGNOSIS — I10 Essential (primary) hypertension: Secondary | ICD-10-CM

## 2017-04-14 DIAGNOSIS — M810 Age-related osteoporosis without current pathological fracture: Secondary | ICD-10-CM | POA: Diagnosis not present

## 2017-04-14 DIAGNOSIS — K219 Gastro-esophageal reflux disease without esophagitis: Secondary | ICD-10-CM

## 2017-04-14 DIAGNOSIS — Z23 Encounter for immunization: Secondary | ICD-10-CM | POA: Diagnosis not present

## 2017-04-14 DIAGNOSIS — Z780 Asymptomatic menopausal state: Secondary | ICD-10-CM

## 2017-04-14 DIAGNOSIS — E119 Type 2 diabetes mellitus without complications: Secondary | ICD-10-CM | POA: Diagnosis not present

## 2017-04-14 DIAGNOSIS — E782 Mixed hyperlipidemia: Secondary | ICD-10-CM

## 2017-04-14 LAB — COMPREHENSIVE METABOLIC PANEL
AG Ratio: 1.7 (calc) (ref 1.0–2.5)
ALT: 13 U/L (ref 6–29)
AST: 20 U/L (ref 10–35)
Albumin: 4 g/dL (ref 3.6–5.1)
Alkaline phosphatase (APISO): 50 U/L (ref 33–130)
BUN: 13 mg/dL (ref 7–25)
CO2: 26 mmol/L (ref 20–32)
Calcium: 8.8 mg/dL (ref 8.6–10.4)
Chloride: 106 mmol/L (ref 98–110)
Creat: 0.71 mg/dL (ref 0.50–0.99)
Globulin: 2.4 g/dL (calc) (ref 1.9–3.7)
Glucose, Bld: 111 mg/dL — ABNORMAL HIGH (ref 65–99)
Potassium: 4.1 mmol/L (ref 3.5–5.3)
Sodium: 140 mmol/L (ref 135–146)
Total Bilirubin: 0.3 mg/dL (ref 0.2–1.2)
Total Protein: 6.4 g/dL (ref 6.1–8.1)

## 2017-04-14 LAB — CBC WITH DIFFERENTIAL/PLATELET
Basophils Absolute: 70 cells/uL (ref 0–200)
Basophils Relative: 1.4 %
Eosinophils Absolute: 140 cells/uL (ref 15–500)
Eosinophils Relative: 2.8 %
HCT: 38.2 % (ref 35.0–45.0)
Hemoglobin: 11.9 g/dL (ref 11.7–15.5)
Lymphs Abs: 1635 cells/uL (ref 850–3900)
MCH: 26.7 pg — ABNORMAL LOW (ref 27.0–33.0)
MCHC: 31.2 g/dL — ABNORMAL LOW (ref 32.0–36.0)
MCV: 85.7 fL (ref 80.0–100.0)
MPV: 10.2 fL (ref 7.5–12.5)
Monocytes Relative: 13.1 %
Neutro Abs: 2500 cells/uL (ref 1500–7800)
Neutrophils Relative %: 50 %
Platelets: 382 10*3/uL (ref 140–400)
RBC: 4.46 10*6/uL (ref 3.80–5.10)
RDW: 13.1 % (ref 11.0–15.0)
Total Lymphocyte: 32.7 %
WBC mixed population: 655 cells/uL (ref 200–950)
WBC: 5 10*3/uL (ref 3.8–10.8)

## 2017-04-14 LAB — LIPID PANEL
Cholesterol: 142 mg/dL (ref <200)
HDL: 62 mg/dL (ref >50)
LDL Cholesterol (Calc): 59 mg/dL (calc)
Non-HDL Cholesterol (Calc): 80 mg/dL (calc) (ref <130)
Total CHOL/HDL Ratio: 2.3 (calc) (ref <5.0)
Triglycerides: 130 mg/dL (ref <150)

## 2017-04-14 LAB — TEST AUTHORIZATION

## 2017-04-14 LAB — POCT GLYCOSYLATED HEMOGLOBIN (HGB A1C): Hemoglobin A1C: 6

## 2017-04-14 MED ORDER — BLOOD GLUCOSE METER KIT: PACK | 0 refills | Status: DC

## 2017-04-14 MED ORDER — OMEPRAZOLE 20 MG PO CPDR: 20.0000 mg | DELAYED_RELEASE_CAPSULE | Freq: Every day | ORAL | 3 refills | Status: DC

## 2017-04-14 NOTE — Assessment & Plan Note (Signed)
Stable and currently well controlled on daily PPI omeprazole 40mg  and ranitidine at hs.  Has previously been followed by [facility].    Plan: 1. Discussed risk of bone density loss w/ PPI chronically.  Pt willing to reduce omeprazole to 20 mg once daily for two weeks as trial to reduce dose.  Can take ranitidine bid for 2 weeks if rebound reflux then take omeprazole 20 mg only once daily after this.  If reflux symptoms return, will continue on omeprazole 40 mg once daily. 2. Follow up 6 months.

## 2017-04-14 NOTE — Assessment & Plan Note (Addendum)
Well-controlled with diet and exercise only.  Today's HgbA1c 6.0, increased from 5.3.  Weight increased since May 2018. No complications or hypoglycemia.  Plan:  1. Encouraged continue healthy lifestyle, diet controlled.  Discussed low glycemic diet for working to improve A1c again.  2. POCT Hgb A1c today 3. Continue ASA 81mg , ARB, discuss statin (see hyperlipidemia) 4. Follow-up q 6 months.

## 2017-04-14 NOTE — Assessment & Plan Note (Signed)
Stable, well controlled on losartan.  - No complications  Plan: 1. Continue current regimen losartan 25 mg once daily. 2. Encouraged low salt diet. 3. Increase physical activity to 30 minutes most days of the week. 4. Follow-up q 6 months, monitor BP outside office.  Reviewed BP goal < 130/80.

## 2017-04-14 NOTE — Patient Instructions (Addendum)
Mary Burke, Thank you for coming in to clinic today.  1. For your diabetes: - Continue working on food choices - START checking blood sugar once every morning fasting.  Keep log and bring to clinic visit. - Continue being active.  2. For your blood pressure: - Great work.  You have good control.  3. Cholesterol: - STOP rosuvastatin for at least 2 weeks to see if your muscle cramps go away.  If they do, it is your statin.   - THEN, try to take 1 pill once a week.  If they return, stop the medication.  4. For your acid reflux: - TRY to reduce your omeprazole dose to 20 mg once daily.  Allow two weeks.  If you have rebound reflux, can take ranitidine twice daily for two weeks.  If you still have symptoms of heartburn, return to 40 mg once daily and take an extra calcium supplement.  5. Osteopenia: - Continue getting 1500 mg calcium daily.  (about 3 calcium supplements or 2 supplements with dietary calcium intake). - Continue your weight bearing exercise.  Please schedule a follow-up appointment with Cassell Smiles, AGNP. Return in about 6 months (around 10/12/2017) for Diabetes, Blood Pressure, GERD.  If you have any other questions or concerns, please feel free to call the clinic or send a message through [place]. You may also schedule an earlier appointment if necessary.  You will receive a survey after today's visit either digitally by e-mail or paper by [employer]. Your experiences and feedback matter to [place].  Please respond so we know how we are doing as we provide care for you.   Cassell Smiles, DNP, AGNP-BC Adult Gerontology Nurse Practitioner [place]

## 2017-04-14 NOTE — Assessment & Plan Note (Addendum)
Prior elevated LDL 115 in past, in setting of DM2 while not on statin therapy.  Rosuvastatin 5 mg once daily is causing side effect of myalgias w/ daily muscle cramping.  Plan: 1. Stop rosuvastatin 5 mg for at least 2 weeks or until myalgias resolve.  Discussed w/ pt she could resume rosuvastatin once daily.  If myalgias return, stop taking rosuvastatin.  May consider lovastatin in future, but LDL off statin was not significantly elevated.   2. Discussed lifestyle measures to impact LDL reduction.  Low glycemic handout provided.  Recommended physical activity. 3. Addon lipid panel today. 4. Follow up  6 months.

## 2017-04-14 NOTE — Progress Notes (Signed)
Subjective:    Patient ID: Mary Burke, female    DOB: 04/20/1946, 71 y.o.   MRN: [id number]  Mary Burke is a 71 y.o. female presenting on 04/14/2017 for Diabetes (follow-up) and Leg Pain (bilateral leg pain, pt concern it could be her statin medication)   HPI Diabetes Pt presents today for follow up of Type 2 diabetes mellitus. She is not checking CBG at home - Current diabetic medications include: none - She is not currently symptomatic.  - She denies polydipsia, polyphagia, polyuria, headaches, diaphoresis, shakiness, chills, pain, numbness or tingling in extremities and changes in vision.   - Clinical course has been stable. - She  reports an exercise routine that includes dancercise and clogging each 1x per wee,   days per week. And other activities. - Her diet is moderate in salt, moderate in fat, and moderate in carbohydrates. Has worsened some w/ food journal. - Weight trend: stable, but had increased - Weight goal is approx 150 lbs   PREVENTION: Eye exam current (within one year): yes Foot exam current (within one year): yes Lipid/ASCVD risk reduction - on statin: yes, but not tolerating Kidney protection - on ace or arb: arb  Recent Labs  08/09/16 0930 11/15/16 0807 04/14/17 0846  HGBA1C 6.0% 5.3 6.0    Leg Pain - Pt ? if statin med is cause.  Reports nearly daily muscle pain in legs - starts in different places.  Then, sometimes has a chain reaction w/ cramping in thighs, hamstrings, calves.  Occasionally prevents sleep. Occurrs nearly every night.  Pt is only taking rosuvastatin 5 mg once daily.   Hypertension - She is not checking BP at home or outside of clinic.    - Current medications: losartan 25 mg once daily, tolerating well without side effects - She is not currently symptomatic. - Pt denies headache, lightheadedness, dizziness, changes in vision, chest tightness/pressure, palpitations, leg swelling, sudden loss of speech or loss of  consciousness.   Osteopenia Pt has been diagnosed w/ osteopenia .   Takes two calcium and vitamin D supplements.   Avoids dairy.  Can drink almond milk.  - no falls in last 1 year - weight bearing exercise.  GERD Takes omeprazole 40 mg once daily w/ ranitidine every night.  Has used sucralfate in past.  Social History  Substance Use Topics  . Smoking status: Never Smoker  . Smokeless tobacco: Never Used  . Alcohol use No    Review of Systems Per HPI unless specifically indicated above     Objective:    BP (!) 125/58 (BP Location: Right Arm, Patient Position: Sitting, Cuff Size: Normal)   Pulse 95   Temp 97.9 F (36.6 C) (Oral)   Ht 5' 1.5" (1.562 m)   Wt 171 lb 12.8 oz (77.9 kg)   SpO2 98%   BMI 31.94 kg/m   Wt Readings from Last 3 Encounters:  04/14/17 171 lb 12.8 oz (77.9 kg)  03/14/17 170 lb 6.4 oz (77.3 kg)  03/02/17 170 lb 9.6 oz (77.4 kg)    Physical Exam  General - overweight, well-appearing, NAD HEENT - Normocephalic, atraumatic Neck - supple, non-tender, no LAD, no thyromegaly, no carotid bruit Heart - RRR, no murmurs heard Lungs - Clear throughout all lobes, no wheezing, crackles, or rhonchi. Normal work of breathing. Extremeties - non-tender, no edema, cap refill < 2 seconds, peripheral pulses intact +2 bilaterally Skin - warm, dry Neuro - awake, alert, oriented x3, normal gait Psych - Normal  mood and affect, normal behavior    Results for orders placed or performed in visit on 04/12/17  CBC with Differential/Platelet  Result Value Ref Range   WBC 5.0 3.8 - 10.8 Thousand/uL   RBC 4.46 3.80 - 5.10 Million/uL   Hemoglobin 11.9 11.7 - 15.5 g/dL   HCT 38.2 35.0 - 45.0 %   MCV 85.7 80.0 - 100.0 fL   MCH 26.7 (L) 27.0 - 33.0 pg   MCHC 31.2 (L) 32.0 - 36.0 g/dL   RDW 13.1 11.0 - 15.0 %   Platelets 382 140 - 400 Thousand/uL   MPV 10.2 7.5 - 12.5 fL   Neutro Abs 2,500 1,500 - 7,800 cells/uL   Lymphs Abs 1,635 850 - 3,900 cells/uL   WBC mixed  population 655 200 - 950 cells/uL   Eosinophils Absolute 140 15 - 500 cells/uL   Basophils Absolute 70 0 - 200 cells/uL   Neutrophils Relative % 50 %   Total Lymphocyte 32.7 %   Monocytes Relative 13.1 %   Eosinophils Relative 2.8 %   Basophils Relative 1.4 %  Comprehensive metabolic panel  Result Value Ref Range   Glucose, Bld 111 (H) 65 - 99 mg/dL   BUN 13 7 - 25 mg/dL   Creat 0.71 0.50 - 0.99 mg/dL   BUN/Creatinine Ratio NOT APPLICABLE 6 - 22 (calc)   Sodium 140 135 - 146 mmol/L   Potassium 4.1 3.5 - 5.3 mmol/L   Chloride 106 98 - 110 mmol/L   CO2 26 20 - 32 mmol/L   Calcium 8.8 8.6 - 10.4 mg/dL   Total Protein 6.4 6.1 - 8.1 g/dL   Albumin 4.0 3.6 - 5.1 g/dL   Globulin 2.4 1.9 - 3.7 g/dL (calc)   AG Ratio 1.7 1.0 - 2.5 (calc)   Total Bilirubin 0.3 0.2 - 1.2 mg/dL   Alkaline phosphatase (APISO) 50 33 - 130 U/L   AST 20 10 - 35 U/L   ALT 13 6 - 29 U/L      Assessment & Plan:   Problem List Items Addressed This Visit      Cardiovascular and Mediastinum   Essential hypertension    Stable, well controlled on losartan.  - No complications  Plan: 1. Continue current regimen losartan 25 mg once daily. 2. Encouraged low salt diet. 3. Increase physical activity to 30 minutes most days of the week. 4. Follow-up q 6 months, monitor BP outside office.  Reviewed BP goal < 130/80.        Digestive   Gastroesophageal reflux disease    Stable and currently well controlled on daily PPI omeprazole 6m and ranitidine at hs.  [facility]    Plan: 1. Discussed risk of bone density loss w/ PPI chronically.  Pt willing to reduce omeprazole to 20 mg once daily for two weeks as trial to reduce dose.  Can take ranitidine bid for 2 weeks if rebound reflux then take omeprazole 20 mg only once daily after this.  If reflux symptoms return, will continue on omeprazole 40 mg once daily. 2. Follow up 6 months.       Relevant Medications   omeprazole (PRILOSEC) 20  MG capsule     Endocrine   Controlled type 2 diabetes mellitus without complication, without long-term current use of insulin ([facility] [facility]    Well-controlled with diet and exercise only.  Today's HgbA1c 6.0, increased from 5.3.  Weight increased since May 2018. No complications or hypoglycemia.  Plan:  1. Encouraged continue healthy lifestyle, diet controlled.  Discussed low glycemic diet for working to improve A1c again.  2. POCT Hgb A1c today 3. Continue ASA 28m, ARB, discuss statin (see hyperlipidemia) 4. Follow-up q 6 months.      Relevant Medications   blood glucose meter kit and supplies   Other Relevant Orders   POCT glycosylated hemoglobin (Hb A1C) (Completed)     Musculoskeletal and Integument   Osteopenia after menopause    Pt w/ osteopenia.  Last DEXA scan 11/2015 indicating osteopenia in spine and left femoral neck. Pt is taking 2 calcium supplements daily, has had no falls in last 1 year, participates in weight bearing exercise.  Plan: 1. Continue current calcium supplementation.  Consider adding 1 additional calcium supplement since no dairy consumption. 2. Review of medications that can contribute to worsening: Consider omeprazole dose reduction.  Pt agrees (see AP GERD). 3. Follow up  1 year.        Other   Hyperlipidemia    Prior elevated LDL 115 in past, in setting of DM2 while not on statin therapy.  Rosuvastatin 5 mg once daily is causing side effect of myalgias w/ daily muscle cramping.  Plan: 1. Stop rosuvastatin 5 mg for at least 2 weeks or until myalgias resolve.  Discussed w/ pt she could resume rosuvastatin once daily.  If myalgias return, stop taking rosuvastatin.  May consider lovastatin in future, but LDL off statin was not significantly elevated.   2. Discussed lifestyle measures to impact LDL reduction.  Low glycemic handout provided.  Recommended physical activity. 3. Addon lipid panel today. 4. Follow up  6 months.         Other Visit  Diagnoses    Needs flu shot       Age > 643years.  Pt needs annual flu vaccine.  Plan: 1. Administer HighDose Fluzone today.   Relevant Orders   Flu vaccine HIGH DOSE PF (Fluzone High dose) (Completed)      Meds ordered this encounter  Medications  . blood glucose meter kit and supplies    Sig: Dispense based on patient and insurance preference. Use once daily as directed. (FOR ICD-9 250.00, 250.01).    Dispense:  1 each    Refill:  0    Order Specific Question:   Supervising Provider    Answer:   KOlin Hauser[[id number]]    Order Specific Question:   Number of strips    Answer:   100    Order Specific Question:   Number of lancets    Answer:   100  . omeprazole (PRILOSEC) 20 MG capsule    Sig: Take 1 capsule (20 mg total) by mouth daily.    Dispense:  30 capsule    Refill:  3    Order Specific Question:   Supervising Provider    Answer:   KOlin Hauser[[id number]]      Follow up plan: Return in about 6 months (around 10/12/2017) for Diabetes, Blood Pressure, GERD.  LCassell Smiles DNP, AGPCNP-BC Adult Gerontology Primary Care Nurse Practitioner [facility] 04/14/2017, 8:32 AM

## 2017-04-14 NOTE — Assessment & Plan Note (Addendum)
Pt w/ osteopenia.  Last DEXA scan 11/2015 indicating osteopenia in spine and left femoral neck. Pt is taking 2 calcium supplements daily, has had no falls in last 1 year, participates in weight bearing exercise.  Plan: 1. Continue current calcium supplementation.  Consider adding 1 additional calcium supplement since no dairy consumption. 2. Review of medications that can contribute to worsening: Consider omeprazole dose reduction.  Pt agrees (see AP GERD). 3. Follow up  1 year.

## 2017-06-23 ENCOUNTER — Other Ambulatory Visit: Payer: Self-pay | Admitting: Family Medicine

## 2017-06-27 ENCOUNTER — Other Ambulatory Visit: Payer: Self-pay

## 2017-06-27 NOTE — Patient Outreach (Signed)
[facility]) Care Management  06/27/2017  Mary Burke 04-20-1946 [id number]   Medication Adherence call to Mary Burke patient is showing past due under [employer].on Rosuvastatin 5 mg spoke to patient she said doctor took her off this medication because of side effects.she wont need rosuvastatin 5 mg any more.   [place] Management Direct Dial [phone]  Fax [phone] [email or web address]

## 2017-07-10 ENCOUNTER — Other Ambulatory Visit: Payer: Self-pay | Admitting: Nurse Practitioner

## 2017-07-25 ENCOUNTER — Other Ambulatory Visit: Payer: Self-pay | Admitting: Family Medicine

## 2017-08-01 ENCOUNTER — Telehealth: Payer: Self-pay | Admitting: Nurse Practitioner

## 2017-08-01 NOTE — Telephone Encounter (Signed)
Called pt to sched for AWV with Nurse Health Advisor. Last AWV on 05/06/15  please schedule AWV with NHA any date . C/b #  [phone] on Skype [email or web address] if you have questions

## 2017-08-08 ENCOUNTER — Other Ambulatory Visit: Payer: Self-pay | Admitting: Nurse Practitioner

## 2017-08-08 DIAGNOSIS — K219 Gastro-esophageal reflux disease without esophagitis: Secondary | ICD-10-CM

## 2017-08-15 ENCOUNTER — Ambulatory Visit (INDEPENDENT_AMBULATORY_CARE_PROVIDER_SITE_OTHER): Payer: Medicare Other

## 2017-08-15 DIAGNOSIS — Z0000 Encounter for general adult medical examination without abnormal findings: Secondary | ICD-10-CM | POA: Diagnosis not present

## 2017-08-15 NOTE — Patient Instructions (Addendum)
Mary Burke , Thank you for taking time to come for your Medicare Wellness Visit. I appreciate your ongoing commitment to your health goals. Please review the following plan we discussed and let me know if I can assist you in the future.   Screening recommendations/referrals: Colonoscopy: completed 01/29/2015 Mammogram: completed 10/04/2016  Bone Density: completed 11/17/2015 Recommended yearly ophthalmology/optometry visit for glaucoma screening and checkup Recommended yearly dental visit for hygiene and checkup  Vaccinations: Influenza vaccine: up to date Pneumococcal vaccine: up to date Tdap vaccine: up to date Shingles vaccine: completed zostavax, shingrix eligible  Advanced directives: copy on file.   Conditions/risks identified: recommend drinking at least 6-8 glasses of water a day   Next appointment: Follow up on 10/18/2017 at 8:20am with Mary Burke. Follow up in one year for your annual wellness exam.    Preventive Care 65 Years and Older, Female Preventive care refers to lifestyle choices and visits with your health care provider that can promote health and wellness. What does preventive care include?  A yearly physical exam. This is also called an annual well check.  Dental exams once or twice a year.  Routine eye exams. Ask your health care provider how often you should have your eyes checked.  Personal lifestyle choices, including:  Daily care of your teeth and gums.  Regular physical activity.  Eating a healthy diet.  Avoiding tobacco and drug use.  Limiting alcohol use.  Practicing safe sex.  Taking low-dose aspirin every day.  Taking vitamin and mineral supplements as recommended by your health care provider. What happens during an annual well check? The services and screenings done by your health care provider during your annual well check will depend on your age, overall health, lifestyle risk factors, and family history of disease. Counseling    Your health care provider may ask you questions about your:  Alcohol use.  Tobacco use.  Drug use.  Emotional well-being.  Home and relationship well-being.  Sexual activity.  Eating habits.  History of falls.  Memory and ability to understand (cognition).  Work and work Statistician.  Reproductive health. Screening  You may have the following tests or measurements:  Height, weight, and BMI.  Blood pressure.  Lipid and cholesterol levels. These may be checked every 5 years, or more frequently if you are over 88 years old.  Skin check.  Lung cancer screening. You may have this screening every year starting at age 80 if you have a 30-pack-year history of smoking and currently smoke or have quit within the past 15 years.  Fecal occult blood test (FOBT) of the stool. You may have this test every year starting at age 59.  Flexible sigmoidoscopy or colonoscopy. You may have a sigmoidoscopy every 5 years or a colonoscopy every 10 years starting at age 63.  Hepatitis C blood test.  Hepatitis B blood test.  Sexually transmitted disease (STD) testing.  Diabetes screening. This is done by checking your blood sugar (glucose) after you have not eaten for a while (fasting). You may have this done every 1-3 years.  Bone density scan. This is done to screen for osteoporosis. You may have this done starting at age 62.  Mammogram. This may be done every 1-2 years. Talk to your health care provider about how often you should have regular mammograms. Talk with your health care provider about your test results, treatment options, and if necessary, the need for more tests. Vaccines  Your health care provider may recommend certain vaccines, such  as:  Influenza vaccine. This is recommended every year.  Tetanus, diphtheria, and acellular pertussis (Tdap, Td) vaccine. You may need a Td booster every 10 years.  Zoster vaccine. You may need this after age 27.  Pneumococcal 13-valent  conjugate (PCV13) vaccine. One dose is recommended after age 101.  Pneumococcal polysaccharide (PPSV23) vaccine. One dose is recommended after age 1. Talk to your health care provider about which screenings and vaccines you need and how often you need them. This information is not intended to replace advice given to you by your health care provider. Make sure you discuss any questions you have with your health care provider. Document Released: 07/31/2015 Document Revised: 03/23/2016 Document Reviewed: 05/05/2015 Elsevier Interactive Patient Education  2017 [place] Prevention in the Home Falls can cause injuries. They can happen to people of all ages. There are many things you can do to make your home safe and to help prevent falls. What can I do on the outside of my home?  Regularly fix the edges of walkways and driveways and fix any cracks.  Remove anything that might make you trip as you walk through a door, such as a raised step or threshold.  Trim any bushes or trees on the path to your home.  Use bright outdoor lighting.  Clear any walking paths of anything that might make someone trip, such as rocks or tools.  Regularly check to see if handrails are loose or broken. Make sure that both sides of any steps have handrails.  Any raised decks and porches should have guardrails on the edges.  Have any leaves, snow, or ice cleared regularly.  Use sand or salt on walking paths during winter.  Clean up any spills in your garage right away. This includes oil or grease spills. What can I do in the bathroom?  Use night lights.  Install grab bars by the toilet and in the tub and shower. Do not use towel bars as grab bars.  Use non-skid mats or decals in the tub or shower.  If you need to sit down in the shower, use a plastic, non-slip stool.  Keep the floor dry. Clean up any water that spills on the floor as soon as it happens.  Remove soap buildup in the tub or  shower regularly.  Attach bath mats securely with double-sided non-slip rug tape.  Do not have throw rugs and other things on the floor that can make you trip. What can I do in the bedroom?  Use night lights.  Make sure that you have a light by your bed that is easy to reach.  Do not use any sheets or blankets that are too big for your bed. They should not hang down onto the floor.  Have a firm chair that has side arms. You can use this for support while you get dressed.  Do not have throw rugs and other things on the floor that can make you trip. What can I do in the kitchen?  Clean up any spills right away.  Avoid walking on wet floors.  Keep items that you use a lot in easy-to-reach places.  If you need to reach something above you, use a strong step stool that has a grab bar.  Keep electrical cords out of the way.  Do not use floor polish or wax that makes floors slippery. If you must use wax, use non-skid floor wax.  Do not have throw rugs and other things on the  floor that can make you trip. What can I do with my stairs?  Do not leave any items on the stairs.  Make sure that there are handrails on both sides of the stairs and use them. Fix handrails that are broken or loose. Make sure that handrails are as long as the stairways.  Check any carpeting to make sure that it is firmly attached to the stairs. Fix any carpet that is loose or worn.  Avoid having throw rugs at the top or bottom of the stairs. If you do have throw rugs, attach them to the floor with carpet tape.  Make sure that you have a light switch at the top of the stairs and the bottom of the stairs. If you do not have them, ask someone to add them for you. What else can I do to help prevent falls?  Wear shoes that:  Do not have high heels.  Have rubber bottoms.  Are comfortable and fit you well.  Are closed at the toe. Do not wear sandals.  If you use a stepladder:  Make sure that it is fully  opened. Do not climb a closed stepladder.  Make sure that both sides of the stepladder are locked into place.  Ask someone to hold it for you, if possible.  Clearly mark and make sure that you can see:  Any grab bars or handrails.  First and last steps.  Where the edge of each step is.  Use tools that help you move around (mobility aids) if they are needed. These include:  Canes.  Walkers.  Scooters.  Crutches.  Turn on the lights when you go into a dark area. Replace any light bulbs as soon as they burn out.  Set up your furniture so you have a clear path. Avoid moving your furniture around.  If any of your floors are uneven, fix them.  If there are any pets around you, be aware of where they are.  Review your medicines with your doctor. Some medicines can make you feel dizzy. This can increase your chance of falling. Ask your doctor what other things that you can do to help prevent falls. This information is not intended to replace advice given to you by your health care provider. Make sure you discuss any questions you have with your health care provider. Document Released: 04/30/2009 Document Revised: 12/10/2015 Document Reviewed: 08/08/2014 Elsevier Interactive Patient Education  2017 [employer].
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
## 2017-08-22 ENCOUNTER — Other Ambulatory Visit: Payer: Self-pay | Admitting: Specialist

## 2017-08-22 DIAGNOSIS — R918 Other nonspecific abnormal finding of lung field: Secondary | ICD-10-CM

## 2017-08-22 DIAGNOSIS — D869 Sarcoidosis, unspecified: Secondary | ICD-10-CM

## 2017-09-15 ENCOUNTER — Other Ambulatory Visit: Payer: Self-pay | Admitting: Nurse Practitioner

## 2017-09-15 DIAGNOSIS — K2100 Gastro-esophageal reflux disease with esophagitis, without bleeding: Secondary | ICD-10-CM

## 2017-09-15 MED ORDER — RANITIDINE HCL 150 MG PO TABS: 150.0000 mg | ORAL_TABLET | Freq: Every day | ORAL | 6 refills | Status: DC

## 2017-09-15 NOTE — Telephone Encounter (Signed)
Pt. Called  requesting a refill on her ranitidine  Mary Burke

## 2017-10-18 ENCOUNTER — Ambulatory Visit: Payer: Medicare Other | Admitting: Nurse Practitioner

## 2017-10-18 ENCOUNTER — Encounter: Payer: Self-pay | Admitting: Nurse Practitioner

## 2017-10-18 ENCOUNTER — Other Ambulatory Visit: Payer: Self-pay

## 2017-10-18 VITALS — BP 135/70 | HR 92 | Temp 97.8°F | Ht 61.0 in | Wt 173.4 lb

## 2017-10-18 DIAGNOSIS — F5104 Psychophysiologic insomnia: Secondary | ICD-10-CM

## 2017-10-18 DIAGNOSIS — E119 Type 2 diabetes mellitus without complications: Secondary | ICD-10-CM | POA: Diagnosis not present

## 2017-10-18 DIAGNOSIS — M791 Myalgia, unspecified site: Secondary | ICD-10-CM

## 2017-10-18 DIAGNOSIS — T466X5A Adverse effect of antihyperlipidemic and antiarteriosclerotic drugs, initial encounter: Secondary | ICD-10-CM

## 2017-10-18 LAB — POCT GLYCOSYLATED HEMOGLOBIN (HGB A1C): Hemoglobin A1C: 6

## 2017-10-18 NOTE — Progress Notes (Signed)
Subjective:    Patient ID: Mary Burke, adult    DOB: 1946-04-07, 72 y.o.   MRN: [id number]  Mary Burke is a 72 y.o. adult presenting on 10/18/2017 for Diabetes   HPI  Diabetes Pt presents today for follow up of Type 2 diabetes mellitus. She is not checking CBG at home - Current diabetic medications include: none - She is not currently symptomatic.  - She denies polydipsia, polyphagia, polyuria, headaches, diaphoresis, shakiness, chills, pain, numbness or tingling in extremities and changes in vision.   - Clinical course has been stable. - She  reports an exercise routine that includes walking, 2-3 days per week. - Her diet is moderate in salt, moderate in fat, and moderate in carbohydrates. - Weight trend: stable  PREVENTION: Eye exam current (within one year): no - Due 07/2016 Foot exam current (within one year): no due today Lipid/ASCVD risk reduction - on statin: no - myalgias and intolerance to statins Kidney protection - on ace or arb: yes Recent Labs    11/15/16 0807 04/14/17 0846  HGBA1C 5.3 6.0     Insomnia Pt reports difficulty with sleep, stating "I just cannot turn off my brain."  Pt avoids going to bed until sleepy, but this means she falls asleep often late into the morning hours (2-3 am or later).  She wants to be awake at sunrise not long before sunrise.   This pattern has been practiced for several months. - Feels tired, has aches and pains because of not being rested. - Pt is getting 3.5 -4.5 hours sleep nightly. - She occasionally naps during day, but not regularly as she is still active with daily responsibilities. - Increased life stressors recently are contributing some to her racing thoughts and anxieties. - She is not currently happy with current level of sleep.  Social History   Tobacco Use  . Smoking status: Never Smoker  . Smokeless tobacco: Never Used  Substance Use Topics  . Alcohol use: No  . Drug use: No    Review of  Systems Per HPI unless specifically indicated above     Objective:    BP 135/70 (BP Location: Right Arm, Patient Position: Sitting, Cuff Size: Normal)   Pulse 92   Temp 97.8 F (36.6 C) (Oral)   Ht 5\' 1"  (1.549 m)   Wt 173 lb 6.4 oz (78.7 kg)   BMI 32.76 kg/m   Wt Readings from Last 3 Encounters:  10/18/17 173 lb 6.4 oz (78.7 kg)  08/15/17 173 lb 6.4 oz (78.7 kg)  04/14/17 171 lb 12.8 oz (77.9 kg)    Physical Exam  Constitutional: She is oriented to person, place, and time. She appears well-developed and well-nourished. No distress.  HENT:  Head: Normocephalic and atraumatic.  Eyes: Conjunctivae are normal. No scleral icterus.  Neck: Normal range of motion. Neck supple. Carotid bruit is not present. No thyromegaly present.  Cardiovascular: Normal rate, regular rhythm, S1 normal, S2 normal, normal heart sounds and intact distal pulses. Exam reveals no gallop and no friction rub.  No murmur heard. Pulmonary/Chest: Effort normal and breath sounds normal. No respiratory distress.  Musculoskeletal: She exhibits no edema (pedal).  Lymphadenopathy:    She has no cervical adenopathy.  Neurological: She is alert and oriented to person, place, and time.  Skin: Skin is warm and dry.  Psychiatric: She has a normal mood and affect. Her speech is normal and behavior is normal. Judgment and thought content normal.  Vitals reviewed.  Results for orders placed or performed in visit on 04/14/17  POCT glycosylated hemoglobin (Hb A1C)  Result Value Ref Range   Hemoglobin A1C 6.0       Assessment & Plan:   Problem List Items Addressed This Visit      Endocrine   Controlled type 2 diabetes mellitus without complication, without long-term current use of insulin ([place]) - Primary Diet and lifestyle controlled DM with A1c 6.0% mildly increased but still below goal A1c < 7.0%. - Complications - hyperlipidemia  Plan:  1. Continue current therapy: lifestyle management 2. Encourage improved  lifestyle: - low carb/low glycemic diet reinforced prior education - Increase physical activity to 30 minutes most days of the week.  Explained that increased physical activity increases body's use of sugar for energy. 3. Check fasting am CBG.  Bring log to next visit for review 4. Continue ASA and ARB 5. DM Foot exam done today with normal findings.   and Advised to schedule DM ophtho exam, send record. 6. Follow-up 6 months given very stable DMT2.    Relevant Orders   POCT glycosylated hemoglobin (Hb A1C) (Completed)     Other   Myalgia due to statin Pt has documented statin intolerance with increased myalgias.  She has been unable to take or tolerate rosuvastatin in past and is not willing to take a statin again.    Other Visit Diagnoses    Psychophysiological insomnia     Subacute to chronic insomnia now contributing to daytime sleepiness per patient and dissatisfaction with sleep routine.  Anxiety and racing thoughts are contributing to insomnia and prevent onset of sleep.  Plan: 1. Encouraged adequate sleep hygiene.  Provided handout with tips for promoting quality sleep. 2. Offered option of pharmaceutical treatment, but pt declines today. - Discussed use of melatonin with USP or other verified seal for about 2 weeks for circadian rhythm reset.  Pt agrees to try this before other pharmaceutical options. 3. Sleep cycle with aging does change and discussed this with patient.   4. Encouraged stress management and mindfulness as options for helping with racing thoughts. 5. Encouraged adequate daily physical activity and healthy diet. 6. Followup 6 months or sooner if needed.       Follow up plan: Return in about 6 months (around 04/19/2018).  Cassell Smiles, DNP, AGPCNP-BC Adult Gerontology Primary Care Nurse Practitioner [facility] 10/18/2017, 8:49 AM

## 2017-10-18 NOTE — Patient Instructions (Addendum)
Mary Burke,   Thank you for coming in to clinic today.  1. You are doing very well with your  2. Insomnia:  - Try melatonin 1/2 tablet about 30 minutes before your desired sleep time.  Take nightly for 7-14 days to reset your sleep cycle. - Work on other Contractor. - Lavender or chamomile tea may also be helpful.   Please schedule a follow-up appointment with Cassell Smiles, AGNP. Return in about 6 months (around 04/19/2018).  If you have any other questions or concerns, please feel free to call the clinic or send a message through [place]. You may also schedule an earlier appointment if necessary.  You will receive a survey after today's visit either digitally by e-mail or paper by [employer]. Your experiences and feedback matter to [place].  Please respond so we know how we are doing as we provide care for you.   Cassell Smiles, DNP, AGNP-BC Adult Gerontology Nurse Practitioner [facility], [facility]  Sleep Hygiene Tips  Take medicines only as directed by your health care provider.  Keep regular sleeping and waking hours. Avoid naps.  Keep a sleep diary to help you and your health care provider figure out what could be causing your insomnia. Include:  When you sleep.  When you wake up during the night.  How well you sleep.  How rested you feel the next day.  Any side effects of medicines you are taking.  What you eat and drink.  Make your bedroom a comfortable place where it is easy to fall asleep:  Put up shades or special blackout curtains to block light from outside.  Use a white noise machine to block noise.  Keep the temperature cool.  Exercise regularly as directed by your health care provider. Avoid exercising right before bedtime.  Use relaxation techniques to manage stress. Ask your health care provider to suggest some techniques that may work well for you. These may include:  Breathing exercises.  Routines to release  muscle tension.  Visualizing peaceful scenes.  Cut back on alcohol, caffeinated beverages, and cigarettes, especially close to bedtime. These can disrupt your sleep.  Do not overeat or eat spicy foods right before bedtime. This can lead to digestive discomfort that can make it hard for you to sleep.  Limit screen use before bedtime. This includes:  Watching TV.  Using your smartphone, tablet, and computer.  Stick to a routine. This can help you fall asleep faster. Try to do a quiet activity, brush your teeth, and go to bed at the same time each night.  Get out of bed if you are still awake after 15 minutes of trying to sleep. Keep the lights down, but try reading or doing a quiet activity. When you feel sleepy, go back to bed.  Make sure that you drive carefully. Avoid driving if you feel very sleepy.  Keep all follow-up appointments as directed by your health care provider. This is important.

## 2017-11-07 ENCOUNTER — Ambulatory Visit: Payer: Medicare Other | Admitting: Podiatry

## 2017-11-07 ENCOUNTER — Encounter: Payer: Self-pay | Admitting: Nurse Practitioner

## 2017-11-07 ENCOUNTER — Encounter: Payer: Self-pay | Admitting: Podiatry

## 2017-11-07 DIAGNOSIS — L600 Ingrowing nail: Secondary | ICD-10-CM | POA: Diagnosis not present

## 2017-11-07 DIAGNOSIS — M791 Myalgia, unspecified site: Secondary | ICD-10-CM | POA: Insufficient documentation

## 2017-11-07 DIAGNOSIS — T466X5A Adverse effect of antihyperlipidemic and antiarteriosclerotic drugs, initial encounter: Secondary | ICD-10-CM | POA: Insufficient documentation
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
## 2017-11-21 ENCOUNTER — Encounter: Payer: Self-pay | Admitting: Podiatry

## 2017-11-21 ENCOUNTER — Ambulatory Visit (INDEPENDENT_AMBULATORY_CARE_PROVIDER_SITE_OTHER): Payer: Medicare Other | Admitting: Podiatry

## 2017-11-21 ENCOUNTER — Ambulatory Visit: Payer: Medicare Other | Admitting: Podiatry

## 2017-11-21 DIAGNOSIS — L600 Ingrowing nail: Secondary | ICD-10-CM | POA: Diagnosis not present
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
## 2017-12-20 ENCOUNTER — Other Ambulatory Visit: Payer: Self-pay | Admitting: Family Medicine

## 2017-12-20 DIAGNOSIS — Z780 Asymptomatic menopausal state: Secondary | ICD-10-CM

## 2017-12-20 DIAGNOSIS — M810 Age-related osteoporosis without current pathological fracture: Principal | ICD-10-CM

## 2017-12-20 DIAGNOSIS — M8580 Other specified disorders of bone density and structure, unspecified site: Secondary | ICD-10-CM

## 2018-01-17 ENCOUNTER — Other Ambulatory Visit: Payer: Self-pay | Admitting: Nurse Practitioner

## 2018-01-22 ENCOUNTER — Ambulatory Visit
Admission: RE | Admit: 2018-01-22 | Discharge: 2018-01-22 | Disposition: A | Discharge status: Home / Self Care | Payer: Medicare Other | Source: Ambulatory Visit | Attending: Specialist | Admitting: Specialist

## 2018-01-22 DIAGNOSIS — I700 Atherosclerosis of aorta: Secondary | ICD-10-CM | POA: Insufficient documentation

## 2018-01-22 DIAGNOSIS — K8020 Calculus of gallbladder without cholecystitis without obstruction: Secondary | ICD-10-CM | POA: Insufficient documentation

## 2018-01-22 DIAGNOSIS — R918 Other nonspecific abnormal finding of lung field: Secondary | ICD-10-CM | POA: Diagnosis not present

## 2018-01-22 DIAGNOSIS — D869 Sarcoidosis, unspecified: Secondary | ICD-10-CM

## 2018-02-02 ENCOUNTER — Other Ambulatory Visit: Payer: Self-pay | Admitting: Nurse Practitioner

## 2018-02-02 DIAGNOSIS — K219 Gastro-esophageal reflux disease without esophagitis: Secondary | ICD-10-CM

## 2018-03-10 ENCOUNTER — Other Ambulatory Visit: Payer: Self-pay | Admitting: Nurse Practitioner

## 2018-03-10 DIAGNOSIS — K2100 Gastro-esophageal reflux disease with esophagitis, without bleeding: Secondary | ICD-10-CM

## 2018-03-16 ENCOUNTER — Encounter: Payer: Self-pay | Admitting: Nurse Practitioner

## 2018-03-16 DIAGNOSIS — I700 Atherosclerosis of aorta: Secondary | ICD-10-CM | POA: Insufficient documentation

## 2018-03-16 LAB — HM DIABETES EYE EXAM

## 2018-03-19 ENCOUNTER — Other Ambulatory Visit: Payer: Self-pay | Admitting: Nurse Practitioner

## 2018-03-19 DIAGNOSIS — Z780 Asymptomatic menopausal state: Secondary | ICD-10-CM

## 2018-03-19 DIAGNOSIS — M810 Age-related osteoporosis without current pathological fracture: Principal | ICD-10-CM

## 2018-04-07 ENCOUNTER — Other Ambulatory Visit: Payer: Self-pay | Admitting: Nurse Practitioner

## 2018-04-07 DIAGNOSIS — K2100 Gastro-esophageal reflux disease with esophagitis, without bleeding: Secondary | ICD-10-CM

## 2018-04-09 MED ORDER — RANITIDINE HCL 150 MG PO TABS
ORAL_TABLET | ORAL | 1 refills | Status: DC
Start: 2018-04-09 — End: 2018-04-23

## 2018-04-16 ENCOUNTER — Other Ambulatory Visit: Payer: Self-pay | Admitting: Nurse Practitioner

## 2018-04-16 DIAGNOSIS — M8580 Other specified disorders of bone density and structure, unspecified site: Secondary | ICD-10-CM

## 2018-04-16 DIAGNOSIS — M810 Age-related osteoporosis without current pathological fracture: Principal | ICD-10-CM

## 2018-04-16 DIAGNOSIS — Z780 Asymptomatic menopausal state: Secondary | ICD-10-CM

## 2018-04-23 ENCOUNTER — Other Ambulatory Visit: Payer: Self-pay

## 2018-04-23 ENCOUNTER — Ambulatory Visit: Payer: Medicare Other | Admitting: Nurse Practitioner

## 2018-04-23 ENCOUNTER — Encounter: Payer: Self-pay | Admitting: Nurse Practitioner

## 2018-04-23 VITALS — BP 134/67 | HR 84 | Temp 98.0°F | Ht 61.0 in | Wt 175.2 lb

## 2018-04-23 DIAGNOSIS — E119 Type 2 diabetes mellitus without complications: Secondary | ICD-10-CM | POA: Diagnosis not present

## 2018-04-23 DIAGNOSIS — I700 Atherosclerosis of aorta: Secondary | ICD-10-CM

## 2018-04-23 DIAGNOSIS — K2100 Gastro-esophageal reflux disease with esophagitis, without bleeding: Secondary | ICD-10-CM

## 2018-04-23 DIAGNOSIS — M8589 Other specified disorders of bone density and structure, multiple sites: Secondary | ICD-10-CM

## 2018-04-23 DIAGNOSIS — Z1239 Encounter for other screening for malignant neoplasm of breast: Secondary | ICD-10-CM

## 2018-04-23 DIAGNOSIS — E782 Mixed hyperlipidemia: Secondary | ICD-10-CM | POA: Diagnosis not present

## 2018-04-23 LAB — POCT GLYCOSYLATED HEMOGLOBIN (HGB A1C): Hemoglobin A1C: 6.1 % — AB (ref 4.0–5.6)

## 2018-04-23 MED ORDER — FAMOTIDINE 20 MG PO TABS: 20.0000 mg | ORAL_TABLET | Freq: Every day | ORAL | 1 refills | Status: DC | PRN

## 2018-04-23 NOTE — Patient Instructions (Addendum)
Mary Burke,   Thank you for coming in to clinic today.  1. STOP ranitidine.   START famotidine 20 mg as needed for heartburn in evenings.  2. Continue Evista for now.  We may stop this as it is no longer recommended for osteopenia after your bone density scan.  3. Your mammogram and bone density orders has been placed.  Call the Scheduling phone number at [phone] to schedule your mammogram at your convenience.  You can choose to go to either location listed below.  Let the scheduler know which location you prefer.  [place]  [place], [place] [address]   [facility] [address] [place], [place] [address]  4. SET a plan in place to start your work toward food logging, weight loss work.  Make a plan for one step at a time about every 1-2 weeks.  Please schedule a follow-up appointment with Cassell Smiles, AGNP. Return in about 6 months (around 10/23/2018) for diabetes, hyperlipidemia.  If you have any other questions or concerns, please feel free to call the clinic or send a message through [place]. You may also schedule an earlier appointment if necessary.  You will receive a survey after today's visit either digitally by e-mail or paper by [employer]. Your experiences and feedback matter to [place].  Please respond so we know how we are doing as we provide care for you.   Cassell Smiles, DNP, AGNP-BC Adult Gerontology Nurse Practitioner [place]

## 2018-04-23 NOTE — Progress Notes (Signed)
Subjective:    Patient ID: Mary Burke, adult    DOB: 1945-12-17, 72 y.o.   MRN: [id number]  Mary Burke is a 72 y.o. adult presenting on 04/23/2018 for Diabetes   HPI Diabetes Pt presents today for follow up of Type 2 diabetes mellitus. She is not checking CBG at home.  States she has been in a slump, not checking her blood sugars. - Not currently on any diabetic medications - She is not currently symptomatic.  - She denies polydipsia, polyphagia, polyuria, headaches, diaphoresis, shakiness, chills, pain, numbness or tingling in extremities and changes in vision.   - Clinical course has been stable. - She  reports an exercise routine that includes walking 5-6,   days per week. - Her diet is moderate in salt, moderate in fat, and moderate in carbohydrates. - Weight trend: increasing steadily  PREVENTION: Eye exam current (within one year): yes Foot exam current (within one year): yes  Lipid/ASCVD risk reduction - on statin: yes Kidney protection - on ace or arb: yes Recent Labs    10/18/17 0852  HGBA1C 6.0   Osteopenia  Post-menopausal: Raloxifene started to prevent progression by Dr Luan Pulling- no breast cancer history - Is staying active in groups and activities, wants to appear health, vital, not tired, not out of breath.  Hyperlipidemia with known aortic atherosclerosis Mother had ministrokes, no other fam history of ASCVD events. Pt denies changes in vision, chest tightness/pressure, palpitations, shortness of breath, leg pain while walking, leg or arm weakness, and sudden loss of speech or loss of consciousness.    Social History   Tobacco Use  . Smoking status: Never Smoker  . Smokeless tobacco: Never Used  Substance Use Topics  . Alcohol use: No  . Drug use: No    Review of Systems Per HPI unless specifically indicated above     Objective:    BP 134/67 (BP Location: Right Arm)   Pulse 84   Temp 98 F (36.7 C) (Oral)   Ht 5\' 1"  (1.549 m)    Wt 175 lb 3.2 oz (79.5 kg)   BMI 33.10 kg/m   Wt Readings from Last 3 Encounters:  04/23/18 175 lb 3.2 oz (79.5 kg)  10/18/17 173 lb 6.4 oz (78.7 kg)  08/15/17 173 lb 6.4 oz (78.7 kg)    Physical Exam  Constitutional: She is oriented to person, place, and time. She appears well-developed and well-nourished. No distress.  HENT:  Head: Normocephalic and atraumatic.  Neck: Normal range of motion. Neck supple. Carotid bruit is not present. No thyromegaly present.  Cardiovascular: Normal rate, regular rhythm, S1 normal, S2 normal, normal heart sounds and intact distal pulses.  Pulmonary/Chest: Effort normal and breath sounds normal. No respiratory distress.  Abdominal: Soft. Bowel sounds are normal. She exhibits no distension. There is no hepatosplenomegaly. There is no tenderness. No hernia.  Musculoskeletal: She exhibits no edema (pedal).  Neurological: She is alert and oriented to person, place, and time. She has normal strength and normal reflexes. No cranial nerve deficit or sensory deficit. She displays a negative Romberg sign. Gait normal.  Skin: Skin is warm and dry.  Psychiatric: She has a normal mood and affect. Her behavior is normal.  Vitals reviewed.  Results for orders placed or performed in visit on 03/16/18  HM DIABETES EYE EXAM  Result Value Ref Range   HM Diabetic Eye Exam No Retinopathy No Retinopathy      Assessment & Plan:   Problem List Items Addressed  This Visit      Cardiovascular and Mediastinum   Aortic atherosclerosis ([place]) Patient with known ASCVD and intolerance to statin at full dose.  - Continue max tolerated statin - START Zetia - Follow-up 6 months.   Relevant Medications   ezetimibe (ZETIA) 10 MG tablet     Digestive   Gastroesophageal reflux disease Stable today on exam.  Medications tolerated without side effects.  Continue at current doses.  Refills provided.   . Followup 6 months.    Relevant Medications   famotidine (PEPCID) 20 MG  tablet   Other Relevant Orders   CBC with Differential/Platelet (Completed)     Endocrine   Controlled type 2 diabetes mellitus without complication, without long-term current use of insulin ([place]) - Primary Stable today on exam.  Medications not needed at this time.  Continue with lifestyle improvements - return to good habits.   .  Check labs today. Followup 6 months.    Relevant Orders   POCT glycosylated hemoglobin (Hb A1C) (Completed)   COMPLETE METABOLIC PANEL WITH GFR (Completed)     Other   Hyperlipidemia  See AP aortic ASCVD above   Relevant Medications   ezetimibe (ZETIA) 10 MG tablet   Other Relevant Orders   Lipid panel (Completed)   TSH (Completed)    Other Visit Diagnoses    Osteopenia of multiple sites     Unknown status.   Has had no falls or fractures in last year.  Will proceed with BMD and consider stopping Raloxifene if stable or worsening.  If significantly improved, would be reason to continue.  Continue with vitamin D and calcium supplementation, regular physical activity.  Follow-up 1-2 years with repeat BMD.   Relevant Orders   DG Bone Density (Completed)   Breast cancer screening     Pt last mammogram > 2 years ago.  Plan: 1. Screening mammogram order placed.  Pt will call to schedule appointment.  Information given.     Relevant Orders   MM 3D SCREEN BREAST BILATERAL (Completed)      Meds ordered this encounter  Medications  . famotidine (PEPCID) 20 MG tablet    Sig: Take 1 tablet (20 mg total) by mouth daily as needed for heartburn or indigestion.    Dispense:  90 tablet    Refill:  1    Order Specific Question:   Supervising Provider    Answer:   Olin Hauser [[id number]]    Follow up plan: Return in about 6 months (around 10/23/2018) for diabetes, hyperlipidemia.  Cassell Smiles, DNP, AGPCNP-BC Adult Gerontology Primary Care Nurse Practitioner [facility] 04/23/2018, 8:18 AM

## 2018-04-24 LAB — COMPLETE METABOLIC PANEL WITH GFR
AG Ratio: 1.7 (calc) (ref 1.0–2.5)
ALT: 12 U/L (ref 6–29)
AST: 18 U/L (ref 10–35)
Albumin: 4 g/dL (ref 3.6–5.1)
Alkaline phosphatase (APISO): 48 U/L (ref 33–130)
BUN: 13 mg/dL (ref 7–25)
CO2: 31 mmol/L (ref 20–32)
Calcium: 9.3 mg/dL (ref 8.6–10.4)
Chloride: 104 mmol/L (ref 98–110)
Creat: 0.59 mg/dL (ref 0.50–0.99)
GFR, Est African American: 108 mL/min/{1.73_m2} (ref >=60)
GFR, Est Non African American: 94 mL/min/{1.73_m2} (ref >=60)
Globulin: 2.4 g/dL (calc) (ref 1.9–3.7)
Glucose, Bld: 90 mg/dL (ref 65–99)
Potassium: 4.5 mmol/L (ref 3.5–5.3)
Sodium: 140 mmol/L (ref 135–146)
Total Bilirubin: 0.3 mg/dL (ref 0.2–1.2)
Total Protein: 6.4 g/dL (ref 6.1–8.1)

## 2018-04-24 LAB — CBC WITH DIFFERENTIAL/PLATELET
Basophils Absolute: 81 cells/uL (ref 0–200)
Basophils Relative: 1.3 %
Eosinophils Absolute: 149 cells/uL (ref 15–500)
Eosinophils Relative: 2.4 %
HCT: 38.8 % (ref 35.0–45.0)
Hemoglobin: 12.9 g/dL (ref 11.7–15.5)
Lymphs Abs: 1736 cells/uL (ref 850–3900)
MCH: 28.7 pg (ref 27.0–33.0)
MCHC: 33.2 g/dL (ref 32.0–36.0)
MCV: 86.2 fL (ref 80.0–100.0)
MPV: 10.4 fL (ref 7.5–12.5)
Monocytes Relative: 12.8 %
Neutro Abs: 3441 cells/uL (ref 1500–7800)
Neutrophils Relative %: 55.5 %
Platelets: 362 10*3/uL (ref 140–400)
RBC: 4.5 10*6/uL (ref 3.80–5.10)
RDW: 13.4 % (ref 11.0–15.0)
Total Lymphocyte: 28 %
WBC mixed population: 794 cells/uL (ref 200–950)
WBC: 6.2 10*3/uL (ref 3.8–10.8)

## 2018-04-24 LAB — LIPID PANEL
Cholesterol: 207 mg/dL — ABNORMAL HIGH (ref <200)
HDL: 56 mg/dL (ref >50)
LDL Cholesterol (Calc): 123 mg/dL (calc) — ABNORMAL HIGH
Non-HDL Cholesterol (Calc): 151 mg/dL (calc) — ABNORMAL HIGH (ref <130)
Total CHOL/HDL Ratio: 3.7 (calc) (ref <5.0)
Triglycerides: 159 mg/dL — ABNORMAL HIGH (ref <150)

## 2018-04-24 LAB — TSH: TSH: 1.55 mIU/L (ref 0.40–4.50)

## 2018-04-25 MED ORDER — EZETIMIBE 10 MG PO TABS: 10.0000 mg | ORAL_TABLET | ORAL | 3 refills | Status: DC

## 2018-05-01 ENCOUNTER — Other Ambulatory Visit: Payer: Self-pay | Admitting: Family Medicine

## 2018-05-01 DIAGNOSIS — K219 Gastro-esophageal reflux disease without esophagitis: Secondary | ICD-10-CM

## 2018-05-14 ENCOUNTER — Other Ambulatory Visit: Payer: Self-pay | Admitting: Nurse Practitioner

## 2018-05-14 DIAGNOSIS — Z780 Asymptomatic menopausal state: Secondary | ICD-10-CM

## 2018-05-14 DIAGNOSIS — M810 Age-related osteoporosis without current pathological fracture: Principal | ICD-10-CM

## 2018-05-21 ENCOUNTER — Ambulatory Visit
Admission: RE | Admit: 2018-05-21 | Discharge: 2018-05-21 | Disposition: A | Discharge status: Home / Self Care | Payer: Medicare Other | Source: Ambulatory Visit | Attending: Nurse Practitioner | Admitting: Nurse Practitioner

## 2018-05-21 DIAGNOSIS — Z1239 Encounter for other screening for malignant neoplasm of breast: Secondary | ICD-10-CM

## 2018-05-21 DIAGNOSIS — M85851 Other specified disorders of bone density and structure, right thigh: Secondary | ICD-10-CM | POA: Diagnosis not present

## 2018-05-21 DIAGNOSIS — Z1231 Encounter for screening mammogram for malignant neoplasm of breast: Secondary | ICD-10-CM | POA: Insufficient documentation

## 2018-05-21 DIAGNOSIS — Z13820 Encounter for screening for osteoporosis: Secondary | ICD-10-CM | POA: Diagnosis not present

## 2018-05-21 DIAGNOSIS — Z79810 Long term (current) use of selective estrogen receptor modulators (SERMs): Secondary | ICD-10-CM | POA: Insufficient documentation

## 2018-05-21 DIAGNOSIS — M8589 Other specified disorders of bone density and structure, multiple sites: Secondary | ICD-10-CM

## 2018-05-22 ENCOUNTER — Telehealth: Payer: Self-pay | Admitting: Nurse Practitioner

## 2018-05-22 NOTE — Telephone Encounter (Signed)
Patient states she will not have annual coverage for repeat bone density.  Can monitor again in 2 years without problem.  - STOP raloxifene - Reinforced weight-bearing exercises including with arms and regular calcium and vitamin D consumption.  Take 1500 mg per day in divided doses or less if in combination with diet for total of 1500 mg calcium per day.  Ezetimibe is causing joint aches limiting physical activity.  After 4 weeks of therapy, patient is still having these symptoms without change.   - STOP ezetimibe for 2-4 weeks or until symptoms are gone. - THEN, take ezetimibe 5 mg once weekly for 2 weeks.  If not having arthralgias, can increase to twice weekly and continue until next appointment.  Patient verbalizes understanding of all above instructions.

## 2018-05-22 NOTE — Telephone Encounter (Signed)
Pt return your call requesting that you call her after 5 today for her Bone density. Pt call back # is [phone]

## 2018-06-12 ENCOUNTER — Other Ambulatory Visit: Payer: Self-pay | Admitting: Nurse Practitioner

## 2018-06-12 DIAGNOSIS — Z780 Asymptomatic menopausal state: Secondary | ICD-10-CM

## 2018-06-12 DIAGNOSIS — M810 Age-related osteoporosis without current pathological fracture: Principal | ICD-10-CM

## 2018-06-13 ENCOUNTER — Encounter: Payer: Self-pay | Admitting: Nurse Practitioner

## 2018-07-12 ENCOUNTER — Other Ambulatory Visit: Payer: Self-pay | Admitting: Nurse Practitioner

## 2018-07-12 DIAGNOSIS — I10 Essential (primary) hypertension: Secondary | ICD-10-CM

## 2018-08-21 ENCOUNTER — Ambulatory Visit: Payer: Self-pay

## 2018-08-21 ENCOUNTER — Ambulatory Visit (INDEPENDENT_AMBULATORY_CARE_PROVIDER_SITE_OTHER): Payer: Medicare Other

## 2018-08-21 DIAGNOSIS — Z0000 Encounter for general adult medical examination without abnormal findings: Secondary | ICD-10-CM | POA: Diagnosis not present

## 2018-08-21 NOTE — Patient Instructions (Signed)
Mary Burke , Thank you for taking time to come for your Medicare Wellness Visit. I appreciate your ongoing commitment to your health goals. Please review the following plan we discussed and let me know if I can assist you in the future.   Screening recommendations/referrals: Colonoscopy: completed 7/142016 Mammogram: completed 05/21/2018 Bone Density: completed 05/21/2018 Recommended yearly ophthalmology/optometry visit for glaucoma screening and checkup Recommended yearly dental visit for hygiene and checkup  Vaccinations: Influenza vaccine: up to date Pneumococcal vaccine: up to date Tdap vaccine: up to date Shingles vaccine: completed series     Advanced directives: copy on file  Conditions/risks identified: continue the good work with your diet and exercise.   Next appointment: Follow up in one year for your annual wellness exam.    Preventive Care 71 Years and Older, Female Preventive care refers to lifestyle choices and visits with your health care provider that can promote health and wellness. What does preventive care include?  A yearly physical exam. This is also called an annual well check.  Dental exams once or twice a year.  Routine eye exams. Ask your health care provider how often you should have your eyes checked.  Personal lifestyle choices, including:  Daily care of your teeth and gums.  Regular physical activity.  Eating a healthy diet.  Avoiding tobacco and drug use.  Limiting alcohol use.  Practicing safe sex.  Taking low-dose aspirin every day.  Taking vitamin and mineral supplements as recommended by your health care provider. What happens during an annual well check? The services and screenings done by your health care provider during your annual well check will depend on your age, overall health, lifestyle risk factors, and family history of disease. Counseling  Your health care provider may ask you questions about your:  Alcohol  use.  Tobacco use.  Drug use.  Emotional well-being.  Home and relationship well-being.  Sexual activity.  Eating habits.  History of falls.  Memory and ability to understand (cognition).  Work and work Statistician.  Reproductive health. Screening  You may have the following tests or measurements:  Height, weight, and BMI.  Blood pressure.  Lipid and cholesterol levels. These may be checked every 5 years, or more frequently if you are over 56 years old.  Skin check.  Lung cancer screening. You may have this screening every year starting at age 71 if you have a 30-pack-year history of smoking and currently smoke or have quit within the past 15 years.  Fecal occult blood test (FOBT) of the stool. You may have this test every year starting at age 71.  Flexible sigmoidoscopy or colonoscopy. You may have a sigmoidoscopy every 5 years or a colonoscopy every 10 years starting at age 80.  Hepatitis C blood test.  Hepatitis B blood test.  Sexually transmitted disease (STD) testing.  Diabetes screening. This is done by checking your blood sugar (glucose) after you have not eaten for a while (fasting). You may have this done every 1-3 years.  Bone density scan. This is done to screen for osteoporosis. You may have this done starting at age 71.  Mammogram. This may be done every 1-2 years. Talk to your health care provider about how often you should have regular mammograms. Talk with your health care provider about your test results, treatment options, and if necessary, the need for more tests. Vaccines  Your health care provider may recommend certain vaccines, such as:  Influenza vaccine. This is recommended every year.  Tetanus, diphtheria,  and acellular pertussis (Tdap, Td) vaccine. You may need a Td booster every 10 years.  Zoster vaccine. You may need this after age 71.  Pneumococcal 13-valent conjugate (PCV13) vaccine. One dose is recommended after age  71.  Pneumococcal polysaccharide (PPSV23) vaccine. One dose is recommended after age 71. Talk to your health care provider about which screenings and vaccines you need and how often you need them. This information is not intended to replace advice given to you by your health care provider. Make sure you discuss any questions you have with your health care provider. Document Released: 07/31/2015 Document Revised: 03/23/2016 Document Reviewed: 05/05/2015 Elsevier Interactive Patient Education  2017 [employer] Prevention in the Home Falls can cause injuries. They can happen to people of all ages. There are many things you can do to make your home safe and to help prevent falls. What can I do on the outside of my home?  Regularly fix the edges of walkways and driveways and fix any cracks.  Remove anything that might make you trip as you walk through a door, such as a raised step or threshold.  Trim any bushes or trees on the path to your home.  Use bright outdoor lighting.  Clear any walking paths of anything that might make someone trip, such as rocks or tools.  Regularly check to see if handrails are loose or broken. Make sure that both sides of any steps have handrails.  Any raised decks and porches should have guardrails on the edges.  Have any leaves, snow, or ice cleared regularly.  Use sand or salt on walking paths during winter.  Clean up any spills in your garage right away. This includes oil or grease spills. What can I do in the bathroom?  Use night lights.  Install grab bars by the toilet and in the tub and shower. Do not use towel bars as grab bars.  Use non-skid mats or decals in the tub or shower.  If you need to sit down in the shower, use a plastic, non-slip stool.  Keep the floor dry. Clean up any water that spills on the floor as soon as it happens.  Remove soap buildup in the tub or shower regularly.  Attach bath mats securely with double-sided  non-slip rug tape.  Do not have throw rugs and other things on the floor that can make you trip. What can I do in the bedroom?  Use night lights.  Make sure that you have a light by your bed that is easy to reach.  Do not use any sheets or blankets that are too big for your bed. They should not hang down onto the floor.  Have a firm chair that has side arms. You can use this for support while you get dressed.  Do not have throw rugs and other things on the floor that can make you trip. What can I do in the kitchen?  Clean up any spills right away.  Avoid walking on wet floors.  Keep items that you use a lot in easy-to-reach places.  If you need to reach something above you, use a strong step stool that has a grab bar.  Keep electrical cords out of the way.  Do not use floor polish or wax that makes floors slippery. If you must use wax, use non-skid floor wax.  Do not have throw rugs and other things on the floor that can make you trip. What can I do with my  stairs?  Do not leave any items on the stairs.  Make sure that there are handrails on both sides of the stairs and use them. Fix handrails that are broken or loose. Make sure that handrails are as long as the stairways.  Check any carpeting to make sure that it is firmly attached to the stairs. Fix any carpet that is loose or worn.  Avoid having throw rugs at the top or bottom of the stairs. If you do have throw rugs, attach them to the floor with carpet tape.  Make sure that you have a light switch at the top of the stairs and the bottom of the stairs. If you do not have them, ask someone to add them for you. What else can I do to help prevent falls?  Wear shoes that:  Do not have high heels.  Have rubber bottoms.  Are comfortable and fit you well.  Are closed at the toe. Do not wear sandals.  If you use a stepladder:  Make sure that it is fully opened. Do not climb a closed stepladder.  Make sure that both  sides of the stepladder are locked into place.  Ask someone to hold it for you, if possible.  Clearly mark and make sure that you can see:  Any grab bars or handrails.  First and last steps.  Where the edge of each step is.  Use tools that help you move around (mobility aids) if they are needed. These include:  Canes.  Walkers.  Scooters.  Crutches.  Turn on the lights when you go into a dark area. Replace any light bulbs as soon as they burn out.  Set up your furniture so you have a clear path. Avoid moving your furniture around.  If any of your floors are uneven, fix them.  If there are any pets around you, be aware of where they are.  Review your medicines with your doctor. Some medicines can make you feel dizzy. This can increase your chance of falling. Ask your doctor what other things that you can do to help prevent falls. This information is not intended to replace advice given to you by your health care provider. Make sure you discuss any questions you have with your health care provider. Document Released: 04/30/2009 Document Revised: 12/10/2015 Document Reviewed: 08/08/2014 Elsevier Interactive Patient Education  2017 [employer].
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
## 2018-10-02 ENCOUNTER — Other Ambulatory Visit: Payer: Self-pay | Admitting: Nurse Practitioner

## 2018-10-02 DIAGNOSIS — K2100 Gastro-esophageal reflux disease with esophagitis, without bleeding: Secondary | ICD-10-CM

## 2018-10-02 MED ORDER — FAMOTIDINE 20 MG PO TABS: 20.0000 mg | ORAL_TABLET | Freq: Every day | ORAL | 1 refills | Status: DC | PRN

## 2018-10-19 ENCOUNTER — Other Ambulatory Visit: Payer: Self-pay | Admitting: Nurse Practitioner

## 2018-10-19 DIAGNOSIS — K2100 Gastro-esophageal reflux disease with esophagitis, without bleeding: Secondary | ICD-10-CM

## 2018-10-19 DIAGNOSIS — I700 Atherosclerosis of aorta: Secondary | ICD-10-CM

## 2018-10-19 DIAGNOSIS — E782 Mixed hyperlipidemia: Secondary | ICD-10-CM

## 2018-10-19 DIAGNOSIS — E119 Type 2 diabetes mellitus without complications: Secondary | ICD-10-CM

## 2018-10-19 DIAGNOSIS — I10 Essential (primary) hypertension: Secondary | ICD-10-CM

## 2018-10-23 ENCOUNTER — Other Ambulatory Visit: Payer: Self-pay | Admitting: Nurse Practitioner

## 2018-10-23 ENCOUNTER — Other Ambulatory Visit: Payer: Self-pay

## 2018-10-23 ENCOUNTER — Ambulatory Visit (INDEPENDENT_AMBULATORY_CARE_PROVIDER_SITE_OTHER): Payer: Medicare Other | Admitting: Nurse Practitioner

## 2018-10-23 ENCOUNTER — Encounter: Payer: Self-pay | Admitting: Nurse Practitioner

## 2018-10-23 VITALS — BP 127/77 | HR 95

## 2018-10-23 DIAGNOSIS — K2100 Gastro-esophageal reflux disease with esophagitis, without bleeding: Secondary | ICD-10-CM

## 2018-10-23 DIAGNOSIS — I10 Essential (primary) hypertension: Secondary | ICD-10-CM | POA: Diagnosis not present

## 2018-10-23 DIAGNOSIS — I700 Atherosclerosis of aorta: Secondary | ICD-10-CM | POA: Diagnosis not present

## 2018-10-23 DIAGNOSIS — D860 Sarcoidosis of lung: Secondary | ICD-10-CM

## 2018-10-23 DIAGNOSIS — T466X5A Adverse effect of antihyperlipidemic and antiarteriosclerotic drugs, initial encounter: Secondary | ICD-10-CM

## 2018-10-23 DIAGNOSIS — E119 Type 2 diabetes mellitus without complications: Secondary | ICD-10-CM

## 2018-10-23 DIAGNOSIS — K219 Gastro-esophageal reflux disease without esophagitis: Secondary | ICD-10-CM

## 2018-10-23 DIAGNOSIS — E782 Mixed hyperlipidemia: Secondary | ICD-10-CM | POA: Diagnosis not present

## 2018-10-23 DIAGNOSIS — M791 Myalgia, unspecified site: Secondary | ICD-10-CM

## 2018-10-23 MED ORDER — PULSE OXIMETER MISC: 1.0000 | Freq: Every day | 0 refills | Status: AC

## 2018-10-23 MED ORDER — EVOLOCUMAB 140 MG/ML ~~LOC~~ SOSY: 140.0000 mg | PREFILLED_SYRINGE | SUBCUTANEOUS | 11 refills | Status: DC

## 2018-10-23 MED ORDER — FISH OIL 1000 MG PO CAPS: 3000.0000 mg | ORAL_CAPSULE | Freq: Every day | ORAL | 0 refills | Status: DC

## 2018-10-23 NOTE — Progress Notes (Signed)
Pt checking blood pressure on regular her average been 130/138-80/84. We would like a prescription for a Pulse oximeter.

## 2018-10-23 NOTE — Patient Instructions (Addendum)
Mary Burke,   Thank you for coming in to clinic today.  1. You will be due for FASTING BLOOD WORK.  This means you should eat no food or drink after midnight.  Drink only water or coffee without cream/sugar on the morning of your lab visit. - Please go ahead and schedule a "Lab Only" visit in the morning at the clinic for lab draw in the next 7 days. - Your results will be available about 2-3 days after blood draw.  If you have set up a MyChart account, you can can log in to MyChart online to view your results and a brief explanation. Also, we can discuss your results together at your next office visit if you would like.    2. Continue great lifestyle changes for managing your diabetes, cholesterol, and blood pressure.   3. For your sarcoidosis, we have ordered a replacement pulse oximeter.  Hopefully, your insurance will cover this for you at [facility].   4. For triglyceride management, increasing fish oil is proven to help reduce your triglycerides. - For LDL reduction to goal < 70, we are going to consider starting every 14 days injectable Repatha.  I have included a drug insert information at the end of this summary.  Please let me know if cost is too high or if you have decided not to take this.  I understand that this is a decision you will want to make independently based on risks of heart attack and stroke and the balance of a healthy lifestyle.  I have made this recommendation due to our published guidelines for best practice to prevent heart attack and stroke in patients who have diabetes.  Again, every guideline needs to be discussed with patients and individual decisions made for what is best for you.   Please schedule a follow-up appointment with Cassell Smiles, AGNP. Return in about 3 months (around 01/22/2019) for diabetes, cholesterol.   If you have any other questions or concerns, please feel free to call the clinic or send a message through [place]. You may also schedule  an earlier appointment if necessary.  You will receive a survey after today's visit either digitally by e-mail or paper by [employer]. Your experiences and feedback matter to [place].  Please respond so we know how we are doing as we provide care for you.   Cassell Smiles, DNP, AGNP-BC Adult Gerontology Nurse Practitioner [facility], [facility]    Evolocumab injection What is this medicine? EVOLOCUMAB (e voe LOK ue mab) is known as a PCSK9 inhibitor. It is used to lower the level of cholesterol in the blood. It may be used alone or in combination with other cholesterol-lowering drugs. This drug may also be used to reduce the risk of heart attack, stroke, and certain types of heart surgery in patients with heart disease. This medicine may be used for other purposes; ask your health care provider or pharmacist if you have questions. COMMON BRAND NAME(S): Repatha What should I tell my health care provider before I take this medicine? They need to know if you have any of these conditions: -an unusual or allergic reaction to evolocumab, other medicines, foods, dyes, or preservatives -pregnant or trying to get pregnant -breast-feeding How should I use this medicine? This medicine is for injection under the skin. You will be taught how to prepare and give this medicine. Use exactly as directed. Take your medicine at regular intervals. Do not take your medicine more often than directed.  It is important that you put your used needles and syringes in a special sharps container. Do not put them in a trash can. If you do not have a sharps container, call your pharmacist or health care provider to get one. Talk to your pediatrician regarding the use of this medicine in children. While this drug may be prescribed for children as young as 13 years for selected conditions, precautions do apply. Overdosage: If you think you have taken too much of this medicine contact a poison control center or  emergency room at once. NOTE: This medicine is only for you. Do not share this medicine with others. What if I miss a dose? If you miss a dose, take it as soon as you can if there are more than 7 days until the next scheduled dose, or skip the missed dose and take the next dose according to your original schedule. Do not take double or extra doses. What may interact with this medicine? Interactions are not expected. This list may not describe all possible interactions. Give your health care provider a list of all the medicines, herbs, non-prescription drugs, or dietary supplements you use. Also tell them if you smoke, drink alcohol, or use illegal drugs. Some items may interact with your medicine. What should I watch for while using this medicine? You may need blood work while you are taking this medicine. What side effects may I notice from receiving this medicine? Side effects that you should report to your doctor or health care professional as soon as possible: -allergic reactions like skin rash, itching or hives, swelling of the face, lips, or tongue -signs and symptoms of high blood sugar such as dizziness; dry mouth; dry skin; fruity breath; nausea; stomach pain; increased hunger or thirst; increased urination -signs and symptoms of infection like fever or chills; cough; sore throat; pain or trouble passing urine Side effects that usually do not require medical attention (report to your doctor or health care professional if they continue or are bothersome): -diarrhea -nausea -muscle pain -pain, redness, or irritation at site where injected This list may not describe all possible side effects. Call your doctor for medical advice about side effects. You may report side effects to FDA at 1-800-FDA-1088. Where should I keep my medicine? Keep out of the reach of children. You will be instructed on how to store this medicine. Throw away any unused medicine after the expiration date on the  label. NOTE: This sheet is a summary. It may not cover all possible information. If you have questions about this medicine, talk to your doctor, pharmacist, or health care provider.  2019 Elsevier/Gold Standard (2017-02-13 13:31:00)

## 2018-10-23 NOTE — Progress Notes (Signed)
Telemedicine Encounter: Disclosed to patient at start of encounter that we will provide appropriate telemedicine services.  Patient consents to be treated via video visit prior to discussion. - Patient is at her home and is accessed via [email or web address] web platform. - Services are provided by Cassell Smiles from [facility].  Subjective:    Patient ID: Mary Burke, adult    DOB: 1945-09-26, 73 y.o.   MRN: [id number]  Mary Burke is a 73 y.o. adult presenting on 10/23/2018 for Diabetes (pt stopped Zetia, because it was causing her muscle cramps. )   HPI Hyperlipidemia Patient stopped ezetimibe due to muscle cramping.  Patient was taking this as 10 mg dose every Monday, Wednesday, Friday.   Continued only for a couple of weeks before stopping.  Is taking fish oil in lifelong vitality pack with doterra - 3,000 mg daily. - Pt denies changes in vision, chest tightness/pressure, palpitations, shortness of breath, leg pain while walking, leg or arm weakness, and sudden loss of speech or loss of consciousness.  Diabetes Pt presents today for follow up of Type 2 diabetes mellitus.  She is checking fasting am CBG at home with a range of 105-130, usually 115-125 - Current diabetic medications include: none - lifestyle only management - She is not currently symptomatic.  - She denies polydipsia, polyphagia, polyuria, headaches, diaphoresis, shakiness, chills, pain, numbness or tingling in extremities and changes in vision.   - Clinical course has been unchanged. - She  reports an exercise routine that includes walking, clogging, 3-5 days per week. - Her diet is moderate in salt, moderate in fat, and low in carbohydrates.  Has gone through diet and has changed to gluten free diet to help reduce inflammation/arthritis pain.  Avoids sweets.  Uses gluten free protein bar and fruits for sweet treats. - Weight trend: stable per patient report  PREVENTION: Eye exam current (within  one year): yes Foot exam current (within one year): yes  Lipid/ASCVD risk reduction - on statin: statin intolerant, ezetimibe intolerant due to myalgias.  Is not certain she wants to use anti-PCSK9 med.  Cost and possible side effects would be barrier. Kidney protection - on ace or arb: yes Recent Labs    04/23/18 0841  HGBA1C 6.1*   Hypertension 128/79 today.  Usually 120-138/75-85.  Patient notes no changes to regular control of BP.  Continues to take losartan without side effects.   - Pt denies headache, lightheadedness, dizziness, changes in vision, chest tightness/pressure, palpitations, leg swelling, sudden loss of speech or loss of consciousness.   Sarcoidosis Husbands pulse oximeter - has finally died.  She is requesting order for new pulse ox.  She regularly checks if she is ever short of breath.  Largely, patient has no concerns with sarcoid, but does occasionally become short of breath if outdoors in heat or cold for long periods or when pollen is higher.  Social History   Tobacco Use  . Smoking status: Never Smoker  . Smokeless tobacco: Never Used  Substance Use Topics  . Alcohol use: No  . Drug use: No    Review of Systems Per HPI unless specifically indicated above     Objective:    There were no vitals taken for this visit.  Wt Readings from Last 3 Encounters:  08/21/18 170 lb 12.8 oz (77.5 kg)  04/23/18 175 lb 3.2 oz (79.5 kg)  10/18/17 173 lb 6.4 oz (78.7 kg)    Physical Exam Patient remotely monitored.  Video  appearance shows she is well groomed, dressed appropriately with normal appearance.  Verbal communication appropriate.  Cognition normal. Mood and affect normal.  Results for orders placed or performed in visit on 04/23/18  Lipid panel  Result Value Ref Range   Cholesterol 207 (H) <200 mg/dL   HDL 56 >50 mg/dL   Triglycerides 159 (H) <150 mg/dL   LDL Cholesterol (Calc) 123 (H) mg/dL (calc)   Total CHOL/HDL Ratio 3.7 <5.0 (calc)   Non-HDL  Cholesterol (Calc) 151 (H) <130 mg/dL (calc)  TSH  Result Value Ref Range   TSH 1.55 0.40 - 4.50 mIU/L  COMPLETE METABOLIC PANEL WITH GFR  Result Value Ref Range   Glucose, Bld 90 65 - 99 mg/dL   BUN 13 7 - 25 mg/dL   Creat 0.59 0.50 - 0.99 mg/dL   GFR, Est Non African American 94 > OR = 60 mL/min/1.55m2   GFR, Est African American 108 > OR = 60 mL/min/1.93m2   BUN/Creatinine Ratio NOT APPLICABLE 6 - 22 (calc)   Sodium 140 135 - 146 mmol/L   Potassium 4.5 3.5 - 5.3 mmol/L   Chloride 104 98 - 110 mmol/L   CO2 31 20 - 32 mmol/L   Calcium 9.3 8.6 - 10.4 mg/dL   Total Protein 6.4 6.1 - 8.1 g/dL   Albumin 4.0 3.6 - 5.1 g/dL   Globulin 2.4 1.9 - 3.7 g/dL (calc)   AG Ratio 1.7 1.0 - 2.5 (calc)   Total Bilirubin 0.3 0.2 - 1.2 mg/dL   Alkaline phosphatase (APISO) 48 33 - 130 U/L   AST 18 10 - 35 U/L   ALT 12 6 - 29 U/L  CBC with Differential/Platelet  Result Value Ref Range   WBC 6.2 3.8 - 10.8 Thousand/uL   RBC 4.50 3.80 - 5.10 Million/uL   Hemoglobin 12.9 11.7 - 15.5 g/dL   HCT 38.8 35.0 - 45.0 %   MCV 86.2 80.0 - 100.0 fL   MCH 28.7 27.0 - 33.0 pg   MCHC 33.2 32.0 - 36.0 g/dL   RDW 13.4 11.0 - 15.0 %   Platelets 362 140 - 400 Thousand/uL   MPV 10.4 7.5 - 12.5 fL   Neutro Abs 3,441 1,500 - 7,800 cells/uL   Lymphs Abs 1,736 850 - 3,900 cells/uL   WBC mixed population 794 200 - 950 cells/uL   Eosinophils Absolute 149 15 - 500 cells/uL   Basophils Absolute 81 0 - 200 cells/uL   Neutrophils Relative % 55.5 %   Total Lymphocyte 28.0 %   Monocytes Relative 12.8 %   Eosinophils Relative 2.4 %   Basophils Relative 1.3 %  POCT glycosylated hemoglobin (Hb A1C)  Result Value Ref Range   Hemoglobin A1C 6.1 (A) 4.0 - 5.6 %   HbA1c POC (<> result, manual entry)     HbA1c, POC (prediabetic range)     HbA1c, POC (controlled diabetic range)        Assessment & Plan:   Problem List Items Addressed This Visit      Cardiovascular and Mediastinum   Essential hypertension Controlled  hypertension.  BP goal < 130/80.  Pt is working on lifestyle modifications.  Taking medications tolerating well without side effects. No currently identified complications.  Plan: 1. Continue taking losartan 25 mg once daily 2. Obtain labs this week  3. Encouraged heart healthy diet and increasing exercise to 30 minutes most days of the week. 4. Check BP 1-2 x per week at home, keep log, and bring to  clinic at next appointment. 5. Follow up 3 months.     Relevant Medications   Evolocumab (REPATHA) 140 MG/ML SOSY   Aortic atherosclerosis (HCC) Myalgia due to statin Known ASCVD without prior events.  Patient with statin intolerance with myalgias even with once weekly dosing. Patient has also failed ezetimibe. - Consider Repatha.  Cost is patient's biggest concern.  Proceed with PA and patient can make a decision to start if ready and cost is reasonable. - Continue lifestyle management - Follow-up 3 months.   Relevant Medications   Evolocumab (REPATHA) 140 MG/ML SOSY     Respiratory   Pulmonary sarcoidosis (HCC) - Primary Stable per symptoms.  Patient needs pulse ox for regular checking of hypoxia if short of breath.  New device ordered.  Follow-up prn and with pulmonology as needed.   Relevant Medications   Misc. Devices (PULSE OXIMETER) MISC     Digestive   Gastroesophageal reflux disease Stable per patient.  No changes and no bleeding noted.  Needs refills of meds.  Refills sent.     Endocrine   Controlled type 2 diabetes mellitus without complication, without long-term current use of insulin (HCC) Previously diet controlled. Currently unknown status for A1c, but home CBG checks WNL.   - Goal A1c: <2.5% - No known complications other than mixed dyslipidemia  Plan:  1. Continue current therapy: lifestyle 2. Encourage improved lifestyle: - low carb/low glycemic diet reinforced prior education - Increase physical activity to 30 minutes most days of the week.  Explained that  increased physical activity increases body's use of sugar for energy. 3. Check fasting am CBG and bring log to next visit for review 4. Continue ARB. Considering start of Section. - START fish oil for hypertriglyceridemia. 5. Labs this week 6. Follow-up 3 months.    Relevant Medications   Omega-3 Fatty Acids (FISH OIL) 1000 MG CAPS     Other   Hyperlipidemia See ASCVD above.  Treat with Repatha if able for reasonable cost.  Fish oil start at 3,000 mg daily for hypertriglyceridemia.  Re-evaluate if this elevates LDL in future. Follow-up 3 months.   Relevant Medications   Evolocumab (REPATHA) 140 MG/ML SOSY   Omega-3 Fatty Acids (FISH OIL) 1000 MG CAPS      Meds ordered this encounter  Medications  . Evolocumab (REPATHA) 140 MG/ML SOSY    Sig: Inject 140 mg into the skin every 14 (fourteen) days.    Dispense:  2 Syringe    Refill:  11    Order Specific Question:   Supervising Provider    Answer:   Olin Hauser [[id number]]  . Misc. Devices (PULSE OXIMETER) MISC    Sig: 1 Device by Does not apply route daily.    Dispense:  1 each    Refill:  0    Order Specific Question:   Supervising Provider    Answer:   Olin Hauser [[id number]]  . Omega-3 Fatty Acids (FISH OIL) 1000 MG CAPS    Sig: Take 3 capsules (3,000 mg total) by mouth daily.    Refill:  0    Order Specific Question:   Supervising Provider    Answer:   Olin Hauser [[id number]]   - Time spent in direct consultation with patient via telemedicine about above concerns: 17 minutes  Follow up plan: Return in about 3 months (around 01/22/2019) for diabetes, cholesterol.  Cassell Smiles, DNP, AGPCNP-BC Adult Gerontology Primary Care Nurse Practitioner [facility]  10/23/2018, 8:04 AM

## 2018-10-24 ENCOUNTER — Encounter: Payer: Self-pay | Admitting: Nurse Practitioner

## 2018-10-25 LAB — CBC WITH DIFFERENTIAL/PLATELET
Absolute Monocytes: 726 cells/uL (ref 200–950)
Basophils Absolute: 60 cells/uL (ref 0–200)
Basophils Relative: 1 %
Eosinophils Absolute: 240 cells/uL (ref 15–500)
Eosinophils Relative: 4 %
HCT: 37.1 % (ref 35.0–45.0)
Hemoglobin: 12 g/dL (ref 11.7–15.5)
Lymphs Abs: 1584 cells/uL (ref 850–3900)
MCH: 27.3 pg (ref 27.0–33.0)
MCHC: 32.3 g/dL (ref 32.0–36.0)
MCV: 84.3 fL (ref 80.0–100.0)
MPV: 10.4 fL (ref 7.5–12.5)
Monocytes Relative: 12.1 %
Neutro Abs: 3390 cells/uL (ref 1500–7800)
Neutrophils Relative %: 56.5 %
Platelets: 338 10*3/uL (ref 140–400)
RBC: 4.4 10*6/uL (ref 3.80–5.10)
RDW: 13.4 % (ref 11.0–15.0)
Total Lymphocyte: 26.4 %
WBC: 6 10*3/uL (ref 3.8–10.8)

## 2018-10-25 LAB — COMPLETE METABOLIC PANEL WITH GFR
AG Ratio: 1.6 (calc) (ref 1.0–2.5)
ALT: 19 U/L (ref 6–29)
AST: 23 U/L (ref 10–35)
Albumin: 3.9 g/dL (ref 3.6–5.1)
Alkaline phosphatase (APISO): 61 U/L (ref 37–153)
BUN: 11 mg/dL (ref 7–25)
CO2: 28 mmol/L (ref 20–32)
Calcium: 9.4 mg/dL (ref 8.6–10.4)
Chloride: 107 mmol/L (ref 98–110)
Creat: 0.73 mg/dL (ref 0.60–0.93)
GFR, Est African American: 97 mL/min/{1.73_m2} (ref >=60)
GFR, Est Non African American: 83 mL/min/{1.73_m2} (ref >=60)
Globulin: 2.4 g/dL (calc) (ref 1.9–3.7)
Glucose, Bld: 92 mg/dL (ref 65–99)
Potassium: 4.4 mmol/L (ref 3.5–5.3)
Sodium: 143 mmol/L (ref 135–146)
Total Bilirubin: 0.4 mg/dL (ref 0.2–1.2)
Total Protein: 6.3 g/dL (ref 6.1–8.1)

## 2018-10-25 LAB — LIPID PANEL
Cholesterol: 203 mg/dL — ABNORMAL HIGH (ref <200)
HDL: 53 mg/dL (ref >=50)
LDL Cholesterol (Calc): 125 mg/dL (calc) — ABNORMAL HIGH
Non-HDL Cholesterol (Calc): 150 mg/dL (calc) — ABNORMAL HIGH (ref <130)
Total CHOL/HDL Ratio: 3.8 (calc) (ref <5.0)
Triglycerides: 139 mg/dL (ref <150)

## 2018-10-25 LAB — HEMOGLOBIN A1C
Hgb A1c MFr Bld: 6 % of total Hgb — ABNORMAL HIGH (ref <5.7)
Mean Plasma Glucose: 126 (calc)
eAG (mmol/L): 7 (calc)

## 2018-12-18 IMAGING — CR DG CHEST 2V
1 series · 2 of 2 positions shown · non-contrast
Comparison: 02/24/2017

CLINICAL DATA: Follow-up post biopsy pneumothorax

EXAM:
CHEST  2 VIEW

[Series 1: w chest pa · 0.14mm/px · 2 of 2 slices shown]
[im 1/2]
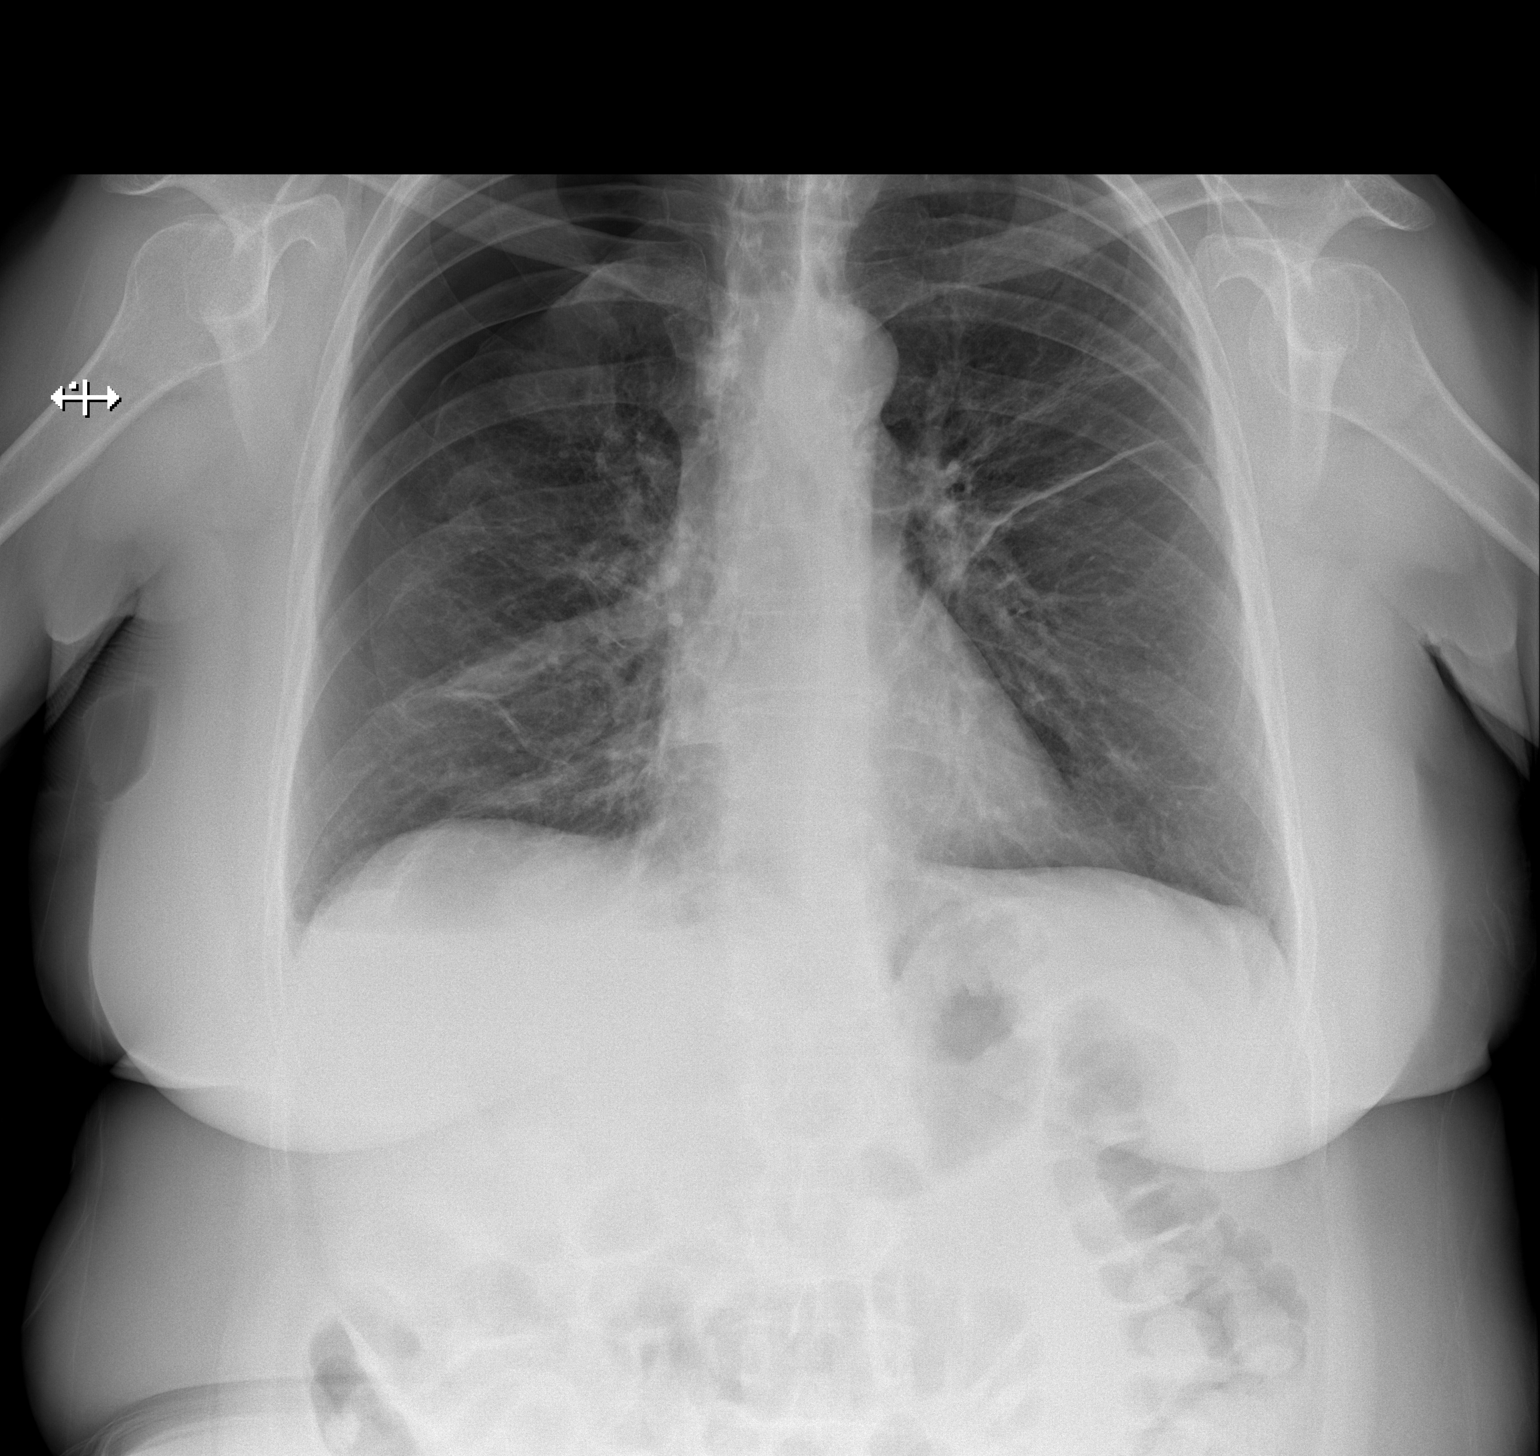
[im 2/2]
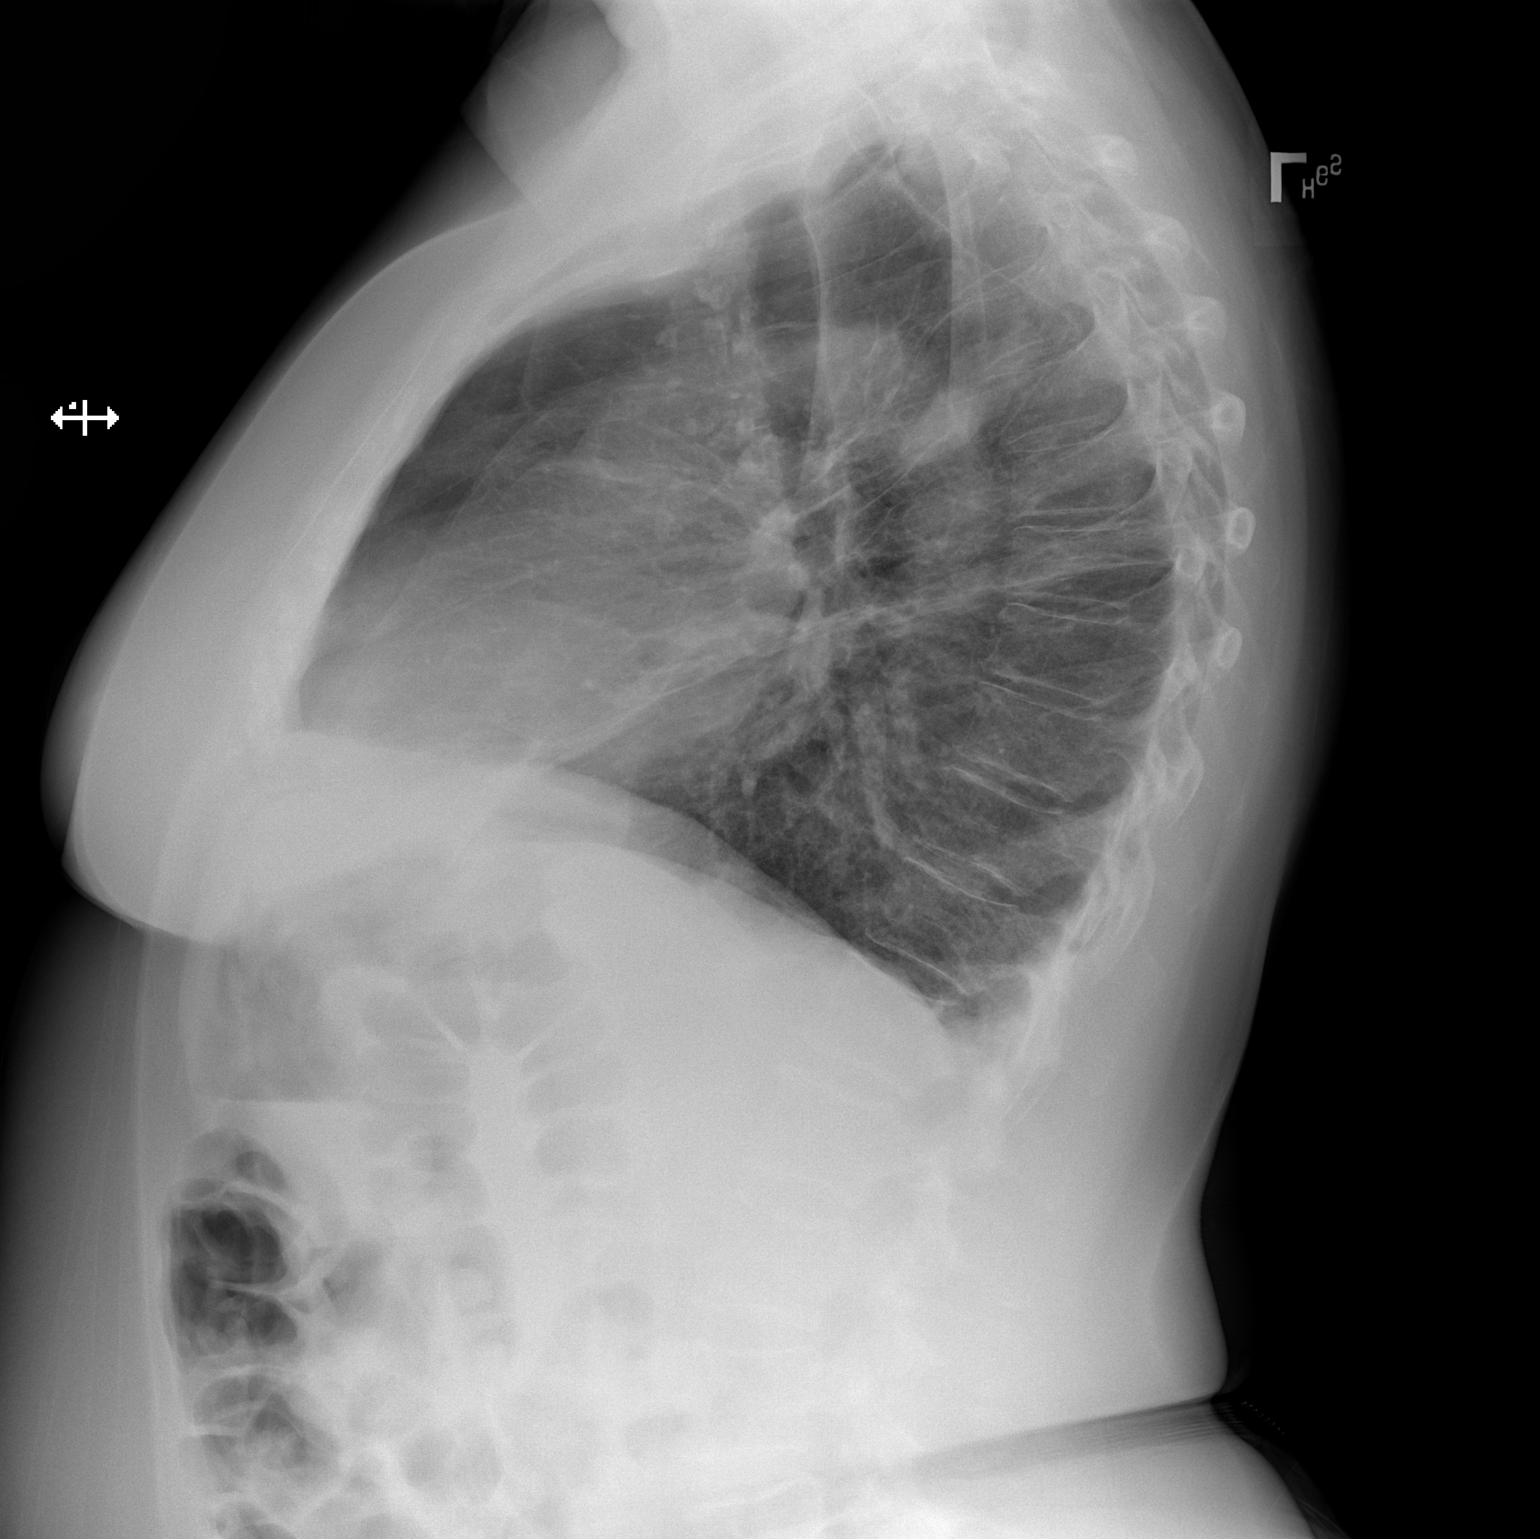

[2 of 2 positions shown; findings below may reference images not displayed]

FINDINGS: There is an increasing right-sided pneumothorax identified. Known
right upper lobe mass lesion is again seen. Some scarring is noted
in the left lung. Cardiac shadow is stable. No acute bony
abnormality is noted.
IMPRESSION: Increase in right-sided pneumothorax. These changes are already
known to the referring service and percutaneous intervention has
been performed.

## 2018-12-18 IMAGING — DX DG CHEST 1V PORT
1 series · 1 of 1 positions shown · non-contrast
Comparison: 02/22/2017 CT chest

CLINICAL DATA: Post lung biopsy

EXAM:
PORTABLE CHEST 1 VIEW

[chest ap]
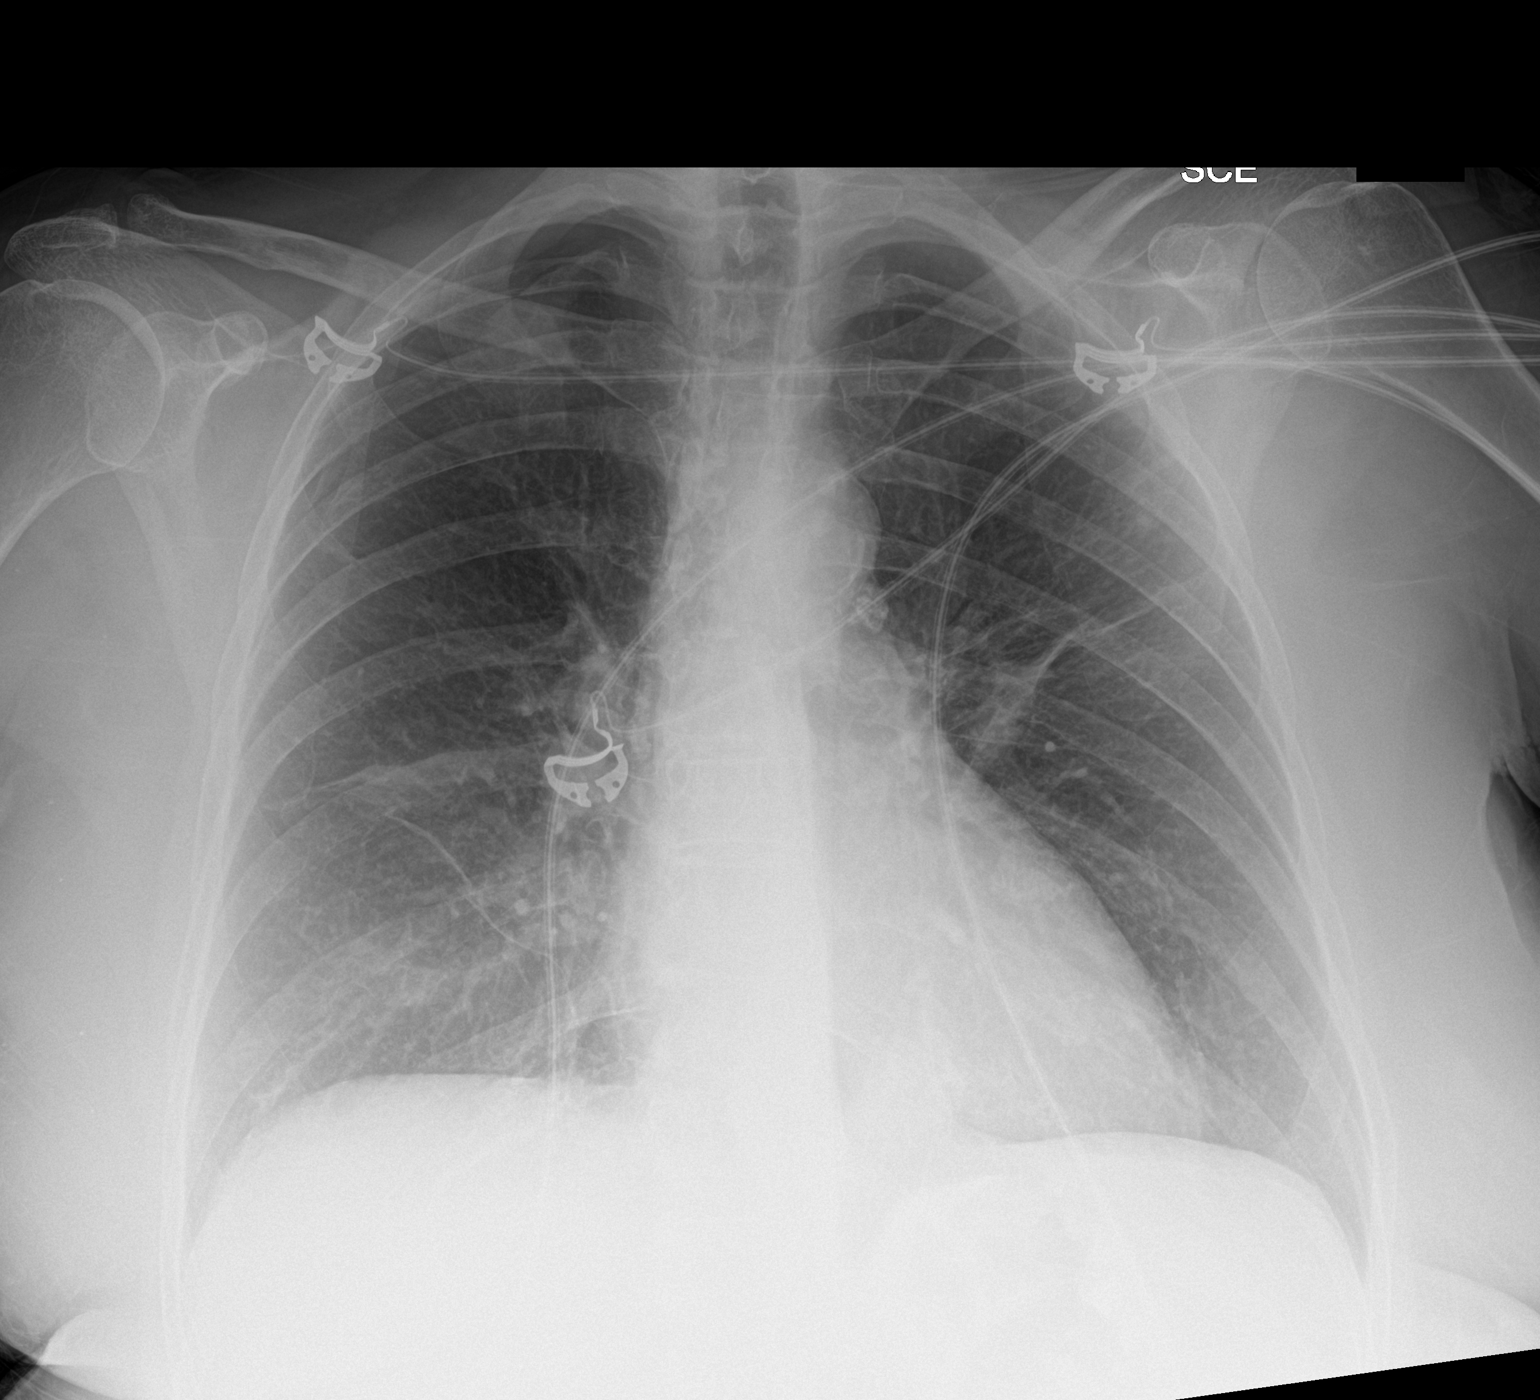

[1 of 1 positions shown; findings below may reference images not displayed]

FINDINGS: Normal heart size, mediastinal contours, and pulmonary vascularity.

Atherosclerotic calcification aorta.

Subsegmental atelectasis versus scarring in the mid lungs
bilaterally.

Small RIGHT apex pneumothorax.

Remaining lungs clear.

RIGHT apical nodule which biopsied under CT guidance is not
adequately visualized on this exam.

No pleural effusion or acute osseous findings.
IMPRESSION: Small RIGHT apex pneumothorax post lung biopsy ; a follow-up study
has already been performed, please refer to that report.

## 2018-12-21 IMAGING — DX DG CHEST 1V PORT
1 series · 1 of 1 positions shown · non-contrast
Comparison: One-view chest x-ray 02/26/2017

CLINICAL DATA: Pneumothorax.

EXAM:
PORTABLE CHEST 1 VIEW

[chest ap]
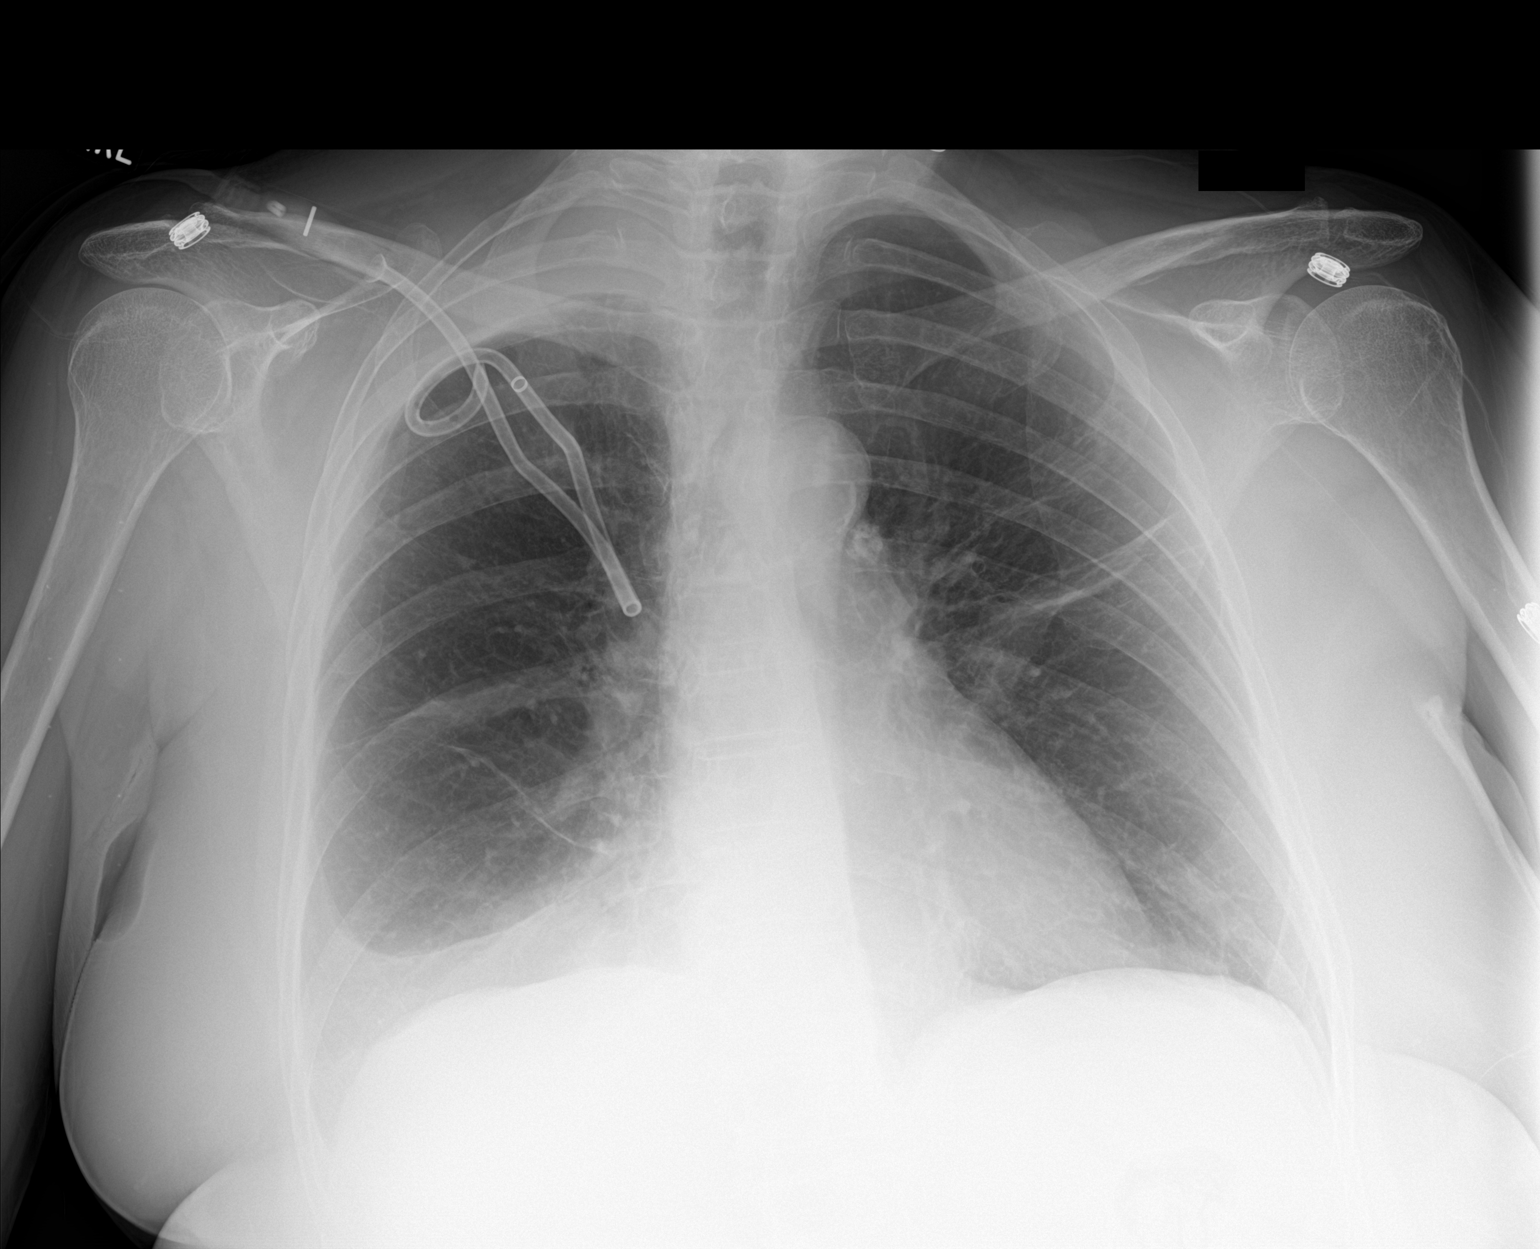

[1 of 1 positions shown; findings below may reference images not displayed]

FINDINGS: A right-sided chest tube remains in place. Right pleural effusion is
unchanged. There is no significant pleural air. Areas of linear
atelectasis are similar the prior exam.
IMPRESSION: 1. Stable position of right-sided chest tube.
2. Right pleural effusion unchanged without significant
pneumothorax.
3. Stable areas of linear atelectasis.

## 2018-12-24 IMAGING — CR DG CHEST 2V
1 series · 2 of 2 positions shown · non-contrast
Comparison: Radiographs February 27, 2017.

CLINICAL DATA: Pneumothorax after biopsy.

EXAM:
CHEST  2 VIEW

[Series 3: w chest pa · 0.14mm/px · 2 of 2 slices shown]
[im 1/2]
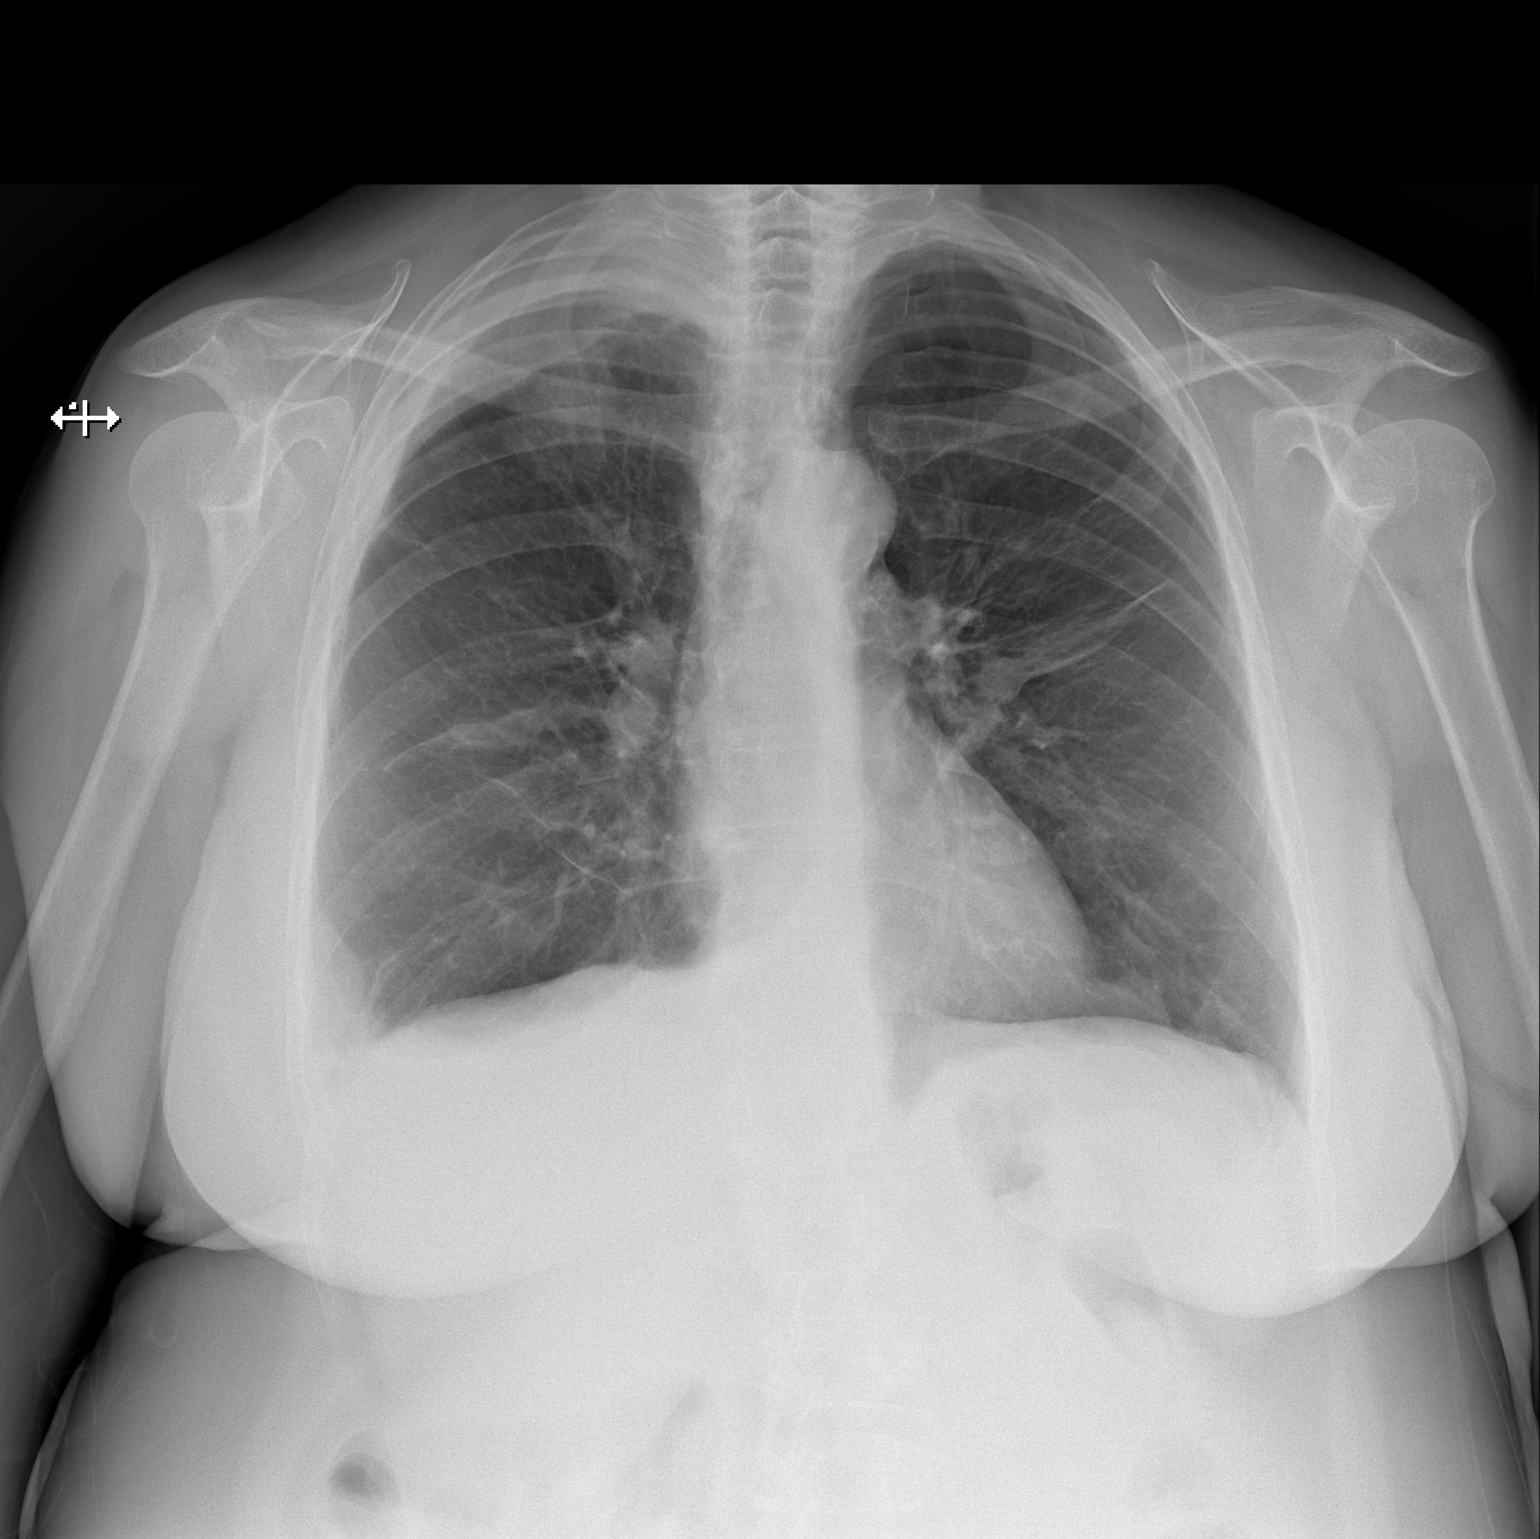
[im 2/2]
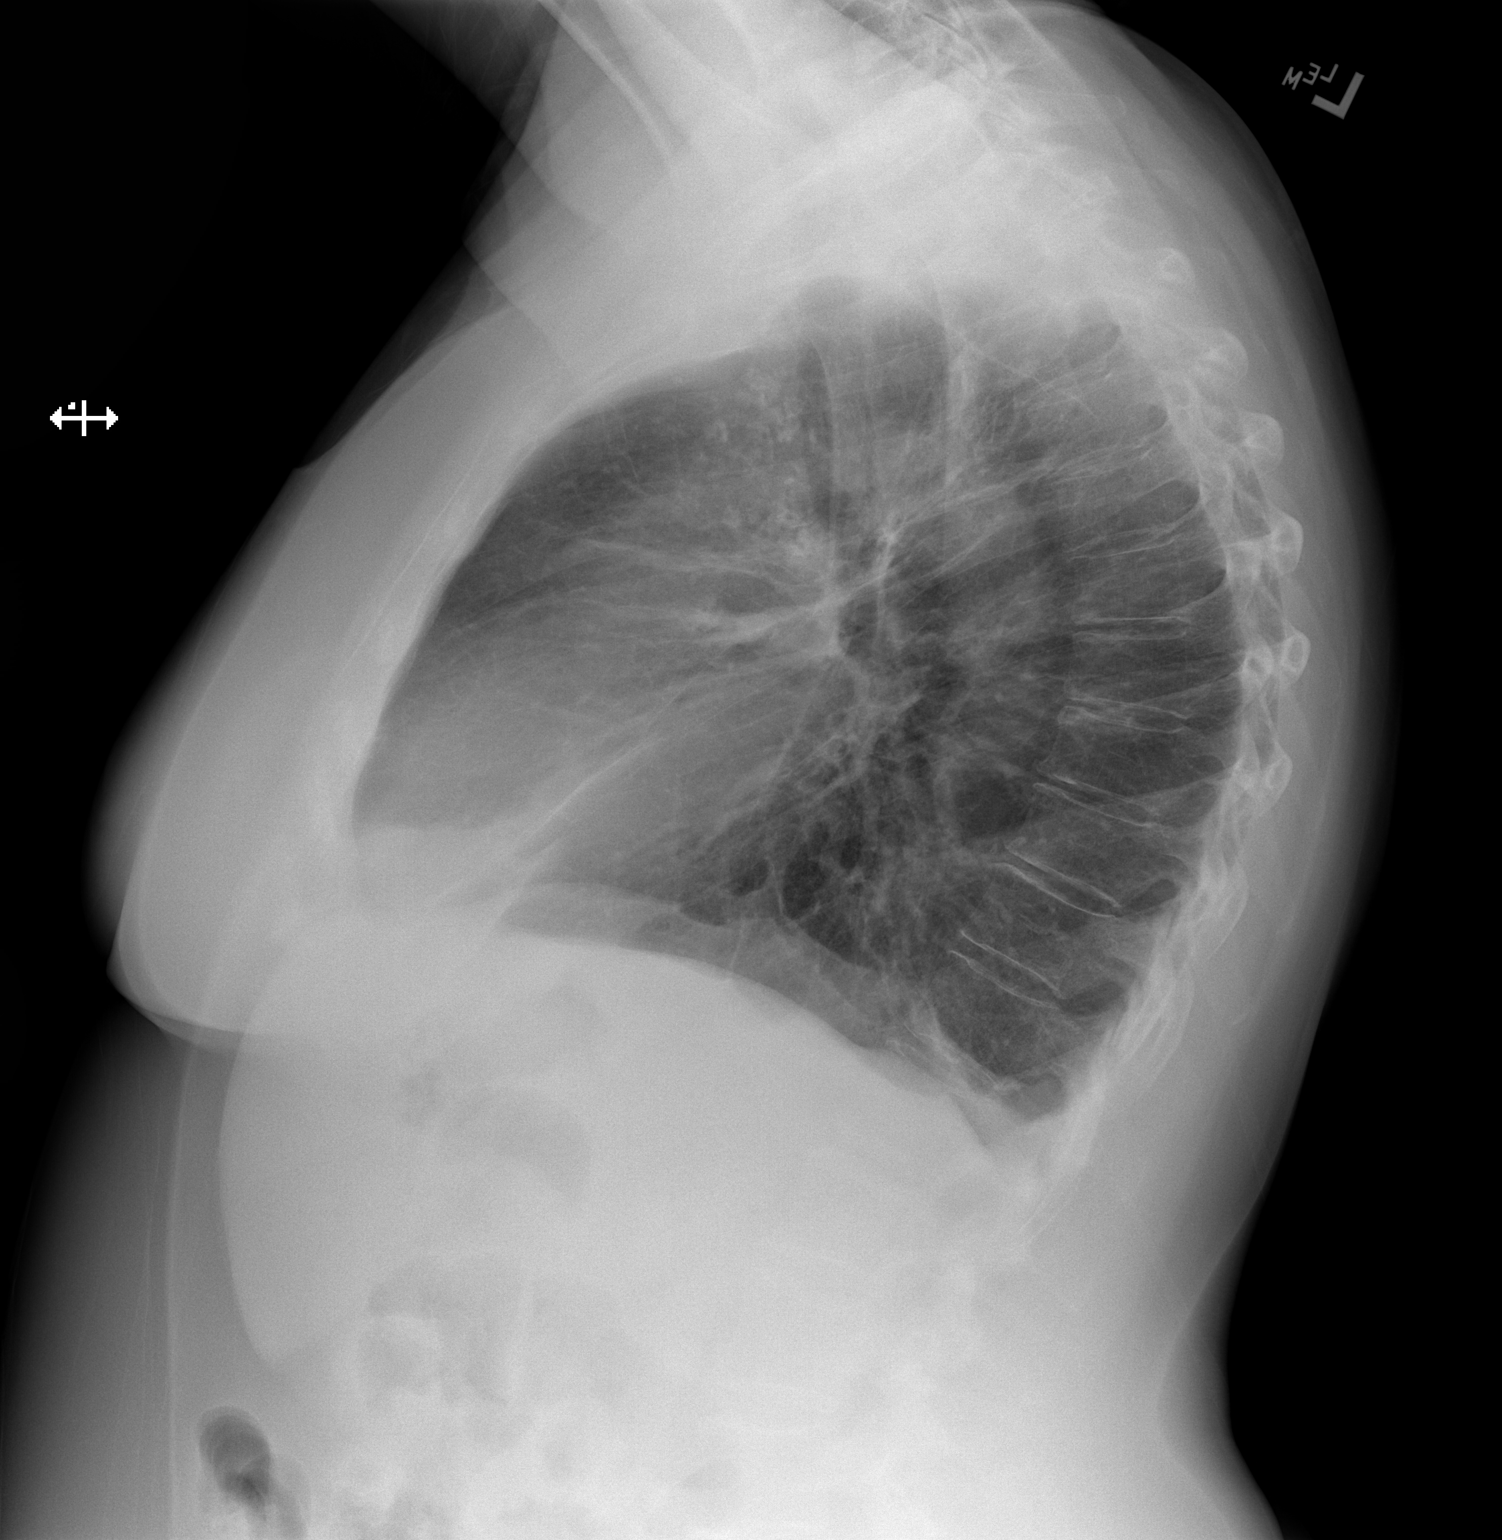

[2 of 2 positions shown; findings below may reference images not displayed]

FINDINGS: The heart size and mediastinal contours are within normal limits.
Atherosclerosis of thoracic aorta is noted. Right-sided chest tube
has been removed. Stable bilateral midlung subsegmental atelectasis
or scarring is noted. Stable right apical loculated pleural effusion
is noted. No definite pneumothorax is noted. The visualized skeletal
structures are unremarkable.
IMPRESSION: Aortic atherosclerosis. No definite pneumothorax seen status post
right-sided chest tube removal. Stable bilateral midlung
subsegmental atelectasis or scarring.

## 2018-12-27 ENCOUNTER — Other Ambulatory Visit: Payer: Self-pay | Admitting: Nurse Practitioner

## 2018-12-27 DIAGNOSIS — E119 Type 2 diabetes mellitus without complications: Secondary | ICD-10-CM

## 2018-12-27 MED ORDER — GLUCOSE BLOOD VI STRP: ORAL_STRIP | 12 refills | Status: DC

## 2018-12-27 NOTE — Telephone Encounter (Signed)
Pt called requesting refill on one touch ultra  Blue Test strip called into  [email or web address]  Is requesting only 1 box at a time be called in not  3

## 2018-12-28 ENCOUNTER — Telehealth: Payer: Self-pay | Admitting: Nurse Practitioner

## 2018-12-28 NOTE — Telephone Encounter (Signed)
[facility] called needing clarification on  Medication  [phone]

## 2019-01-05 ENCOUNTER — Other Ambulatory Visit: Payer: Self-pay | Admitting: Nurse Practitioner

## 2019-01-05 DIAGNOSIS — I10 Essential (primary) hypertension: Secondary | ICD-10-CM

## 2019-03-04 ENCOUNTER — Other Ambulatory Visit: Payer: Self-pay

## 2019-03-04 DIAGNOSIS — Z20822 Contact with and (suspected) exposure to covid-19: Secondary | ICD-10-CM

## 2019-03-05 ENCOUNTER — Telehealth: Payer: Self-pay | Admitting: Nurse Practitioner

## 2019-03-05 LAB — NOVEL CORONAVIRUS, NAA: SARS-CoV-2, NAA: NOT DETECTED

## 2019-03-05 NOTE — Telephone Encounter (Signed)
Negative COVID results given. Patient results "NOT Detected." Caller expressed understanding. ° °

## 2019-04-01 ENCOUNTER — Telehealth: Payer: Self-pay | Admitting: Nurse Practitioner

## 2019-04-01 DIAGNOSIS — E782 Mixed hyperlipidemia: Secondary | ICD-10-CM

## 2019-04-01 NOTE — Telephone Encounter (Signed)
Patient needs cholesterol followup visit to continue Repatha with repeat fasting labs.  Labs are ordered.  Patient should schedule appt.

## 2019-04-02 NOTE — Telephone Encounter (Signed)
The pt was notified. She said she will give [place] a call back and schedule the appt.

## 2019-04-04 ENCOUNTER — Telehealth: Payer: Self-pay

## 2019-04-04 NOTE — Telephone Encounter (Signed)
The pt have a lab appt scheduled for tomorrow prior to her medicare wellness. She needs labs placed.

## 2019-04-04 NOTE — Telephone Encounter (Signed)
CMP and Lipid were ordered 9/14.  No additional labs needed.  Requisitions reprinted.

## 2019-04-05 ENCOUNTER — Other Ambulatory Visit: Payer: Medicare Other

## 2019-04-06 LAB — COMPREHENSIVE METABOLIC PANEL
AG Ratio: 1.8 (calc) (ref 1.0–2.5)
ALT: 13 U/L (ref 6–29)
AST: 18 U/L (ref 10–35)
Albumin: 4.4 g/dL (ref 3.6–5.1)
Alkaline phosphatase (APISO): 52 U/L (ref 37–153)
BUN: 15 mg/dL (ref 7–25)
CO2: 25 mmol/L (ref 20–32)
Calcium: 9.7 mg/dL (ref 8.6–10.4)
Chloride: 107 mmol/L (ref 98–110)
Creat: 0.66 mg/dL (ref 0.60–0.93)
Globulin: 2.5 g/dL (calc) (ref 1.9–3.7)
Glucose, Bld: 101 mg/dL — ABNORMAL HIGH (ref 65–99)
Potassium: 4.4 mmol/L (ref 3.5–5.3)
Sodium: 142 mmol/L (ref 135–146)
Total Bilirubin: 0.4 mg/dL (ref 0.2–1.2)
Total Protein: 6.9 g/dL (ref 6.1–8.1)

## 2019-04-06 LAB — LIPID PANEL
Cholesterol: 202 mg/dL — ABNORMAL HIGH (ref <200)
HDL: 55 mg/dL (ref >=50)
LDL Cholesterol (Calc): 110 mg/dL (calc) — ABNORMAL HIGH
Non-HDL Cholesterol (Calc): 147 mg/dL (calc) — ABNORMAL HIGH (ref <130)
Total CHOL/HDL Ratio: 3.7 (calc) (ref <5.0)
Triglycerides: 246 mg/dL — ABNORMAL HIGH (ref <150)

## 2019-04-08 ENCOUNTER — Encounter: Payer: Self-pay | Admitting: Nurse Practitioner

## 2019-04-08 ENCOUNTER — Other Ambulatory Visit: Payer: Self-pay | Admitting: Nurse Practitioner

## 2019-04-17 ENCOUNTER — Other Ambulatory Visit: Payer: Self-pay | Admitting: Nurse Practitioner

## 2019-04-17 DIAGNOSIS — K219 Gastro-esophageal reflux disease without esophagitis: Secondary | ICD-10-CM

## 2019-05-07 ENCOUNTER — Ambulatory Visit (INDEPENDENT_AMBULATORY_CARE_PROVIDER_SITE_OTHER): Payer: Medicare Other | Admitting: Nurse Practitioner

## 2019-05-07 ENCOUNTER — Other Ambulatory Visit: Payer: Self-pay

## 2019-05-07 ENCOUNTER — Encounter: Payer: Self-pay | Admitting: Nurse Practitioner

## 2019-05-07 VITALS — BP 137/67 | HR 85 | Ht 61.0 in | Wt 177.8 lb

## 2019-05-07 DIAGNOSIS — E782 Mixed hyperlipidemia: Secondary | ICD-10-CM

## 2019-05-07 DIAGNOSIS — M791 Myalgia, unspecified site: Secondary | ICD-10-CM

## 2019-05-07 DIAGNOSIS — E119 Type 2 diabetes mellitus without complications: Secondary | ICD-10-CM | POA: Diagnosis not present

## 2019-05-07 DIAGNOSIS — D860 Sarcoidosis of lung: Secondary | ICD-10-CM

## 2019-05-07 DIAGNOSIS — T466X5A Adverse effect of antihyperlipidemic and antiarteriosclerotic drugs, initial encounter: Secondary | ICD-10-CM

## 2019-05-07 DIAGNOSIS — I10 Essential (primary) hypertension: Secondary | ICD-10-CM

## 2019-05-07 DIAGNOSIS — I700 Atherosclerosis of aorta: Secondary | ICD-10-CM

## 2019-05-07 LAB — POCT GLYCOSYLATED HEMOGLOBIN (HGB A1C): Hemoglobin A1C: 6.1 % — AB (ref 4.0–5.6)

## 2019-05-07 MED ORDER — LOSARTAN POTASSIUM 25 MG PO TABS: 25.0000 mg | ORAL_TABLET | Freq: Every day | ORAL | 1 refills | Status: DC

## 2019-05-07 NOTE — Patient Instructions (Addendum)
Mary Burke,   Thank you for coming in to clinic today.  1. Continue good lifestyle measures for controlling sugar and hyperlipidemia.  Please schedule a follow-up appointment. Return in about 6 months (around 11/05/2019) for diabetes, cholesterol, Sarcoidosis.  If you have any other questions or concerns, please feel free to call the clinic or send a message through [place]. You may also schedule an earlier appointment if necessary.  You will receive a survey after today's visit either digitally by e-mail or paper by [employer]. Your experiences and feedback matter to [place].  Please respond so we know how we are doing as we provide care for you.  Cassell Smiles, DNP, AGNP-BC Adult Gerontology Nurse Practitioner [place]

## 2019-05-07 NOTE — Progress Notes (Signed)
Subjective:    Patient ID: Mary Burke, female    DOB: 1946-03-25, 73 y.o.   MRN: [id number]  Mary Burke is a 73 y.o. female presenting on 05/07/2019 for Diabetes  HPI Diabetes Pt presents today for follow up of Type 2 diabetes mellitus. She is not checking CBG at home consistently.  Generally same, 120-125 or so fasting.  Checks a few times per month - Current diabetic medications include: lifestyle controlled - She is not currently symptomatic.  - She denies polydipsia, polyphagia, polyuria, headaches, diaphoresis, shakiness, chills, pain, numbness or tingling in extremities and changes in vision.   - Clinical course has been stable. - She reports no regular exercise routine. - Her diet is moderate in salt, moderate in fat, and moderate in carbohydrates.  Doing better with her granddaughter.   - Weight trend: stable  PREVENTION: Eye exam current (within one year): no Foot exam current (within one year): no  Lipid/ASCVD risk reduction - on statin: not tolerated Kidney protection - on ace or arb: losartan  Recent Labs    10/24/18 0906  HGBA1C 6.0*   Dyslipidemia Crackers are challenging to eliminate.  Repatha not helpful with cholesterol, so was stopped.  Patient is eating gluten free products, sometimes has more rice.  Patient has had complicated course of management to goal LDL<70 due to myalgias with statins. - Diet sodas are now being cut back. - Also usually makes egg white omelette, tuna salad with egg white instead of whole eggs   Pulmonary Sarcoid Patient wasn't walking much, not walking in mall r/t people not wearing masks.  Stopped. Trying to get back into walking routine at home. - Patient is avoiding crowds.  Seasonal allergies Montelukast, Flonase Currently controlled symptoms to this point.   Social History   Tobacco Use  . Smoking status: Never Smoker  . Smokeless tobacco: Never Used  Substance Use Topics  . Alcohol use: No  . Drug  use: No   Review of Systems Per HPI unless specifically indicated above    Objective:    BP 137/67 (BP Location: Right Arm, Patient Position: Sitting, Cuff Size: Normal)   Pulse 85   Ht 5\' 1"  (1.549 m)   Wt 177 lb 12.8 oz (80.6 kg)   BMI 33.60 kg/m   Wt Readings from Last 3 Encounters:  05/07/19 177 lb 12.8 oz (80.6 kg)  08/21/18 170 lb 12.8 oz (77.5 kg)  04/23/18 175 lb 3.2 oz (79.5 kg)    Physical Exam Vitals signs reviewed.  Constitutional:      General: She is not in acute distress.    Appearance: Normal appearance. She is well-developed. She is obese.  HENT:     Head: Normocephalic and atraumatic.     Right Ear: Tympanic membrane, ear canal and external ear normal.     Left Ear: Tympanic membrane, ear canal and external ear normal.  Eyes:     Extraocular Movements: Extraocular movements intact.     Conjunctiva/sclera: Conjunctivae normal.     Pupils: Pupils are equal, round, and reactive to light.  Neck:     Musculoskeletal: Normal range of motion and neck supple.     Vascular: No carotid bruit.  Cardiovascular:     Rate and Rhythm: Normal rate and regular rhythm.     Pulses: Normal pulses.     Heart sounds: Normal heart sounds. No murmur. No friction rub. No gallop.   Pulmonary:     Effort: Pulmonary effort is normal.  Breath sounds: Normal breath sounds.  Abdominal:     General: Bowel sounds are normal.     Palpations: Abdomen is soft.  Skin:    General: Skin is warm and dry.     Capillary Refill: Capillary refill takes less than 2 seconds.  Neurological:     General: No focal deficit present.     Mental Status: She is alert and oriented to person, place, and time. Mental status is at baseline.  Psychiatric:        Mood and Affect: Mood normal.        Behavior: Behavior normal.        Thought Content: Thought content normal.        Judgment: Judgment normal.    Diabetic Foot Exam - Simple   Simple Foot Form Diabetic Foot exam was performed with the  following findings: Yes 05/07/2019  9:19 AM  Visual Inspection No deformities, no ulcerations, no other skin breakdown bilaterally: Yes Sensation Testing Intact to touch and monofilament testing bilaterally: Yes Pulse Check Posterior Tibialis and Dorsalis pulse intact bilaterally: Yes Comments      Results for orders placed or performed in visit on 04/01/19  Lipid panel  Result Value Ref Range   Cholesterol 202 (H) <200 mg/dL   HDL 55 > OR = 50 mg/dL   Triglycerides 246 (H) <150 mg/dL   LDL Cholesterol (Calc) 110 (H) mg/dL (calc)   Total CHOL/HDL Ratio 3.7 <5.0 (calc)   Non-HDL Cholesterol (Calc) 147 (H) <130 mg/dL (calc)  Comprehensive metabolic panel  Result Value Ref Range   Glucose, Bld 101 (H) 65 - 99 mg/dL   BUN 15 7 - 25 mg/dL   Creat 0.66 0.60 - 0.93 mg/dL   BUN/Creatinine Ratio NOT APPLICABLE 6 - 22 (calc)   Sodium 142 135 - 146 mmol/L   Potassium 4.4 3.5 - 5.3 mmol/L   Chloride 107 98 - 110 mmol/L   CO2 25 20 - 32 mmol/L   Calcium 9.7 8.6 - 10.4 mg/dL   Total Protein 6.9 6.1 - 8.1 g/dL   Albumin 4.4 3.6 - 5.1 g/dL   Globulin 2.5 1.9 - 3.7 g/dL (calc)   AG Ratio 1.8 1.0 - 2.5 (calc)   Total Bilirubin 0.4 0.2 - 1.2 mg/dL   Alkaline phosphatase (APISO) 52 37 - 153 U/L   AST 18 10 - 35 U/L   ALT 13 6 - 29 U/L      Assessment & Plan:   Problem List Items Addressed This Visit      Cardiovascular and Mediastinum   Essential hypertension Controlled hypertension.  BP goal < 130/80.  Pt is working on lifestyle modifications.  Taking medications tolerating well without side effects. No current complications.  Plan: 1. Continue taking losartan 25 mg daily 2. Obtain labs prior to next visit  3. Encouraged heart healthy diet and increasing exercise to 30 minutes most days of the week. 4. Check BP 1-2 x per week at home, keep log, and bring to clinic at next appointment. 5. Follow up 6 months.     Relevant Medications   losartan (COZAAR) 25 MG tablet   Aortic  atherosclerosis (HCC) Stable without ASCVD events, only mildly abnormal lipids.  Patient without ability to take statins due to myalgia, Repatha not effective, so was stopped.    Plan: 1. Continue lifestyle management with low glycemic diet, increased exercise. 2. Repeat labs 6 months. 3. Follow-up 6 months.   Relevant Medications   losartan (COZAAR) 25  MG tablet     Respiratory   Pulmonary sarcoidosis (HCC) Stable.  Discussed ongoing Covid precautions as important factor in maintaining health.  Follow-up ongoing with pulmonology.     Endocrine   Controlled type 2 diabetes mellitus without complication, without long-term current use of insulin ([place]) - Primary ControlledDM with A1c 6.1% stable from last check 6 months ago and goal A1c < 7.0%. - Complications - dyslipidemia.  Plan:  1. Continue current therapy: lifestyle 2. Encourage improved lifestyle: - low carb/low glycemic diet reinforced prior education - Increase physical activity to 30 minutes most days of the week.  Explained that increased physical activity increases body's use of sugar for energy. 3. Check fasting am CBG and bring log to next visit for review 4. Continue ASA and ARB 5. DM Foot exam done today with normal findings.   and Advised to schedule DM ophtho exam, send record. 6. Follow-up 6 months    Relevant Medications   losartan (COZAAR) 25 MG tablet   Other Relevant Orders   POCT glycosylated hemoglobin (Hb A1C)     Other   Hyperlipidemia   Relevant Medications   losartan (COZAAR) 25 MG tablet   Myalgia due to statin      Meds ordered this encounter  Medications  . losartan (COZAAR) 25 MG tablet    Sig: Take 1 tablet (25 mg total) by mouth daily.    Dispense:  90 tablet    Refill:  1    Order Specific Question:   Supervising Provider    Answer:   Olin Hauser [[id number]]   Follow up plan: Return in about 6 months (around 11/05/2019) for diabetes, cholesterol, Sarcoidosis.  Cassell Smiles,  DNP, AGPCNP-BC Adult Gerontology Primary Care Nurse Practitioner [facility] 05/07/2019, 8:49 AM

## 2019-05-14 ENCOUNTER — Ambulatory Visit: Payer: Medicare Other | Admitting: Nurse Practitioner

## 2019-06-03 LAB — HM DIABETES EYE EXAM

## 2019-06-04 ENCOUNTER — Encounter: Payer: Self-pay | Admitting: Family Medicine

## 2019-06-28 ENCOUNTER — Other Ambulatory Visit: Payer: Self-pay | Admitting: Nurse Practitioner

## 2019-06-29 ENCOUNTER — Other Ambulatory Visit: Payer: Self-pay | Admitting: Nurse Practitioner

## 2019-07-05 ENCOUNTER — Other Ambulatory Visit: Payer: Self-pay | Admitting: Family Medicine

## 2019-07-05 DIAGNOSIS — I10 Essential (primary) hypertension: Secondary | ICD-10-CM

## 2019-07-05 NOTE — Telephone Encounter (Signed)
6 months sent in in October. Should not be due.

## 2019-08-09 ENCOUNTER — Ambulatory Visit (INDEPENDENT_AMBULATORY_CARE_PROVIDER_SITE_OTHER): Payer: Medicare PPO | Admitting: Family Medicine

## 2019-08-09 ENCOUNTER — Encounter: Payer: Self-pay | Admitting: Family Medicine

## 2019-08-09 ENCOUNTER — Other Ambulatory Visit: Payer: Self-pay

## 2019-08-09 DIAGNOSIS — U071 COVID-19: Secondary | ICD-10-CM

## 2019-08-09 DIAGNOSIS — J019 Acute sinusitis, unspecified: Secondary | ICD-10-CM | POA: Diagnosis not present

## 2019-08-09 DIAGNOSIS — D860 Sarcoidosis of lung: Secondary | ICD-10-CM

## 2019-08-09 MED ORDER — GUAIFENESIN-CODEINE 100-10 MG/5ML PO SYRP: 5.0000 mL | ORAL_SOLUTION | Freq: Three times a day (TID) | ORAL | 0 refills | Status: DC | PRN

## 2019-08-09 MED ORDER — LEVOFLOXACIN 500 MG PO TABS: 500.0000 mg | ORAL_TABLET | Freq: Every day | ORAL | 0 refills | Status: DC

## 2019-08-09 MED ORDER — ALBUTEROL SULFATE HFA 108 (90 BASE) MCG/ACT IN AERS: 2.0000 | INHALATION_SPRAY | RESPIRATORY_TRACT | 2 refills | Status: DC | PRN

## 2019-08-09 MED ORDER — IPRATROPIUM BROMIDE 0.06 % NA SOLN: 2.0000 | Freq: Four times a day (QID) | NASAL | 0 refills | Status: DC

## 2019-08-09 MED ORDER — PREDNISONE 20 MG PO TABS: ORAL_TABLET | ORAL | 0 refills | Status: DC

## 2019-08-09 NOTE — Patient Instructions (Addendum)
Refilled Albuterol rescue inhaler Can resume Flovent if want Start Atrovent nasal spray decongestant 2 sprays in each nostril up to 4 times daily for 7 days Cough syrup w/ codeine as needed Keep using Flonase Recommend to start taking Tylenol Extra Strength 500mg  tabs - take 1 to 2 tabs per dose (max 1000mg ) every 6-8 hours for pain or fever, max 24 hour daily dose is 6 tablets or 3000mg   If worsening symptoms or concern for bronchitis pneumonia - Start levaquin 500 daily x 7 days - Start prednisone taper 7 days -----------------------------  If need to be seen in person can call to schedule RESPIRATORY CLINIC for face to face and X-ray if need  If need to be seen at Urgent Care or Hospital those are options as well  ----  [place]  COVID-19 Testing By Appointment Only  Indoor Test site now. No longer outdoor drive up testing.  Online scheduling can be done online at [email or web address] or by texting "COVID" to [phone].  Test result may take 2-7 days to result. You will be notified by MyChart or by Phone.  Phone: [phone] [facility] contact, can inquire about status of test result)  If negative test - they will call you with result. If abnormal or positive test you will be notified as well and our office will contact you to help further with treatment plan.  May take Tylenol as needed for aches pains and fever. Prefer to avoid Ibuprofen if can help it, to avoid complication from virus.  REQUIRED self quarantine to [place] - advised to avoid all exposure with others while during treatment. Should continue to quarantine for up to 10-14 days, pending resolution of symptoms, if symptoms resolve by 7 days and is afebrile >3 days - may STOP self quarantine at that time.  If symptoms do not resolve or significantly improve OR if WORSENING - fever / cough - or worsening shortness of breath - then should contact us and seek advice on  next steps in treatment at home vs where/when to seek care at Urgent Care or Hospital ED for further intervention    Please schedule a Follow-up Appointment to: Return in about 1 week (around 08/16/2019), or if symptoms worsen or fail to improve, for COVID.  If you have any other questions or concerns, please feel free to call the office or send a message through [place]. You may also schedule an earlier appointment if necessary.  Additionally, you may be receiving a survey about your experience at our office within a few days to 1 week by e-mail or mail. We value your feedback.  Nobie Putnam, DO [place]

## 2019-08-09 NOTE — Progress Notes (Signed)
Virtual Visit via Telephone The purpose of this virtual visit is to provide medical care while limiting exposure to the novel coronavirus (COVID19) for both patient and office staff.  Consent was obtained for phone visit:  Yes.   Answered questions that patient had about telehealth interaction:  Yes.   I discussed the limitations, risks, security and privacy concerns of performing an evaluation and management service by telephone. I also discussed with the patient that there may be a patient responsible charge related to this service. The patient expressed understanding and agreed to proceed.  Patient Location: Home Provider Location: [facility])   ---------------------------------------------------------------------- Chief Complaint  Patient presents with  . covid positive    SOB, fever onset 4 days     S: Reviewed CMA documentation. I have called patient and gathered additional HPI as follows:  COVID-19 INFECTION / Pulmonary Sarcoidosis Reports that symptoms started 5 days ago with URI symptoms and fever. She tested positive for COVID19 now.  She is worried about symptoms progressing into chest and getting bronchitis, describes a nagging cough that is mild now but worried about it worsening. She has history of Sarcoidosis and is concerned. She has Flovent HFA regularly but not currently using it now. She has albuterol but is out of it now, needs refill. She has sore throat, sinus congestion and pressure. Currently using Flonase, Singulair. She feels like she is breathing through cotton at times. . Denies any high risk travel to areas of current concern for COVID19. Denies any known or suspected exposure to person with or possibly with COVID19.  Denies any headache, abdominal pain, diarrhea  -------------------------------------------------------------------------- O: No physical exam performed due to remote telephone  encounter.  -------------------------------------------------------------------------- A&P:   COVID-19 INFECTION, Positive confirmed Pulmonary Sarcoidosis  Concern with current symptoms fever/dyspnea, and existing pulmonary condition with sarcoidosis, patient is in category of mild to moderate symptoms with high risk condition.  Will provide empiric coverage in case worsening to bronchitis or bacterial pneumonia - Rx Levaquin 500 daily x 7 days and Prednisone taper 7 days, she has used these before in past with lung infection associated with sarcoidosis  Re order albuterol PRN now May resume Flovent Start Atrovent nasal spray decongestant 2 sprays in each nostril up to 4 times daily for 7 days Cough syrup w/ codeine Tylenol / NSAID PRN  Additional advice on family contact to get tested / self quarantine.  Gave her # to [place] her specific recommendations on where / when to follow-up if worsening or not improving - consider Respiratory Clinic face to face visit for X-ray [place] if need next week. Consider URgent Care vs ED if need  No orders of the defined types were placed in this encounter.   REQUIRED self quarantine to [place] - advised to avoid all exposure with others while during treatment. Should continue to quarantine for up to 10-14 days - pending resolution of symptoms  If symptoms do not resolve or significantly improve OR if WORSENING - fever / cough - or worsening shortness of breath - then should contact us and seek advice on next steps in treatment at home vs where/when to seek care at Urgent Care or Hospital ED for further intervention and possible testing if indicated.  Patient verbalizes understanding with the above medical recommendations including the limitation of remote medical advice.  Specific follow-up / call-back criteria were given for patient to follow-up or seek medical care more urgently if  needed.   - Time spent in direct consultation with patient on phone: 15 minutes  Nobie Putnam, [facility] 08/09/2019, 9:30 AM

## 2019-08-13 ENCOUNTER — Other Ambulatory Visit: Payer: Self-pay | Admitting: Physician Assistant

## 2019-08-13 ENCOUNTER — Ambulatory Visit (HOSPITAL_COMMUNITY)
Admission: RE | Admit: 2019-08-13 | Discharge: 2019-08-13 | Disposition: A | Discharge status: Home / Self Care | Payer: Medicare Other | Source: Ambulatory Visit | Attending: Pulmonary Disease | Admitting: Pulmonary Disease

## 2019-08-13 ENCOUNTER — Emergency Department
Admission: EM | Admit: 2019-08-13 | Discharge: 2019-08-13 | Disposition: A | Discharge status: Home / Self Care | Payer: Medicare PPO | Attending: Emergency Medicine | Admitting: Emergency Medicine

## 2019-08-13 ENCOUNTER — Other Ambulatory Visit: Payer: Self-pay

## 2019-08-13 ENCOUNTER — Emergency Department: Payer: Medicare PPO

## 2019-08-13 DIAGNOSIS — U071 COVID-19: Secondary | ICD-10-CM

## 2019-08-13 DIAGNOSIS — Z23 Encounter for immunization: Secondary | ICD-10-CM | POA: Insufficient documentation

## 2019-08-13 DIAGNOSIS — J45909 Unspecified asthma, uncomplicated: Secondary | ICD-10-CM | POA: Insufficient documentation

## 2019-08-13 DIAGNOSIS — Z7982 Long term (current) use of aspirin: Secondary | ICD-10-CM | POA: Diagnosis not present

## 2019-08-13 DIAGNOSIS — I10 Essential (primary) hypertension: Secondary | ICD-10-CM | POA: Insufficient documentation

## 2019-08-13 DIAGNOSIS — E119 Type 2 diabetes mellitus without complications: Secondary | ICD-10-CM | POA: Insufficient documentation

## 2019-08-13 DIAGNOSIS — R0602 Shortness of breath: Secondary | ICD-10-CM | POA: Insufficient documentation

## 2019-08-13 DIAGNOSIS — Z79899 Other long term (current) drug therapy: Secondary | ICD-10-CM | POA: Diagnosis not present

## 2019-08-13 DIAGNOSIS — D869 Sarcoidosis, unspecified: Secondary | ICD-10-CM

## 2019-08-13 HISTORY — DX: COVID-19: U07.1

## 2019-08-13 LAB — CBC WITH DIFFERENTIAL/PLATELET
Abs Immature Granulocytes: 0.05 10*3/uL (ref 0.00–0.07)
Basophils Absolute: 0 10*3/uL (ref 0.0–0.1)
Basophils Relative: 0 %
Eosinophils Absolute: 0 10*3/uL (ref 0.0–0.5)
Eosinophils Relative: 0 %
HCT: 37.3 % (ref 36.0–46.0)
Hemoglobin: 12.5 g/dL (ref 12.0–15.0)
Immature Granulocytes: 1 %
Lymphocytes Relative: 14 %
Lymphs Abs: 0.9 10*3/uL (ref 0.7–4.0)
MCH: 29.1 pg (ref 26.0–34.0)
MCHC: 33.5 g/dL (ref 30.0–36.0)
MCV: 86.7 fL (ref 80.0–100.0)
Monocytes Absolute: 0.4 10*3/uL (ref 0.1–1.0)
Monocytes Relative: 6 %
Neutro Abs: 5.3 10*3/uL (ref 1.7–7.7)
Neutrophils Relative %: 79 %
Platelets: 267 10*3/uL (ref 150–400)
RBC: 4.3 MIL/uL (ref 3.87–5.11)
RDW: 13.5 % (ref 11.5–15.5)
WBC: 6.7 10*3/uL (ref 4.0–10.5)
nRBC: 0 % (ref 0.0–0.2)

## 2019-08-13 LAB — BASIC METABOLIC PANEL
Anion gap: 10 (ref 5–15)
BUN: 7 mg/dL — ABNORMAL LOW (ref 8–23)
CO2: 23 mmol/L (ref 22–32)
Calcium: 8.3 mg/dL — ABNORMAL LOW (ref 8.9–10.3)
Chloride: 102 mmol/L (ref 98–111)
Creatinine, Ser: 0.57 mg/dL (ref 0.44–1.00)
GFR calc Af Amer: >60 mL/min (ref >60)
GFR calc non Af Amer: >60 mL/min (ref >60)
Glucose, Bld: 151 mg/dL — ABNORMAL HIGH (ref 70–99)
Potassium: 3.5 mmol/L (ref 3.5–5.1)
Sodium: 135 mmol/L (ref 135–145)

## 2019-08-13 LAB — FIBRIN DERIVATIVES D-DIMER (ARMC ONLY): Fibrin derivatives D-dimer (ARMC): 744.01 ng/mL (FEU) — ABNORMAL HIGH (ref 0.00–499.00)

## 2019-08-13 LAB — FERRITIN: Ferritin: 107 ng/mL (ref 11–307)

## 2019-08-13 LAB — PROCALCITONIN: Procalcitonin: <0.1 ng/mL

## 2019-08-13 MED ORDER — DEXAMETHASONE SODIUM PHOSPHATE 10 MG/ML IJ SOLN
6.0000 mg | Freq: Once | INTRAMUSCULAR | Status: AC
  Administered 2019-08-13: 6 mg via INTRAVENOUS
  Filled 2019-08-13: qty 1

## 2019-08-13 MED ORDER — FAMOTIDINE IN NACL 20-0.9 MG/50ML-% IV SOLN: 20.0000 mg | Freq: Once | INTRAVENOUS | Status: DC | PRN

## 2019-08-13 MED ORDER — SODIUM CHLORIDE 0.9 % IV SOLN: INTRAVENOUS | Status: DC | PRN

## 2019-08-13 MED ORDER — METHYLPREDNISOLONE SODIUM SUCC 125 MG IJ SOLR: 125.0000 mg | Freq: Once | INTRAMUSCULAR | Status: DC | PRN

## 2019-08-13 MED ORDER — EPINEPHRINE 0.3 MG/0.3ML IJ SOAJ: 0.3000 mg | Freq: Once | INTRAMUSCULAR | Status: DC | PRN

## 2019-08-13 MED ORDER — DEXAMETHASONE 6 MG PO TABS: 6.0000 mg | ORAL_TABLET | Freq: Every day | ORAL | 0 refills | Status: DC

## 2019-08-13 MED ORDER — DIPHENHYDRAMINE HCL 50 MG/ML IJ SOLN: 50.0000 mg | Freq: Once | INTRAMUSCULAR | Status: DC | PRN

## 2019-08-13 MED ORDER — ALBUTEROL SULFATE HFA 108 (90 BASE) MCG/ACT IN AERS: 2.0000 | INHALATION_SPRAY | Freq: Once | RESPIRATORY_TRACT | Status: DC | PRN

## 2019-08-13 MED ORDER — SODIUM CHLORIDE 0.9 % IV SOLN
700.0000 mg | Freq: Once | INTRAVENOUS | Status: AC
  Administered 2019-08-13: 700 mg via INTRAVENOUS
  Filled 2019-08-13: qty 20

## 2019-08-13 NOTE — Discharge Instructions (Signed)

## 2019-08-13 NOTE — ED Notes (Signed)
X-ray at bedside

## 2019-08-13 NOTE — Progress Notes (Signed)
  Diagnosis: COVID-19  Physician: Dr. Wright  Procedure: Covid Infusion Clinic Med: bamlanivimab infusion - Provided patient with bamlanimivab fact sheet for patients, parents and caregivers prior to infusion.  Complications: No immediate complications noted.  Discharge: Discharged home   Suresh Audi 08/13/2019  

## 2019-08-13 NOTE — ED Notes (Cosign Needed)
Pt ambulatory O2 completed with steady gait. Pt stopped often to take deep breaths but maintained O2 of 95% when walking to and from door.
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
## 2019-08-13 NOTE — ED Triage Notes (Signed)
Pt comes via [facility] from home wit hc/o SOB. Pt states +COVID test on Thursday.  Pt states fever and chills. Pt states fever of 101.0.  EMS reports VSS and CBG of 171.  Pt states no chest pain but jsut some acid reflux.

## 2019-08-13 NOTE — Progress Notes (Signed)
  I connected by phone with [name] on 08/13/2019 at 11:21 AM to discuss the potential use of an new treatment for mild to moderate COVID-19 viral infection in non-hospitalized patients.  This patient is a 74 y.o. female that meets the FDA criteria for Emergency Use Authorization of bamlanivimab or casirivimab\imdevimab.  Has a (+) direct SARS-CoV-2 viral test result  Has mild or moderate COVID-19   Is ? 72 years of age and weighs ? 40 kg  Is NOT hospitalized due to COVID-19  Is NOT requiring oxygen therapy or requiring an increase in baseline oxygen flow rate due to COVID-19  Is within 10 days of symptom onset  Has at least one of the high risk factor(s) for progression to severe COVID-19 and/or hospitalization as defined in EUA.  Specific high risk criteria : >/= 72 yo   Hx of HTN, DM and pulmonary sarcoid   I have spoken and communicated the following to the patient or parent/caregiver:  1. FDA has authorized the emergency use of bamlanivimab and casirivimab\imdevimab for the treatment of mild to moderate COVID-19 in adults and pediatric patients with positive results of direct SARS-CoV-2 viral testing who are 58 years of age and older weighing at least 40 kg, and who are at high risk for progressing to severe COVID-19 and/or hospitalization.  2. The significant known and potential risks and benefits of bamlanivimab and casirivimab\imdevimab, and the extent to which such potential risks and benefits are unknown.  3. Information on available alternative treatments and the risks and benefits of those alternatives, including clinical trials.  4. Patients treated with bamlanivimab and casirivimab\imdevimab should continue to self-isolate and use infection control measures (e.g., wear mask, isolate, social distance, avoid sharing personal items, clean and disinfect "high touch" surfaces, and frequent handwashing) according to CDC guidelines.   5. The patient or  parent/caregiver has the option to accept or refuse bamlanivimab or casirivimab\imdevimab .  After reviewing this information with the patient, The patient agreed to proceed with receiving the bamlanimivab infusion and will be provided a copy of the Fact sheet prior to receiving the infusion.Crista Luria Greysin Medlen 08/13/2019 11:21 AM

## 2019-08-13 NOTE — Discharge Instructions (Addendum)
Please seek medical attention for any high fevers, chest pain, shortness of breath, change in behavior, persistent vomiting, bloody stool or any other new or concerning symptoms.  

## 2019-08-15 ENCOUNTER — Telehealth: Payer: Self-pay | Admitting: Family Medicine

## 2019-08-15 DIAGNOSIS — J029 Acute pharyngitis, unspecified: Secondary | ICD-10-CM

## 2019-08-15 MED ORDER — SUCRALFATE 1 G PO TABS: 1.0000 g | ORAL_TABLET | Freq: Three times a day (TID) | ORAL | 0 refills | Status: DC

## 2019-08-15 NOTE — Telephone Encounter (Signed)
Ordered carafate as requested.  She can follow-up with virtual visit if not improving. Or if she has shortness of breath and needs X-ray or other concerns she can be seen sooner by Respiratory Clinic,  Next week  Nobie Putnam, [facility] 08/15/2019, 6:43 PM

## 2019-08-15 NOTE — Telephone Encounter (Signed)
Patient was covid positive and on 01/26 she had  SOB and hypoxic Sxs -her nails were turning blue so went and got treated by ED she was diagnosed with pneumonia and also had infusion 01/26. Her Sxs has been improved but still has irritation in her throat it is not sore but felt same way like she has acid reflux in past. Per patient she was taking pink pill which she dissolve in water and taken 3 times in past 10 years ago had improved her Sx's, So wanted to know if Dr.K can Rx same one or different one, from her chart Carafate 1 mg was taken in past and patient agree that it can be Carafate. Please suggest ?

## 2019-08-16 NOTE — Telephone Encounter (Signed)
Patient informed as per Dr.K and also scheduled her appt for Respiratory clinic on Monday 08/19/2019 at 5:30 pm for SOB, Fatigue, still has cough, throat irritation.

## 2019-08-19 ENCOUNTER — Encounter: Payer: Self-pay | Admitting: Internal Medicine

## 2019-08-19 ENCOUNTER — Ambulatory Visit
Admission: RE | Admit: 2019-08-19 | Discharge: 2019-08-19 | Disposition: A | Discharge status: Home / Self Care | Payer: Medicare PPO | Source: Ambulatory Visit | Attending: Internal Medicine | Admitting: Internal Medicine

## 2019-08-19 ENCOUNTER — Ambulatory Visit (INDEPENDENT_AMBULATORY_CARE_PROVIDER_SITE_OTHER): Payer: Medicare PPO | Admitting: Internal Medicine

## 2019-08-19 ENCOUNTER — Other Ambulatory Visit: Payer: Self-pay

## 2019-08-19 ENCOUNTER — Ambulatory Visit
Admission: RE | Admit: 2019-08-19 | Discharge: 2019-08-19 | Disposition: A | Discharge status: Home / Self Care | Payer: Medicare PPO | Source: Ambulatory Visit | Attending: Family Medicine | Admitting: Family Medicine

## 2019-08-19 VITALS — BP 143/64 | HR 107 | Temp 98.3°F | Resp 20 | Ht 62.0 in | Wt 184.0 lb

## 2019-08-19 DIAGNOSIS — R0602 Shortness of breath: Secondary | ICD-10-CM | POA: Diagnosis present

## 2019-08-19 DIAGNOSIS — Z8616 Personal history of COVID-19: Secondary | ICD-10-CM

## 2019-08-19 DIAGNOSIS — J189 Pneumonia, unspecified organism: Secondary | ICD-10-CM
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
## 2019-08-19 NOTE — Patient Instructions (Addendum)
Take the following supplements to help your immune system be stronger to fight this viral infection Take Quarcetin 500 mg three times a day x 7 days with Zinc 50 mg ones a day x 7 days. The quarcetin is an antiviral and anti-inflammatory supplement which helps open the zinc channels in the cell to absorb Zinc. Zinc helps decrease the virus load in your body.  Also make sure to take Vit D 5,000 IU per day with a fatty meal and Vit C 1000 mg a day until you are completely better. Stay on Vitamin D 2,000 the rest of the season. I dont see you have had any Vit D levels done in the labs I checked in Epic. Have those checked the next time you see your family Dr.    Also do Netie Pot Saline rinses twice a day for 5- 7 days to clean out the post nasal drainage. Watch this video who to do saline nose rinses.  [email or web address]   Do not use tap water, only boiled water that has been cooled down.  Do it twice a day  for 5-7 days, but avoid bed time.   STAY ACTIVE AND AVOID LAYING STILL FOR MORE THAN 2 HOURS. TRY TO WALK EVERY HOUR YOU ARE RESTING LAYING DOWN TO PREVENT CLOTS FROM FORMING. Do not suppress your cough unless bothersome, so you can rid of the mucous matter in your lungs.   If possible have someone to chest percussion to help dislodge the phlegm from your lungs.    Community-Acquired Pneumonia, Adult Pneumonia is an infection of the lungs. It causes swelling in the airways of the lungs. Mucus and fluid may also build up inside the airways. One type of pneumonia can happen while a person is in a hospital. A different type can happen when a person is not in a hospital (community-acquired pneumonia).  What are the causes?  This condition is caused by germs (viruses, bacteria, or fungi). Some types of germs can be passed from one person to another. This can happen when you breathe in  droplets from the cough or sneeze of an infected person. What increases the risk? You are more likely to develop this condition if you:  Have a long-term (chronic) disease, such as: ? Chronic obstructive pulmonary disease (COPD). ? Asthma. ? Cystic fibrosis. ? Congestive heart failure. ? Diabetes. ? Kidney disease.  Have HIV.  Have sickle cell disease.  Have had your spleen removed.  Do not take good care of your teeth and mouth (poor dental hygiene).  Have a medical condition that increases the risk of breathing in droplets from your own mouth and nose.  Have a weakened body defense system (immune system).  Are a smoker.  Travel to areas where the germs that cause this illness are common.  Are around certain animals or the places they live. What are the signs or symptoms?  A dry cough.  A wet (productive) cough.  Fever.  Sweating.  Chest pain. This often happens when breathing deeply or coughing.  Fast breathing or trouble breathing.  Shortness of breath.  Shaking chills.  Feeling tired (fatigue).  Muscle aches. How is this treated? Treatment for this condition depends on many things. Most adults can be treated at home. In some cases, treatment must happen in a hospital. Treatment may include:  Medicines given by mouth or through an IV tube.  Being given extra oxygen.  Respiratory therapy. In rare cases, treatment for very bad pneumonia may include:  Using a machine to help you breathe.  Having a procedure to remove fluid from around your lungs. Follow these instructions at home: Medicines  Take over-the-counter and prescription medicines only as told by your doctor. ? Only take cough medicine if you are losing sleep.  If you were prescribed an antibiotic medicine, take it as told by your doctor. Do not stop taking the antibiotic even if you start to feel better. General instructions   Sleep with your head and neck raised (elevated). You can  do this by sleeping in a recliner or by putting a few pillows under your head.  Rest as needed. Get at least 8 hours of sleep each night.  Drink enough water to keep your pee (urine) pale yellow.  Eat a healthy diet that includes plenty of vegetables, fruits, whole grains, low-fat dairy products, and lean protein.  Do not use any products that contain nicotine or tobacco. These include cigarettes, e-cigarettes, and chewing tobacco. If you need help quitting, ask your doctor.  Keep all follow-up visits as told by your doctor. This is important. How is this prevented? A shot (vaccine) can help prevent pneumonia. Shots are often suggested for:  People older than 72 years of age.  People older than 72 years of age who: ? Are having cancer treatment. ? Have long-term (chronic) lung disease. ? Have problems with their body's defense system. You may also prevent pneumonia if you take these actions:  Get the flu (influenza) shot every year.  Go to the dentist as often as told.  Wash your hands often. If you cannot use soap and water, use hand sanitizer. Contact a doctor if:  You have a fever.  You lose sleep because your cough medicine does not help. Get help right away if:  You are short of breath and it gets worse.  You have more chest pain.  Your sickness gets worse. This is very serious if: ? You are an older adult. ? Your body's defense system is weak.  You cough up blood. Summary  Pneumonia is an infection of the lungs.  Most adults can be treated at home. Some will need treatment in a hospital.  Drink enough water to keep your pee pale yellow.  Get at least 8 hours of sleep each night. This information is not intended to replace advice given to you by your health care provider. Make sure you discuss any questions you have with your health care provider. Document Revised: 10/24/2018 Document Reviewed: 03/01/2018 Elsevier Patient Education  [place].

## 2019-08-27 ENCOUNTER — Ambulatory Visit (INDEPENDENT_AMBULATORY_CARE_PROVIDER_SITE_OTHER): Payer: Medicare PPO

## 2019-08-27 VITALS — Ht 61.0 in | Wt 187.0 lb

## 2019-08-27 DIAGNOSIS — Z0000 Encounter for general adult medical examination without abnormal findings: Secondary | ICD-10-CM | POA: Diagnosis not present
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
## 2019-08-27 NOTE — Patient Instructions (Signed)
Mary Burke , Thank you for taking time to come for yourMedicare Wellness Visit. I appreciate your ongoing commitment to your health goals. Please review the following plan we discussed and let me know if I can assist you in the future.   Screening recommendations/referrals: Colonoscopy: completed 01/29/2015 Mammogram: completed 05/21/2018 Bone Density: completed 05/21/2018 Recommended yearly ophthalmology/optometry visit for glaucoma screening and checkup Recommended yearly dental visit for hygiene and checkup  Vaccinations: Influenza vaccine: up to date Pneumococcal vaccine: up to date Tdap vaccine: up to date Shingles vaccine: completed series     Advanced directives: copy on file  Conditions/risks identified: please call if you need anything!  Next appointment: Follow up in one year for your annual wellness exam.    Preventive Care 65 Years and Older, Female  Preventive care refers to lifestyle choices and visits with your health care provider that can promote health and wellness. What does preventive care include?  A yearly physical exam. This is also called an annual well check.  Dental exams once or twice a year.  Routine eye exams. Ask your health care provider how often you should have your eyes checked.  Personal lifestyle choices, including: ? Daily care of your teeth and gums. ? Regular physical activity. ? Eating a healthy diet. ? Avoiding tobacco and drug use. ? Limiting alcohol use. ? Practicing safe sex. ? Taking low-dose aspirin every day. ? Taking vitamin and mineral supplements as recommended by your health care provider. What happens during an annual well check? The services and screenings done by your health care provider during your annual well check will depend on your age, overall health, lifestyle risk factors, and family history of disease. Counseling  Your health care provider may ask you questions about your:  Alcohol use.  Tobacco  use.  Drug use.  Emotional well-being.  Home and relationship well-being.  Sexual activity.  Eating habits.  History of falls.  Memory and ability to understand (cognition).  Work and work Statistician.  Reproductive health. Screening  You may have the following tests or measurements:  Height, weight, and BMI.  Blood pressure.  Lipid and cholesterol levels. These may be checked every 5 years, or more frequently if you are over 56 years old.  Skin check.  Lung cancer screening. You may have this screening every year starting at age 76 if you have a 30-pack-year history of smoking and currently smoke or have quit within the past 15 years.  Fecal occult blood test (FOBT) of the stool. You may have this test every year starting at age 62.  Flexible sigmoidoscopy or colonoscopy. You may have a sigmoidoscopy every 5 years or a colonoscopy every 10 years starting at age 26.  Hepatitis C blood test.  Hepatitis B blood test.  Sexually transmitted disease (STD) testing.  Diabetes screening. This is done by checking your blood sugar (glucose) after you have not eaten for a while (fasting). You may have this done every 1-3 years.  Bone density scan. This is done to screen for osteoporosis. You may have this done starting at age 7.  Mammogram. This may be done every 1-2 years. Talk to your health care provider about how often you should have regular mammograms. Talk with your health care provider about your test results, treatment options, and if necessary, the need for more tests. Vaccines   Your health care provider may recommend certain vaccines, such as:  Influenza vaccine. This is recommended every year.  Tetanus, diphtheria, and acellular pertussis (  Tdap, Td) vaccine. You may need a Td booster every 10 years.  Zoster vaccine. You may need this after age 57.  Pneumococcal 13-valent conjugate (PCV13) vaccine. One dose is recommended after age 72.  Pneumococcal  polysaccharide (PPSV23) vaccine. One dose is recommended after age 11. Talk to your health care provider about which screenings and vaccines you need and how often you need them. This information is not intended to replace advice given to you by your health care provider. Make sure you discuss any questions you have with your health care provider. Document Released: 07/31/2015 Document Revised: 03/23/2016 Document Reviewed: 05/05/2015 Elsevier Interactive Patient Education  2017 [place] Prevention in the Home  Falls can cause injuries. They can happen to people of all ages. There are many things you can do to make your home safe and to help prevent falls. What can I do on the outside of my home?  Regularly fix the edges of walkways and driveways and fix any cracks.  Remove anything that might make you trip as you walk through a door, such as a raised step or threshold.  Trim any bushes or trees on the path to your home.  Use bright outdoor lighting.  Clear any walking paths of anything that might make someone trip, such as rocks or tools.  Regularly check to see if handrails are loose or broken. Make sure that both sides of any steps have handrails.  Any raised decks and porches should have guardrails on the edges.  Have any leaves, snow, or ice cleared regularly.  Use sand or salt on walking paths during winter.  Clean up any spills in your garage right away. This includes oil or grease spills. What can I do in the bathroom?  Use night lights.  Install grab bars by the toilet and in the tub and shower. Do not use towel bars as grab bars.  Use non-skid mats or decals in the tub or shower.  If you need to sit down in the shower, use a plastic, non-slip stool.  Keep the floor dry. Clean up any water that spills on the floor as soon as it happens.  Remove soap buildup in the tub or shower regularly.  Attach bath mats securely with double-sided non-slip rug  tape.  Do not have throw rugs and other things on the floor that can make you trip. What can I do in the bedroom?  Use night lights.  Make sure that you have a light by your bed that is easy to reach.  Do not use any sheets or blankets that are too big for your bed. They should not hang down onto the floor.  Have a firm chair that has side arms. You can use this for support while you get dressed.  Do not have throw rugs and other things on the floor that can make you trip. What can I do in the kitchen?  Clean up any spills right away.  Avoid walking on wet floors.  Keep items that you use a lot in easy-to-reach places.  If you need to reach something above you, use a strong step stool that has a grab bar.  Keep electrical cords out of the way.  Do not use floor polish or wax that makes floors slippery. If you must use wax, use non-skid floor wax.  Do not have throw rugs and other things on the floor that can make you trip. What can I do with my stairs?  Do not leave any items on the stairs.  Make sure that there are handrails on both sides of the stairs and use them. Fix handrails that are broken or loose. Make sure that handrails are as long as the stairways.  Check any carpeting to make sure that it is firmly attached to the stairs. Fix any carpet that is loose or worn.  Avoid having throw rugs at the top or bottom of the stairs. If you do have throw rugs, attach them to the floor with carpet tape.  Make sure that you have a light switch at the top of the stairs and the bottom of the stairs. If you do not have them, ask someone to add them for you. What else can I do to help prevent falls?  Wear shoes that: ? Do not have high heels. ? Have rubber bottoms. ? Are comfortable and fit you well. ? Are closed at the toe. Do not wear sandals.  If you use a stepladder: ? Make sure that it is fully opened. Do not climb a closed stepladder. ? Make sure that both sides of the  stepladder are locked into place. ? Ask someone to hold it for you, if possible.  Clearly mark and make sure that you can see: ? Any grab bars or handrails. ? First and last steps. ? Where the edge of each step is.  Use tools that help you move around (mobility aids) if they are needed. These include: ? Canes. ? Walkers. ? Scooters. ? Crutches.  Turn on the lights when you go into a dark area. Replace any light bulbs as soon as they burn out.  Set up your furniture so you have a clear path. Avoid moving your furniture around.  If any of your floors are uneven, fix them.  If there are any pets around you, be aware of where they are.  Review your medicines with your doctor. Some medicines can make you feel dizzy. This can increase your chance of falling. Ask your doctor what other things that you can do to help prevent falls. This information is not intended to replace advice given to you by your health care provider. Make sure you discuss any questions you have with your health care provider. Document Released: 04/30/2009 Document Revised: 12/10/2015 Document Reviewed: 08/08/2014 Elsevier Interactive Patient Education  2017 [employer].

## 2019-08-29 ENCOUNTER — Other Ambulatory Visit: Payer: Self-pay | Admitting: Family Medicine

## 2019-08-29 DIAGNOSIS — J029 Acute pharyngitis, unspecified: Secondary | ICD-10-CM

## 2019-09-04 ENCOUNTER — Other Ambulatory Visit: Payer: Self-pay | Admitting: Nurse Practitioner

## 2019-09-04 DIAGNOSIS — J029 Acute pharyngitis, unspecified: Secondary | ICD-10-CM

## 2019-09-04 NOTE — Telephone Encounter (Signed)
Pt  Called requesting refill on sucralfate  1 g tablet

## 2019-09-18 DIAGNOSIS — E119 Type 2 diabetes mellitus without complications: Secondary | ICD-10-CM | POA: Diagnosis not present

## 2019-09-18 DIAGNOSIS — R600 Localized edema: Secondary | ICD-10-CM | POA: Diagnosis not present

## 2019-09-18 DIAGNOSIS — I700 Atherosclerosis of aorta: Secondary | ICD-10-CM | POA: Diagnosis not present

## 2019-09-18 DIAGNOSIS — D860 Sarcoidosis of lung: Secondary | ICD-10-CM | POA: Diagnosis not present

## 2019-09-18 DIAGNOSIS — E782 Mixed hyperlipidemia: Secondary | ICD-10-CM | POA: Diagnosis not present

## 2019-09-18 DIAGNOSIS — R0602 Shortness of breath: Secondary | ICD-10-CM | POA: Diagnosis not present

## 2019-09-18 DIAGNOSIS — I10 Essential (primary) hypertension: Secondary | ICD-10-CM | POA: Diagnosis not present

## 2019-09-24 DIAGNOSIS — M5416 Radiculopathy, lumbar region: Secondary | ICD-10-CM | POA: Diagnosis not present

## 2019-09-24 DIAGNOSIS — M9903 Segmental and somatic dysfunction of lumbar region: Secondary | ICD-10-CM | POA: Diagnosis not present

## 2019-09-24 DIAGNOSIS — M9905 Segmental and somatic dysfunction of pelvic region: Secondary | ICD-10-CM | POA: Diagnosis not present

## 2019-09-24 DIAGNOSIS — M955 Acquired deformity of pelvis: Secondary | ICD-10-CM | POA: Diagnosis not present

## 2019-09-27 DIAGNOSIS — R0602 Shortness of breath: Secondary | ICD-10-CM | POA: Diagnosis not present

## 2019-10-03 DIAGNOSIS — D860 Sarcoidosis of lung: Secondary | ICD-10-CM | POA: Diagnosis not present

## 2019-10-16 ENCOUNTER — Other Ambulatory Visit: Payer: Self-pay

## 2019-10-16 ENCOUNTER — Ambulatory Visit: Payer: Medicare PPO | Admitting: Dermatology

## 2019-10-16 DIAGNOSIS — D2230 Melanocytic nevi of unspecified part of face: Secondary | ICD-10-CM | POA: Diagnosis not present

## 2019-10-16 DIAGNOSIS — L820 Inflamed seborrheic keratosis: Secondary | ICD-10-CM | POA: Diagnosis not present

## 2019-10-16 DIAGNOSIS — L720 Epidermal cyst: Secondary | ICD-10-CM

## 2019-10-16 DIAGNOSIS — L578 Other skin changes due to chronic exposure to nonionizing radiation: Secondary | ICD-10-CM

## 2019-10-16 DIAGNOSIS — L821 Other seborrheic keratosis: Secondary | ICD-10-CM

## 2019-10-16 DIAGNOSIS — D229 Melanocytic nevi, unspecified: Secondary | ICD-10-CM

## 2019-10-16 DIAGNOSIS — D1801 Hemangioma of skin and subcutaneous tissue: Secondary | ICD-10-CM | POA: Diagnosis not present

## 2019-10-16 MED ORDER — ADAPALENE 0.3 % EX GEL: 1.0000 "application " | Freq: Every day | CUTANEOUS | 1 refills | Status: DC

## 2019-10-16 NOTE — Progress Notes (Signed)
   New Patient Visit  Subjective  Mary Burke is a 74 y.o. female who presents for the following: Other (Suspicious spots of face and neck.). She also has spots on her chest that are irritating for she which she desires treatment.  She has other spots for which she would like evaluation.  Some of them are not symptomatic but she wants to know what they are.   Objective  Well appearing patient in no apparent distress; mood and affect are within normal limits.  A focused examination was performed including face, chest. Relevant physical exam findings are noted in the Assessment and Plan.  Objective  Face, chest: Actinic changes.  Objective  Chest: Red papules.   Objective  Left Deltoid, Right Forehead, Right clavicle: Erythematous keratotic or waxy stuck-on papule or plaque.   Objective  Face: Smooth white papule(s).   Objective  Face: Flesh colored papules.  Objective  Trunk: Stuck-on, waxy, tan-brown papule or plaque --Discussed benign etiology and prognosis.   Assessment & Plan  Actinic skin damage Face, chest  Recommend broad spectrum SPF and photoprotection   Hemangioma of skin Chest  Observe   Inflamed seborrheic keratosis (3) Left Deltoid; Right clavicle; Right Forehead  Destruction of lesion - Left Deltoid, Right Forehead, Right clavicle Complexity: simple   Destruction method: cryotherapy   Informed consent: discussed and consent obtained   Timeout:  patient name, date of birth, surgical site, and procedure verified Lesion destroyed using liquid nitrogen: Yes   Region frozen until ice ball extended beyond lesion: Yes   Outcome: patient tolerated procedure well with no complications   Post-procedure details: wound care instructions given    Milia Face  Adapalene (DIFFERIN) 0.3 % gel - Face  Nevus Face  Benign, observe.    Seborrheic keratosis Trunk  Discussed LN2. Advised patient fee is $60 for first lesion and $15 each  additional for cosmetic treatment.   Return if symptoms worsen or fail to improve.   I, Ashok Cordia, CMA, am acting as scribe for Sarina Ser, MD .

## 2019-10-23 DIAGNOSIS — M955 Acquired deformity of pelvis: Secondary | ICD-10-CM | POA: Diagnosis not present

## 2019-10-23 DIAGNOSIS — M9903 Segmental and somatic dysfunction of lumbar region: Secondary | ICD-10-CM | POA: Diagnosis not present

## 2019-10-23 DIAGNOSIS — M5416 Radiculopathy, lumbar region: Secondary | ICD-10-CM | POA: Diagnosis not present

## 2019-10-23 DIAGNOSIS — M9905 Segmental and somatic dysfunction of pelvic region: Secondary | ICD-10-CM | POA: Diagnosis not present

## 2019-10-31 ENCOUNTER — Ambulatory Visit: Payer: Medicare Other | Admitting: Nurse Practitioner

## 2019-11-01 ENCOUNTER — Other Ambulatory Visit: Payer: Self-pay | Admitting: Family Medicine

## 2019-11-01 DIAGNOSIS — J029 Acute pharyngitis, unspecified: Secondary | ICD-10-CM

## 2019-11-01 NOTE — Telephone Encounter (Signed)
Requested medication (s) are due for refill today - no  Requested medication (s) are on the active medication list -yes  Future visit scheduled -no  Last refill: 08/15/19  Notes to clinic: Patient reported not taking 10/16/19- sent for review of Rx request  Requested Prescriptions  Pending Prescriptions Disp Refills   sucralfate (CARAFATE) 1 g tablet [Pharmacy Med Name: SUCRALFATE 1GM TABLETS] 30 tablet 0    Sig: TAKE 1 TABLET BY MOUTH FOUR TIMES DAILY WITH MEALS AND AT BEDTIME      Gastroenterology: Antiacids Passed - 11/01/2019  2:58 PM      Passed - Valid encounter within last 12 months    Recent Outpatient Visits           2 months ago SOB (shortness of breath)   [place], [place]   2 months ago COVID-19 virus infection   [place], [place]   5 months ago Controlled type 2 diabetes mellitus without complication, without long-term current use of insulin ([place])   [facility] Merrily[facility] Puma, Jerrel Ivory, NP   1 year ago Pulmonary sarcoidosis [facility])   [facility] Merrilyn Puma, Jerrel Ivory, NP   1 year ago Controlled type 2 diabetes mellitus without complication, without long-term current use of insulin ([place])   [facility] Merrilyn Puma, Jerrel Ivory, NP       Future Appointments             In 3 weeks Rodriguez-Southworth, Sunday Spill[place], [place] [facility]                Requested Prescriptions  Pending Prescriptions Disp Refills   sucralfate (CARAFATE) 1 g tablet [Pharmacy Med Name: SUCRALFATE 1GM TABLETS] 30 tablet 0    Sig: TAKE 1 TABLET BY MOUTH FOUR TIMES DAILY WITH MEALS AND AT BEDTIME      Gastroenterology: Antiacids Passed - 11/01/2019  2:58 PM      Passed - Valid encounter within last 12 months    Recent Outpatient Visits           2 months ago SOB (shortness of breath)   [place],  [place]   2 months ago COVID-19 virus infection   [place], DO   5 months ago Controlled type 2 diabetes mellitus without complication, without long-term current use of insulin [facility])   [facility] Merrilyn Puma, Jerrel Ivory, NP   1 year ago Pulmonary sarcoidosis [facility])   [facility] Merrilyn Puma, Jerrel Ivory, NP   1 year ago Controlled type 2 diabetes mellitus without complication, without long-term current use of insulin [facility])   [facility] Merrilyn Puma, Jerrel Ivory, NP       Future Appointments             In 3 weeks Rodriguez-Southworth, Sunday Spillers, [place] [facility]

## 2019-11-06 ENCOUNTER — Other Ambulatory Visit: Payer: Self-pay

## 2019-11-06 ENCOUNTER — Ambulatory Visit (INDEPENDENT_AMBULATORY_CARE_PROVIDER_SITE_OTHER): Payer: Medicare PPO | Admitting: Family Medicine

## 2019-11-06 ENCOUNTER — Encounter: Payer: Self-pay | Admitting: Family Medicine

## 2019-11-06 VITALS — BP 129/69 | HR 86 | Temp 97.5°F | Resp 16 | Ht 61.0 in | Wt 191.2 lb

## 2019-11-06 DIAGNOSIS — R140 Abdominal distension (gaseous): Secondary | ICD-10-CM | POA: Diagnosis not present

## 2019-11-06 DIAGNOSIS — Z1211 Encounter for screening for malignant neoplasm of colon: Secondary | ICD-10-CM | POA: Diagnosis not present

## 2019-11-06 DIAGNOSIS — E119 Type 2 diabetes mellitus without complications: Secondary | ICD-10-CM

## 2019-11-06 DIAGNOSIS — Z1231 Encounter for screening mammogram for malignant neoplasm of breast: Secondary | ICD-10-CM | POA: Diagnosis not present

## 2019-11-06 DIAGNOSIS — Z13820 Encounter for screening for osteoporosis: Secondary | ICD-10-CM

## 2019-11-06 DIAGNOSIS — R8290 Unspecified abnormal findings in urine: Secondary | ICD-10-CM | POA: Diagnosis not present

## 2019-11-06 DIAGNOSIS — I10 Essential (primary) hypertension: Secondary | ICD-10-CM | POA: Diagnosis not present

## 2019-11-06 DIAGNOSIS — B351 Tinea unguium: Secondary | ICD-10-CM | POA: Insufficient documentation

## 2019-11-06 LAB — POCT URINALYSIS DIPSTICK
Bilirubin, UA: NEGATIVE
Glucose, UA: NEGATIVE
Ketones, UA: NEGATIVE
Leukocytes, UA: NEGATIVE
Nitrite, UA: NEGATIVE
Protein, UA: NEGATIVE
Spec Grav, UA: 1.015 (ref 1.010–1.025)
Urobilinogen, UA: 0.2 E.U./dL
pH, UA: 5 (ref 5.0–8.0)

## 2019-11-06 LAB — POCT GLYCOSYLATED HEMOGLOBIN (HGB A1C): Hemoglobin A1C: 6.2 % — AB (ref 4.0–5.6)

## 2019-11-06 LAB — POCT UA - MICROALBUMIN: Microalbumin Ur, POC: 20 mg/L

## 2019-11-06 NOTE — Assessment & Plan Note (Signed)
Well-controlledDM with A1c 6.2 relatively unchanged from 05/07/2019 when A1C was 6.1% and goal A1c < 7.0%.  Plan:  1. Continue current therapy: diet and exercise controlled 2. Encourage improved lifestyle: - Increase physical activity to 30 minutes most days of the week.   3. Check fasting am CBG and log these.  Bring log to next visit for review 4. Continue ARB 5. DM Foot exam done today with no acute findings and advised to schedule DM ophtho exam, send record. 6. Follow-up 6 months

## 2019-11-06 NOTE — Assessment & Plan Note (Signed)
Stable and well controlled hypertension.  BP is at goal < 130/80.  Pt is working on lifestyle modifications.  Taking medications tolerating well without side effects. No known complications.  Plan: 1. Continue taking losartan 25mg  daily 2. Obtain labs after next visit in 6 months 3. Encouraged heart healthy diet and increasing exercise to 30 minutes most days of the week, going no more than 2 days in a row without exercise. 4. Check BP 1-2 x per week at home, keep log, and bring to clinic at next appointment. 5. Follow up 6 months.

## 2019-11-06 NOTE — Progress Notes (Signed)
Subjective:    Patient ID: Mary Burke, female    DOB: 24-Oct-1945, 74 y.o.   MRN: [id number]  Mary Burke is a 74 y.o. female presenting on 11/06/2019 for Diabetes   HPI  Diabetes Pt presents today for follow up Type 2 Diabetes Mellitus.  He/she (caps): She is checking AM CBG at home with range of 110-120's, when she is checking her glucose. -Current diabetic medications include: Diet and exercise management -ACTION; IS/IS NOT: is not currently symptomatic -Actions; denies/reports/admits to: denies polydipsia, polyphagia, polyuria, headaches, diaphoresis, shakiness, chills, pain, numbness or tingling in extremities or changes in vision -Clinical course has been stable.  A1C 6.2% today, last A1C was 6.1 on 05/07/2019 -Reports returning to exercise routine with silver sneakers program  PREVENTION Eye exam current (within 1 year) Due next 05/2020 Foot exam current (within 1 year) Completed today Lipid/ASCVD risk reduction - on statin: YES/NO: No  Kidney Protection (On ACE/ARB)? YES/NO: Yes   Hypertension - She is checking BP at home or outside of clinic.  Readings similar as to in clinic readings - Current medications: losartan 25mg  daily, tolerating well without side effects - She is not currently symptomatic. - Pt denies headache, lightheadedness, dizziness, changes in vision, chest tightness/pressure, palpitations, leg swelling, sudden loss of speech or loss of consciousness. - She  has recently started to increase her exercise with beginning the silver sneakers program.  Has concerns of gas that begins in her left lower quadrant, that can be reported as sounding like water bubbles in a water cooler.  Has not found a correlation with food and these symptoms.  Denies any changes in bowel habits, n/v/d.  Reports symptoms have been going on approximately 2 weeks.  Depression screen [facility] 2/9 08/27/2019 08/21/2018 08/15/2017  Decreased Interest 0 0 0  Down, Depressed,  Hopeless 0 0 0  PHQ - 2 Score 0 0 0    Social History   Tobacco Use  . Smoking status: Never Smoker  . Smokeless tobacco: Never Used  Substance Use Topics  . Alcohol use: No  . Drug use: No    Review of Systems  Constitutional: Negative.   HENT: Negative.   Eyes: Negative.   Respiratory: Negative.   Cardiovascular: Negative.   Gastrointestinal: Negative.        Reports increased gas x2 weeks  Endocrine: Negative.   Genitourinary: Negative.   Musculoskeletal: Negative.   Skin: Negative.   Allergic/Immunologic: Negative.   Neurological: Negative.   Hematological: Negative.   Psychiatric/Behavioral: Negative.    Per HPI unless specifically indicated above     Objective:    BP 129/69 (BP Location: Left Arm, Patient Position: Sitting, Cuff Size: Normal)   Pulse 86   Temp (!) 97.5 F (36.4 C) (Temporal)   Resp 16   Ht 5\' 1"  (1.549 m)   Wt 191 lb 3.2 oz (86.7 kg)   SpO2 98%   BMI 36.13 kg/m   Wt Readings from Last 3 Encounters:  11/06/19 191 lb 3.2 oz (86.7 kg)  08/27/19 187 lb (84.8 kg)  08/19/19 184 lb (83.5 kg)    Physical Exam Vitals reviewed.  Constitutional:      General: She is not in acute distress.    Appearance: Normal appearance. She is well-developed and well-groomed. She is obese. She is not ill-appearing or toxic-appearing.  HENT:     Head: Normocephalic.  Eyes:     General: Lids are normal. Vision grossly intact.  Right eye: No discharge.        Left eye: No discharge.     Extraocular Movements: Extraocular movements intact.     Conjunctiva/sclera: Conjunctivae normal.     Pupils: Pupils are equal, round, and reactive to light.  Cardiovascular:     Rate and Rhythm: Normal rate and regular rhythm.     Pulses: Normal pulses.          Dorsalis pedis pulses are 2+ on the right side and 2+ on the left side.       Posterior tibial pulses are 2+ on the right side and 2+ on the left side.     Heart sounds: Normal heart sounds. No murmur. No  friction rub. No gallop.   Pulmonary:     Effort: Pulmonary effort is normal. No respiratory distress.     Breath sounds: Normal breath sounds.  Abdominal:     General: Abdomen is flat. Bowel sounds are normal. There is no distension.     Palpations: Abdomen is soft. There is no mass.     Tenderness: There is no abdominal tenderness. There is no guarding or rebound.     Hernia: No hernia is present.  Musculoskeletal:     Right lower leg: No edema.     Left lower leg: No edema.  Feet:     Right foot:     Skin integrity: Skin integrity normal.     Left foot:     Skin integrity: Skin integrity normal.  Skin:    General: Skin is warm and dry.     Capillary Refill: Capillary refill takes less than 2 seconds.  Neurological:     General: No focal deficit present.     Mental Status: She is alert and oriented to person, place, and time.     Cranial Nerves: No cranial nerve deficit.     Sensory: No sensory deficit.     Motor: No weakness.     Coordination: Coordination normal.     Gait: Gait normal.  Psychiatric:        Attention and Perception: Attention and perception normal.        Mood and Affect: Mood and affect normal.        Speech: Speech normal.        Behavior: Behavior normal. Behavior is cooperative.        Thought Content: Thought content normal.        Cognition and Memory: Cognition and memory normal.        Judgment: Judgment normal.     Results for orders placed or performed in visit on 11/06/19  POCT glycosylated hemoglobin (Hb A1C)  Result Value Ref Range   Hemoglobin A1C 6.2 (A) 4.0 - 5.6 %   HbA1c POC (<> result, manual entry)     HbA1c, POC (prediabetic range)     HbA1c, POC (controlled diabetic range)    POCT Urinalysis Dipstick  Result Value Ref Range   Color, UA Yellow    Clarity, UA Clear    Glucose, UA Negative Negative   Bilirubin, UA negative    Ketones, UA negative    Spec Grav, UA 1.015 1.010 - 1.025   Blood, UA trace    pH, UA 5.0 5.0 - 8.0    Protein, UA Negative Negative   Urobilinogen, UA 0.2 0.2 or 1.0 E.U./dL   Nitrite, UA negative    Leukocytes, UA Negative Negative   Appearance     Odor  POCT UA - Microalbumin  Result Value Ref Range   Microalbumin Ur, POC 20 mg/L   Creatinine, POC     Albumin/Creatinine Ratio, Urine, POC        Assessment & Plan:   Problem List Items Addressed This Visit      Cardiovascular and Mediastinum   Essential hypertension    Stable and well controlled hypertension.  BP is at goal < 130/80.  Pt is working on lifestyle modifications.  Taking medications tolerating well without side effects. No known complications.  Plan: 1. Continue taking losartan 25mg  daily 2. Obtain labs after next visit in 6 months 3. Encouraged heart healthy diet and increasing exercise to 30 minutes most days of the week, going no more than 2 days in a row without exercise. 4. Check BP 1-2 x per week at home, keep log, and bring to clinic at next appointment. 5. Follow up 6 months.         Endocrine   Controlled type 2 diabetes mellitus without complication, without long-term current use of insulin ([place]) - Primary    Well-controlledDM with A1c 6.2 relatively unchanged from 05/07/2019 when A1C was 6.1% and goal A1c < 7.0%.  Plan:  1. Continue current therapy: diet and exercise controlled 2. Encourage improved lifestyle: - Increase physical activity to 30 minutes most days of the week.   3. Check fasting am CBG and log these.  Bring log to next visit for review 4. Continue ARB 5. DM Foot exam done today with no acute findings and advised to schedule DM ophtho exam, send record. 6. Follow-up 6 months      Relevant Orders   POCT glycosylated hemoglobin (Hb A1C) (Completed)   POCT Urinalysis Dipstick (Completed)   POCT UA - Microalbumin (Completed)   COMPLETE METABOLIC PANEL WITH GFR     Musculoskeletal and Integument   Nail fungus    Reports nail fungus on bilateral thumb nails that has waxed and  waned >10 years without improvement.  Reports started shortly after having her nails done at a salon.  Has tried topical creams without relief.  Plan: 1. CMP to be drawn today 2. Based on CMP results, can begin terbinafine, orally, for nail fungus.  Will send in prescriptions for 4 weeks at a time, with CMP labs drawn every 4 weeks for liver enzyme evaluation. 3. Will follow up in clinic in 3-6 months      Relevant Orders   COMPLETE METABOLIC PANEL WITH GFR     Other   Gassiness    Discussed keeping a food journal and for symptoms as well.  Will touch base in 2 weeks or so to review journal with symptoms to see if there is a food that can be contributing to her increase in gas symptoms.  Plan: 1. Begin food journal with symptoms 2. Follow up in 2 weeks or so to review journal       Other Visit Diagnoses    Screening for osteoporosis       Relevant Orders   DG Bone Density   Encounter for screening mammogram for malignant neoplasm of breast       Relevant Orders   MM 3D SCREEN BREAST BILATERAL   Screening for malignant neoplasm of colon       Relevant Orders   Ambulatory referral to Gastroenterology   Abnormal urinalysis       Relevant Orders   Urinalysis, microscopic only      No orders of the defined  types were placed in this encounter.   Follow up plan: Return in about 6 months (around 05/07/2020) for HTN, DM, A1C, Labs follow up.   Harlin Rain, [place] [facility] Nurse Practitioner [facility] 11/06/2019, 9:34 AM

## 2019-11-06 NOTE — Assessment & Plan Note (Signed)
Discussed keeping a food journal and for symptoms as well.  Will touch base in 2 weeks or so to review journal with symptoms to see if there is a food that can be contributing to her increase in gas symptoms.  Plan: 1. Begin food journal with symptoms 2. Follow up in 2 weeks or so to review journal

## 2019-11-06 NOTE — Patient Instructions (Addendum)
Have your lab drawn today and I will be in contact with the results and will send in the terbinafine for your nail fungus at that time.  We would plan to send in medication for 4 weeks at a time and repeat your lab work every 4 weeks to check your liver enzyme results.    Keep a food journal for the next 1-2 weeks and then contact me with this so we can review your symptoms and the foods you have consumed to see if there is any correlation with your recent increase in gas.  For Mammogram screening for breast cancer and DEXA Scan (Bone mineral density) screening for osteoporosis.  Please contact them to schedule for November 2021  Call the [place] below anytime to schedule your own appointment now that order has been placed.  [facility] [place], [place] [address] Phone: [phone]  [facility] [address] [place], [place] [address] Phone: [phone]  Advice on Protecting Your Feet   1. Daily foot inspections - Look for any breaks in the skin or areas of irritation such as blisters or red areas. Report to primary doctor or foot nurse immediately if any problems occur.  - If you have vision problems and cannot see your feet well or it is hard for you to reach your feet, ask a family member to inspect your feet daily.   2. Daily foot hygiene  - Wash feet daily, but do not soak your feet in hot water. If you do have callus, you may use warm water epsom salt foot soak and use moisturizer after. - Dry well especially between toes; Pat dry do not rub.  - If your skin is dry use a lotion to moisturize but never between the toes.  - If your skin is wet from perspiration, use an antifungal foot powder daily.   3. Shoes and Socks  - Wear a clean pair of socks daily.  - Make sure your shoes fit well and that you are measured and properly fit each time you purchase shoes. Your shoe size may change.  -  Powder your shoes with a small amount of an antifungal foot powder daily, as the shoe is the only article of clothing that is not laundered.  - Wear appropriate shoes and socks for the weather. It is especially important to protect your feet from the cold; however, don't forget about sunscreen to the tops of your feet in the hot sun.  - Wear well fitting shoes rather than slippers or flip-flops when walking or standing for long period of time.  - When at home make sure you always have protective foot wear on your feet.    Eat at least 3 meals and 1-2 snacks per day (don't skip breakfast).  Aim for no more than 5 hours between eating. - Tip: If you go >5 hours without eating and become very hungry, your body will supply it's own resources temporarily and you can gain extra weight when you eat.  The 5 Minute Rule of Exercise - Promise yourself to at least do 5 minutes of exercise (make sure you time it), and if at the end of 5 minutes (this is the hardest part of the work-out), if you still feel like you want stop (or not motivated to continue) then allow yourself to stop. Otherwise, more often than not you will feel encouraged that you can continue for a little while  longer or even more!  My 5 to Fitness!  5: fruits and vegetables per day (work on 9 per day if you are at 5) 4: exercise 4-5 times per week for at least 30 minutes (walking counts!) 3: meals per day (don't skip breakfast!), no more than 5 hours between meals 2: habits to quit -smoking -excess alcohol use (men >2 beer/day; women >1beer/day) 1: sweet per day (2 cookies, 1 small cup of ice cream, 12 oz soda)  These are general tips for healthy living. Try to start with 1 or 2 habit TODAY and make it a part of your life for several months. You set a goal today to work on:  Once you have 1 or 2 habits down for several months, try to begin working on your next healthy habit. With every single step you take, you will be leading a  healthier lifestyle!   Diet Recommendations for Diabetes   Starchy (carb) foods include: Bread, rice, pasta, potatoes, corn, crackers, bagels, muffins, all baked goods.   Protein foods include: Meat, fish, poultry, eggs, dairy foods, and beans such as pinto and kidney beans (beans also provide carbohydrate).   1. Eat at least 3 meals and 1-2 snacks per day. Never go more than 4-5 hours while awake without eating.   2. Limit starchy foods to TWO per meal and ONE per snack. ONE portion of a starchy  food is equal to the following:   - ONE slice of bread (or its equivalent, such as half of a hamburger bun).   - 1/2 cup of a "scoopable" starchy food such as potatoes or rice.   - 1 OUNCE (28 grams) of starchy snacks (crackers or pretzels, look on label).   - 15 grams of carbohydrate as shown on food label.   3. Both lunch and dinner should include a protein food, a carb food, and vegetables.   - Obtain twice as many veg's as protein or carbohydrate foods for both lunch and dinner.   - Try to keep frozen veg's on hand for a quick vegetable serving.     - Fresh or frozen veg's are best.   4. Breakfast should always include protein.    You can learn more information online about your diabetes at American Diabetes Association: http://www.diabetes.org/ - General self-care (diet, medications, blood sugar checks). - Diet recommendations - There are even recipes available for you to look at and try.  Try to get exercise a minimum of 30 minutes per day at least 5 days per week as well as  adequate water intake all while measuring blood pressure a few times per week.  Keep a blood pressure log and bring back to clinic at your next visit.  If your readings are consistently over 140/90 to contact our office/send me a MyChart message and we will see you sooner.  Can try DASH and Mediterranean diet options, avoiding processed foods, lowering sodium intake, avoiding pork products, and eating a plant based  diet for optimal health.  We will plan to see you back in 6 months for diabetes and hypertension follow up.  You will receive a survey after today's visit either digitally by e-mail or paper by [employer]. Your experiences and feedback matter to [place].  Please respond so we know how we are doing as we provide care for you.  Call us with any questions/concerns/needs.  It is my goal to be available to you for your health concerns.  Thanks for choosing me to  be a partner in your healthcare needs!  Harlin Rain, FNP-C Family Nurse Practitioner [facility] Phone: [phone]

## 2019-11-06 NOTE — Assessment & Plan Note (Signed)
Reports nail fungus on bilateral thumb nails that has waxed and waned >10 years without improvement.  Reports started shortly after having her nails done at a salon.  Has tried topical creams without relief.  Plan: 1. CMP to be drawn today 2. Based on CMP results, can begin terbinafine, orally, for nail fungus.  Will send in prescriptions for 4 weeks at a time, with CMP labs drawn every 4 weeks for liver enzyme evaluation. 3. Will follow up in clinic in 3-6 months

## 2019-11-07 ENCOUNTER — Other Ambulatory Visit: Payer: Self-pay | Admitting: Family Medicine

## 2019-11-07 DIAGNOSIS — B351 Tinea unguium: Secondary | ICD-10-CM

## 2019-11-07 LAB — COMPLETE METABOLIC PANEL WITH GFR
AG Ratio: 1.9 (calc) (ref 1.0–2.5)
ALT: 13 U/L (ref 6–29)
AST: 18 U/L (ref 10–35)
Albumin: 4.2 g/dL (ref 3.6–5.1)
Alkaline phosphatase (APISO): 57 U/L (ref 37–153)
BUN: 14 mg/dL (ref 7–25)
CO2: 26 mmol/L (ref 20–32)
Calcium: 9.7 mg/dL (ref 8.6–10.4)
Chloride: 107 mmol/L (ref 98–110)
Creat: 0.65 mg/dL (ref 0.60–0.93)
GFR, Est African American: 104 mL/min/{1.73_m2} (ref >=60)
GFR, Est Non African American: 89 mL/min/{1.73_m2} (ref >=60)
Globulin: 2.2 g/dL (calc) (ref 1.9–3.7)
Glucose, Bld: 101 mg/dL — ABNORMAL HIGH (ref 65–99)
Potassium: 4.3 mmol/L (ref 3.5–5.3)
Sodium: 141 mmol/L (ref 135–146)
Total Bilirubin: 0.4 mg/dL (ref 0.2–1.2)
Total Protein: 6.4 g/dL (ref 6.1–8.1)

## 2019-11-07 LAB — URINALYSIS, MICROSCOPIC ONLY
Bacteria, UA: NONE SEEN /HPF
Hyaline Cast: NONE SEEN /LPF
RBC / HPF: NONE SEEN /HPF (ref 0–2)
Squamous Epithelial / HPF: NONE SEEN /HPF (ref <=5)
WBC, UA: NONE SEEN /HPF (ref 0–5)

## 2019-11-07 MED ORDER — TERBINAFINE HCL 250 MG PO TABS: 250.0000 mg | ORAL_TABLET | Freq: Every day | ORAL | 0 refills | Status: DC

## 2019-11-14 ENCOUNTER — Telehealth (INDEPENDENT_AMBULATORY_CARE_PROVIDER_SITE_OTHER): Payer: Medicare PPO | Admitting: Gastroenterology

## 2019-11-14 DIAGNOSIS — K219 Gastro-esophageal reflux disease without esophagitis: Secondary | ICD-10-CM

## 2019-11-14 DIAGNOSIS — R140 Abdominal distension (gaseous): Secondary | ICD-10-CM

## 2019-11-14 DIAGNOSIS — Z86010 Personal history of colonic polyps: Secondary | ICD-10-CM

## 2019-11-14 NOTE — Progress Notes (Signed)
During this mornings colonoscopy triage, patient stated that she was experiencing the following GI Issues: Acid reflux , and gas.  Acid reflux has worsened since she had COVID in January.  She was taking Carafate which helped at the time.  Her PCP has not refilled this prescription. She is due for her Colonoscopy.  Personal history of colon polyps 2014 noted in her colonoscopy referral.  Virtual appt scheduled with Dr. Darene Lamer on Wed 12/25/19  Thanks,  Sharyn Lull, [place]

## 2019-11-20 DIAGNOSIS — M955 Acquired deformity of pelvis: Secondary | ICD-10-CM | POA: Diagnosis not present

## 2019-11-20 DIAGNOSIS — M5416 Radiculopathy, lumbar region: Secondary | ICD-10-CM | POA: Diagnosis not present

## 2019-11-20 DIAGNOSIS — M9903 Segmental and somatic dysfunction of lumbar region: Secondary | ICD-10-CM | POA: Diagnosis not present

## 2019-11-20 DIAGNOSIS — M9905 Segmental and somatic dysfunction of pelvic region: Secondary | ICD-10-CM | POA: Diagnosis not present

## 2019-11-27 ENCOUNTER — Telehealth: Payer: Self-pay

## 2019-11-27 DIAGNOSIS — B351 Tinea unguium: Secondary | ICD-10-CM

## 2019-11-27 NOTE — Telephone Encounter (Signed)
Copied from [place] [id number]. Topic: General - Other >> Nov 27, 2019 11:09 AM Rainey Pines A wrote: Patient would like a callback from nurse in regards to lab orders being placed for her follow up appointment. Please advise

## 2019-11-28 ENCOUNTER — Ambulatory Visit: Payer: Medicare Other | Admitting: Internal Medicine

## 2019-12-06 ENCOUNTER — Other Ambulatory Visit: Payer: Medicare PPO

## 2019-12-06 ENCOUNTER — Other Ambulatory Visit: Payer: Self-pay

## 2019-12-06 DIAGNOSIS — B351 Tinea unguium: Secondary | ICD-10-CM | POA: Diagnosis not present

## 2019-12-07 LAB — COMPREHENSIVE METABOLIC PANEL
AG Ratio: 2.1 (calc) (ref 1.0–2.5)
ALT: 16 U/L (ref 6–29)
AST: 17 U/L (ref 10–35)
Albumin: 4.4 g/dL (ref 3.6–5.1)
Alkaline phosphatase (APISO): 58 U/L (ref 37–153)
BUN: 13 mg/dL (ref 7–25)
CO2: 28 mmol/L (ref 20–32)
Calcium: 9.8 mg/dL (ref 8.6–10.4)
Chloride: 103 mmol/L (ref 98–110)
Creat: 0.65 mg/dL (ref 0.60–0.93)
Globulin: 2.1 g/dL (calc) (ref 1.9–3.7)
Glucose, Bld: 107 mg/dL — ABNORMAL HIGH (ref 65–99)
Potassium: 4.3 mmol/L (ref 3.5–5.3)
Sodium: 139 mmol/L (ref 135–146)
Total Bilirubin: 0.5 mg/dL (ref 0.2–1.2)
Total Protein: 6.5 g/dL (ref 6.1–8.1)

## 2019-12-09 ENCOUNTER — Other Ambulatory Visit: Payer: Self-pay | Admitting: Family Medicine

## 2019-12-09 DIAGNOSIS — B351 Tinea unguium: Secondary | ICD-10-CM

## 2019-12-09 DIAGNOSIS — Z79899 Other long term (current) drug therapy: Secondary | ICD-10-CM

## 2019-12-09 MED ORDER — TERBINAFINE HCL 250 MG PO TABS: 250.0000 mg | ORAL_TABLET | Freq: Every day | ORAL | 0 refills | Status: DC

## 2019-12-18 DIAGNOSIS — M9903 Segmental and somatic dysfunction of lumbar region: Secondary | ICD-10-CM | POA: Diagnosis not present

## 2019-12-18 DIAGNOSIS — M955 Acquired deformity of pelvis: Secondary | ICD-10-CM | POA: Diagnosis not present

## 2019-12-18 DIAGNOSIS — M5416 Radiculopathy, lumbar region: Secondary | ICD-10-CM | POA: Diagnosis not present

## 2019-12-18 DIAGNOSIS — M9905 Segmental and somatic dysfunction of pelvic region: Secondary | ICD-10-CM | POA: Diagnosis not present

## 2019-12-23 ENCOUNTER — Telehealth: Payer: Self-pay

## 2019-12-23 NOTE — Telephone Encounter (Signed)
The pt called requesting an appt to repeat CMP to check her liver function because of the medication she is currently taking.

## 2019-12-25 ENCOUNTER — Other Ambulatory Visit: Payer: Self-pay

## 2019-12-25 ENCOUNTER — Encounter: Payer: Self-pay | Admitting: Gastroenterology

## 2019-12-25 ENCOUNTER — Telehealth (INDEPENDENT_AMBULATORY_CARE_PROVIDER_SITE_OTHER): Payer: Medicare PPO | Admitting: Gastroenterology

## 2019-12-25 DIAGNOSIS — K219 Gastro-esophageal reflux disease without esophagitis: Secondary | ICD-10-CM

## 2019-12-25 DIAGNOSIS — R1310 Dysphagia, unspecified: Secondary | ICD-10-CM

## 2019-12-25 DIAGNOSIS — Z86010 Personal history of colonic polyps: Secondary | ICD-10-CM

## 2019-12-25 MED ORDER — OMEPRAZOLE 20 MG PO CPDR: 20.0000 mg | DELAYED_RELEASE_CAPSULE | Freq: Two times a day (BID) | ORAL | 0 refills | Status: DC

## 2019-12-25 NOTE — Addendum Note (Signed)
Addended by: Wayna Chalet on: 12/25/2019 12:28 PM   Modules accepted: Orders
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
## 2019-12-27 ENCOUNTER — Other Ambulatory Visit: Payer: Self-pay | Admitting: Nurse Practitioner

## 2019-12-27 DIAGNOSIS — I10 Essential (primary) hypertension: Secondary | ICD-10-CM

## 2020-01-02 ENCOUNTER — Other Ambulatory Visit: Payer: Medicare PPO

## 2020-01-02 DIAGNOSIS — B351 Tinea unguium: Secondary | ICD-10-CM | POA: Diagnosis not present

## 2020-01-02 DIAGNOSIS — Z79899 Other long term (current) drug therapy: Secondary | ICD-10-CM | POA: Diagnosis not present

## 2020-01-02 LAB — COMPLETE METABOLIC PANEL WITH GFR
AG Ratio: 2 (calc) (ref 1.0–2.5)
ALT: 14 U/L (ref 6–29)
AST: 18 U/L (ref 10–35)
Albumin: 4.4 g/dL (ref 3.6–5.1)
Alkaline phosphatase (APISO): 59 U/L (ref 37–153)
BUN: 12 mg/dL (ref 7–25)
CO2: 27 mmol/L (ref 20–32)
Calcium: 9.4 mg/dL (ref 8.6–10.4)
Chloride: 106 mmol/L (ref 98–110)
Creat: 0.7 mg/dL (ref 0.60–0.93)
GFR, Est African American: 101 mL/min/{1.73_m2} (ref >=60)
GFR, Est Non African American: 87 mL/min/{1.73_m2} (ref >=60)
Globulin: 2.2 g/dL (calc) (ref 1.9–3.7)
Glucose, Bld: 131 mg/dL — ABNORMAL HIGH (ref 65–99)
Potassium: 3.8 mmol/L (ref 3.5–5.3)
Sodium: 142 mmol/L (ref 135–146)
Total Bilirubin: 0.3 mg/dL (ref 0.2–1.2)
Total Protein: 6.6 g/dL (ref 6.1–8.1)

## 2020-01-03 ENCOUNTER — Other Ambulatory Visit: Payer: Self-pay | Admitting: Family Medicine

## 2020-01-03 DIAGNOSIS — B351 Tinea unguium: Secondary | ICD-10-CM

## 2020-01-03 MED ORDER — TERBINAFINE HCL 250 MG PO TABS: 250.0000 mg | ORAL_TABLET | Freq: Every day | ORAL | 0 refills | Status: DC

## 2020-01-15 DIAGNOSIS — M9905 Segmental and somatic dysfunction of pelvic region: Secondary | ICD-10-CM | POA: Diagnosis not present

## 2020-01-15 DIAGNOSIS — M955 Acquired deformity of pelvis: Secondary | ICD-10-CM | POA: Diagnosis not present

## 2020-01-15 DIAGNOSIS — M5416 Radiculopathy, lumbar region: Secondary | ICD-10-CM | POA: Diagnosis not present

## 2020-01-15 DIAGNOSIS — M9903 Segmental and somatic dysfunction of lumbar region: Secondary | ICD-10-CM | POA: Diagnosis not present

## 2020-01-28 ENCOUNTER — Telehealth: Payer: Self-pay | Admitting: Family Medicine

## 2020-01-28 ENCOUNTER — Encounter: Payer: Self-pay | Admitting: Gastroenterology

## 2020-01-28 ENCOUNTER — Other Ambulatory Visit: Payer: Self-pay

## 2020-01-28 NOTE — Telephone Encounter (Signed)
Has had 3 CMP for the terbinafine.  No need for additional labs for that.  Can see her back for follow up visit to recheck on toenail fungus if she would like.

## 2020-01-28 NOTE — Telephone Encounter (Signed)
The pt was notified and verbalize understanding. Appt schedule to f/u on Thursday, July 15th at 8:00am.

## 2020-01-28 NOTE — Telephone Encounter (Signed)
Pt wants FU from Nicole's nurse. Pt states that it is time for her blood work ups and that she would like whatever [place] request to get request on file and she will FU with appt. Pls call pt back at [phone] Pt was als o having connection problem with her phone.

## 2020-01-30 ENCOUNTER — Encounter: Payer: Self-pay | Admitting: Family Medicine

## 2020-01-30 ENCOUNTER — Other Ambulatory Visit: Payer: Medicare PPO

## 2020-01-30 ENCOUNTER — Other Ambulatory Visit
Admission: RE | Admit: 2020-01-30 | Discharge: 2020-01-30 | Disposition: A | Discharge status: Home / Self Care | Payer: Medicare PPO | Source: Ambulatory Visit | Attending: Gastroenterology | Admitting: Gastroenterology

## 2020-01-30 ENCOUNTER — Other Ambulatory Visit: Payer: Self-pay

## 2020-01-30 ENCOUNTER — Ambulatory Visit: Payer: Medicare PPO | Admitting: Family Medicine

## 2020-01-30 VITALS — BP 133/60 | HR 87 | Temp 97.1°F | Resp 16 | Ht 61.0 in | Wt 184.6 lb

## 2020-01-30 DIAGNOSIS — Z01812 Encounter for preprocedural laboratory examination: Secondary | ICD-10-CM | POA: Insufficient documentation

## 2020-01-30 DIAGNOSIS — B351 Tinea unguium: Secondary | ICD-10-CM | POA: Diagnosis not present

## 2020-01-30 DIAGNOSIS — Z20822 Contact with and (suspected) exposure to covid-19: Secondary | ICD-10-CM | POA: Insufficient documentation

## 2020-01-30 LAB — SARS CORONAVIRUS 2 (TAT 6-24 HRS): SARS Coronavirus 2: NEGATIVE

## 2020-01-30 NOTE — Patient Instructions (Signed)
As we discussed, your nails are growing out well.  Once the fungus has grown out completely, to throw out all of your nail clippers, nail files, nail polish and get a new set to prevent reinfection.  We will plan to see you back as needed for this  You will receive a survey after today's visit either digitally by e-mail or paper by [place] mail. Your experiences and feedback matter to [place].  Please respond so we know how we are doing as we provide care for you.  Call us with any questions/concerns/needs.  It is my goal to be available to you for your health concerns.  Thanks for choosing me to be a partner in your healthcare needs!  Harlin Rain, FNP-C Family Nurse Practitioner [facility] Phone: [phone]

## 2020-01-30 NOTE — Progress Notes (Signed)
Subjective:    Patient ID: Mary Burke, female    DOB: [birth year], 74 y.o.   MRN: [id number]  Mary Burke is a 74 y.o. female presenting on 01/30/2020 for Nail Problem (fingernail fungus. Pt notice moderate improvement, but still a small area that she is concern about. )   HPI  Mary Burke presents to clinic for follow up visit on her nail fungus.  Reports completed all 3 months of terbinafine and is beginning to see improvement at the bases of her nails.  Is concerned that the entire nail hasn't cleared up yet.  Denies any acute concerns today.  Depression screen [facility] 2/9 08/27/2019 08/21/2018 08/15/2017  Decreased Interest 0 0 0  Down, Depressed, Hopeless 0 0 0  PHQ - 2 Score 0 0 0    Social History   Tobacco Use  . Smoking status: Never Smoker  . Smokeless tobacco: Never Used  Vaping Use  . Vaping Use: Never used  Substance Use Topics  . Alcohol use: No  . Drug use: No    Review of Systems  Constitutional: Negative.   HENT: Negative.   Eyes: Negative.   Respiratory: Negative.   Cardiovascular: Negative.   Gastrointestinal: Negative.   Endocrine: Negative.   Genitourinary: Negative.   Musculoskeletal: Negative.   Skin: Negative.        Nail fungus  Allergic/Immunologic: Negative.   Neurological: Negative.   Hematological: Negative.   Psychiatric/Behavioral: Negative.    Per HPI unless specifically indicated above     Objective:    BP 133/60 (BP Location: Right Arm, Patient Position: Sitting, Cuff Size: Normal)   Pulse 87   Temp (!) 97.1 F (36.2 C) (Temporal)   Resp 16   Ht 5\' 1"  (1.549 m)   Wt 184 lb 9.6 oz (83.7 kg)   SpO2 99%   BMI 34.88 kg/m   Wt Readings from Last 3 Encounters:  01/30/20 184 lb 9.6 oz (83.7 kg)  11/06/19 191 lb 3.2 oz (86.7 kg)  08/27/19 187 lb (84.8 kg)    Physical Exam Vitals reviewed.  Constitutional:      General: She is not in acute distress.    Appearance: Normal appearance. She is well-developed and  well-groomed. She is obese. She is not ill-appearing or toxic-appearing.  HENT:     Head: Normocephalic and atraumatic.     Nose:     Comments: Mary Burke is in place, covering mouth and nose. Eyes:     General: Lids are normal. Vision grossly intact.        Right eye: No discharge.        Left eye: No discharge.     Extraocular Movements: Extraocular movements intact.     Conjunctiva/sclera: Conjunctivae normal.     Pupils: Pupils are equal, round, and reactive to light.  Cardiovascular:     Rate and Rhythm: Normal rate and regular rhythm.     Pulses: Normal pulses.          Dorsalis pedis pulses are 2+ on the right side and 2+ on the left side.     Heart sounds: Normal heart sounds. No murmur heard.  No friction rub. No gallop.   Pulmonary:     Effort: Pulmonary effort is normal. No respiratory distress.     Breath sounds: Normal breath sounds.  Musculoskeletal:     Right lower leg: No edema.     Left lower leg: No edema.  Skin:    General: Skin is warm  and dry.     Capillary Refill: Capillary refill takes less than 2 seconds.     Comments: Bilateral thumbs with resolving nail fungus.  Approx 1/4 new growth at bases of nail with symptom resolution.  All other finger nails with resolution of nail fungus.  Neurological:     General: No focal deficit present.     Mental Status: She is alert and oriented to person, place, and time.  Psychiatric:        Attention and Perception: Attention and perception normal.        Mood and Affect: Mood and affect normal.        Speech: Speech normal.        Behavior: Behavior normal. Behavior is cooperative.        Thought Content: Thought content normal.        Cognition and Memory: Cognition and memory normal.        Judgment: Judgment normal.    Results for orders placed or performed in visit on 12/09/19  COMPLETE METABOLIC PANEL WITH GFR  Result Value Ref Range   Glucose, Bld 131 (H) 65 - 99 mg/dL   BUN 12 7 - 25 mg/dL   Creat 0.70  0.60 - 0.93 mg/dL   GFR, Est Non African American 87 > OR = 60 mL/min/1.53m2   GFR, Est African American 101 > OR = 60 mL/min/1.59m2   BUN/Creatinine Ratio NOT APPLICABLE 6 - 22 (calc)   Sodium 142 135 - 146 mmol/L   Potassium 3.8 3.5 - 5.3 mmol/L   Chloride 106 98 - 110 mmol/L   CO2 27 20 - 32 mmol/L   Calcium 9.4 8.6 - 10.4 mg/dL   Total Protein 6.6 6.1 - 8.1 g/dL   Albumin 4.4 3.6 - 5.1 g/dL   Globulin 2.2 1.9 - 3.7 g/dL (calc)   AG Ratio 2.0 1.0 - 2.5 (calc)   Total Bilirubin 0.3 0.2 - 1.2 mg/dL   Alkaline phosphatase (APISO) 59 37 - 153 U/L   AST 18 10 - 35 U/L   ALT 14 6 - 29 U/L      Assessment & Plan:   Problem List Items Addressed This Visit      Musculoskeletal and Integument   Nail fungus - Primary    Improvement of nail growth at all bases.  All nails except for thumbs have had complete resolution.  Thumb nails approximately 3/4 grown out.  Discussed will need the full nail to grow out for the fungus to resolve in those fingernails.    Plan: 1. Continue to allow nails to grow out and once they have, get rid of all nail equipment and polishes to prevent reinfection. 2. Follow up PRN         No orders of the defined types were placed in this encounter.     Follow up plan: Return if symptoms worsen or fail to improve.   Harlin Rain, [place] Family Nurse Practitioner [facility] 01/30/2020, 8:32 AM

## 2020-01-30 NOTE — Assessment & Plan Note (Signed)
Improvement of nail growth at all bases.  All nails except for thumbs have had complete resolution.  Thumb nails approximately 3/4 grown out.  Discussed will need the full nail to grow out for the fungus to resolve in those fingernails.    Plan: 1. Continue to allow nails to grow out and once they have, get rid of all nail equipment and polishes to prevent reinfection. 2. Follow up PRN

## 2020-01-31 NOTE — Discharge Instructions (Signed)
General Anesthesia, Adult, Care After This sheet gives you information about how to care for yourself after your procedure. Your health care provider may also give you more specific instructions. If you have problems or questions, contact your health care provider. What can I expect after the procedure? After the procedure, the following side effects are common:  Pain or discomfort at the IV site.  Nausea.  Vomiting.  Sore throat.  Trouble concentrating.  Feeling cold or chills.  Weak or tired.  Sleepiness and fatigue.  Soreness and body aches. These side effects can affect parts of the body that were not involved in surgery. Follow these instructions at home:  For at least 24 hours after the procedure:  Have a responsible adult stay with you. It is important to have someone help care for you until you are awake and alert.  Rest as needed.  Do not: ? Participate in activities in which you could fall or become injured. ? Drive. ? Use heavy machinery. ? Drink alcohol. ? Take sleeping pills or medicines that cause drowsiness. ? Make important decisions or sign legal documents. ? Take care of children on your own. Eating and drinking  Follow any instructions from your health care provider about eating or drinking restrictions.  When you feel hungry, start by eating small amounts of foods that are soft and easy to digest (bland), such as toast. Gradually return to your regular diet.  Drink enough fluid to keep your urine pale yellow.  If you vomit, rehydrate by drinking water, juice, or clear broth. General instructions  If you have sleep apnea, surgery and certain medicines can increase your risk for breathing problems. Follow instructions from your health care provider about wearing your sleep device: ? Anytime you are sleeping, including during daytime naps. ? While taking prescription pain medicines, sleeping medicines, or medicines that make you drowsy.  Return to  your normal activities as told by your health care provider. Ask your health care provider what activities are safe for you.  Take over-the-counter and prescription medicines only as told by your health care provider.  If you smoke, do not smoke without supervision.  Keep all follow-up visits as told by your health care provider. This is important. Contact a health care provider if:  You have nausea or vomiting that does not get better with medicine.  You cannot eat or drink without vomiting.  You have pain that does not get better with medicine.  You are unable to pass urine.  You develop a skin rash.  You have a fever.  You have redness around your IV site that gets worse. Get help right away if:  You have difficulty breathing.  You have chest pain.  You have blood in your urine or stool, or you vomit blood. Summary  After the procedure, it is common to have a sore throat or nausea. It is also common to feel tired.  Have a responsible adult stay with you for the first 24 hours after general anesthesia. It is important to have someone help care for you until you are awake and alert.  When you feel hungry, start by eating small amounts of foods that are soft and easy to digest (bland), such as toast. Gradually return to your regular diet.  Drink enough fluid to keep your urine pale yellow.  Return to your normal activities as told by your health care provider. Ask your health care provider what activities are safe for you. This information is not   intended to replace advice given to you by your health care provider. Make sure you discuss any questions you have with your health care provider. Document Revised: 07/07/2017 Document Reviewed: 02/17/2017 Elsevier Patient Education  2020 Elsevier Inc.  

## 2020-02-03 ENCOUNTER — Other Ambulatory Visit: Payer: Self-pay | Admitting: Gastroenterology

## 2020-02-03 ENCOUNTER — Encounter: Payer: Self-pay | Admitting: Gastroenterology

## 2020-02-03 ENCOUNTER — Ambulatory Visit: Payer: Medicare PPO | Admitting: Anesthesiology

## 2020-02-03 ENCOUNTER — Other Ambulatory Visit: Payer: Self-pay

## 2020-02-03 ENCOUNTER — Ambulatory Visit
Admission: RE | Admit: 2020-02-03 | Discharge: 2020-02-03 | Disposition: A | Discharge status: Home / Self Care | Payer: Medicare PPO | Attending: Gastroenterology | Admitting: Gastroenterology

## 2020-02-03 ENCOUNTER — Encounter
Admission: RE | Disposition: A | Discharge status: Home / Self Care | Payer: Self-pay | Source: Home / Self Care | Attending: Gastroenterology

## 2020-02-03 DIAGNOSIS — K635 Polyp of colon: Secondary | ICD-10-CM

## 2020-02-03 DIAGNOSIS — Z1211 Encounter for screening for malignant neoplasm of colon: Secondary | ICD-10-CM | POA: Diagnosis not present

## 2020-02-03 DIAGNOSIS — Z86010 Personal history of colonic polyps: Secondary | ICD-10-CM

## 2020-02-03 DIAGNOSIS — D121 Benign neoplasm of appendix: Secondary | ICD-10-CM | POA: Diagnosis not present

## 2020-02-03 DIAGNOSIS — D124 Benign neoplasm of descending colon: Secondary | ICD-10-CM | POA: Insufficient documentation

## 2020-02-03 DIAGNOSIS — K319 Disease of stomach and duodenum, unspecified: Secondary | ICD-10-CM | POA: Diagnosis not present

## 2020-02-03 DIAGNOSIS — Z7951 Long term (current) use of inhaled steroids: Secondary | ICD-10-CM | POA: Diagnosis not present

## 2020-02-03 DIAGNOSIS — Z79899 Other long term (current) drug therapy: Secondary | ICD-10-CM | POA: Insufficient documentation

## 2020-02-03 DIAGNOSIS — Z800 Family history of malignant neoplasm of digestive organs: Secondary | ICD-10-CM | POA: Diagnosis not present

## 2020-02-03 DIAGNOSIS — E119 Type 2 diabetes mellitus without complications: Secondary | ICD-10-CM | POA: Insufficient documentation

## 2020-02-03 DIAGNOSIS — R1310 Dysphagia, unspecified: Secondary | ICD-10-CM

## 2020-02-03 DIAGNOSIS — K222 Esophageal obstruction: Secondary | ICD-10-CM

## 2020-02-03 DIAGNOSIS — Z7982 Long term (current) use of aspirin: Secondary | ICD-10-CM | POA: Insufficient documentation

## 2020-02-03 DIAGNOSIS — R12 Heartburn: Secondary | ICD-10-CM | POA: Diagnosis not present

## 2020-02-03 DIAGNOSIS — K3189 Other diseases of stomach and duodenum: Secondary | ICD-10-CM | POA: Diagnosis not present

## 2020-02-03 DIAGNOSIS — I10 Essential (primary) hypertension: Secondary | ICD-10-CM | POA: Insufficient documentation

## 2020-02-03 DIAGNOSIS — K449 Diaphragmatic hernia without obstruction or gangrene: Secondary | ICD-10-CM | POA: Insufficient documentation

## 2020-02-03 DIAGNOSIS — D860 Sarcoidosis of lung: Secondary | ICD-10-CM | POA: Diagnosis not present

## 2020-02-03 DIAGNOSIS — K5730 Diverticulosis of large intestine without perforation or abscess without bleeding: Secondary | ICD-10-CM | POA: Diagnosis not present

## 2020-02-03 DIAGNOSIS — Z8616 Personal history of COVID-19: Secondary | ICD-10-CM | POA: Insufficient documentation

## 2020-02-03 DIAGNOSIS — K219 Gastro-esophageal reflux disease without esophagitis: Secondary | ICD-10-CM | POA: Diagnosis not present

## 2020-02-03 DIAGNOSIS — K228 Other specified diseases of esophagus: Secondary | ICD-10-CM | POA: Insufficient documentation

## 2020-02-03 DIAGNOSIS — D122 Benign neoplasm of ascending colon: Secondary | ICD-10-CM | POA: Diagnosis not present

## 2020-02-03 DIAGNOSIS — J45909 Unspecified asthma, uncomplicated: Secondary | ICD-10-CM | POA: Diagnosis not present

## 2020-02-03 DIAGNOSIS — B3781 Candidal esophagitis: Secondary | ICD-10-CM | POA: Diagnosis not present

## 2020-02-03 HISTORY — PX: ESOPHAGOGASTRODUODENOSCOPY (EGD) WITH PROPOFOL: SHX5813

## 2020-02-03 HISTORY — PX: COLONOSCOPY WITH PROPOFOL: SHX5780

## 2020-02-03 HISTORY — PX: POLYPECTOMY: SHX5525

## 2020-02-03 SURGERY — ESOPHAGOGASTRODUODENOSCOPY (EGD) WITH PROPOFOL
Anesthesia: General | Site: Rectum

## 2020-02-03 MED ORDER — ACETAMINOPHEN 10 MG/ML IV SOLN: 1000.0000 mg | Freq: Once | INTRAVENOUS | Status: DC | PRN

## 2020-02-03 MED ORDER — LIDOCAINE HCL (CARDIAC) PF 100 MG/5ML IV SOSY
PREFILLED_SYRINGE | INTRAVENOUS | Status: DC | PRN
  Administered 2020-02-03: 30 mg via INTRAVENOUS

## 2020-02-03 MED ORDER — SODIUM CHLORIDE 0.9 % IV SOLN: INTRAVENOUS | Status: DC

## 2020-02-03 MED ORDER — GLYCOPYRROLATE 0.2 MG/ML IJ SOLN
INTRAMUSCULAR | Status: DC | PRN
  Administered 2020-02-03: .1 mg via INTRAVENOUS

## 2020-02-03 MED ORDER — PROPOFOL 10 MG/ML IV BOLUS
INTRAVENOUS | Status: DC | PRN
  Administered 2020-02-03: 20 mg via INTRAVENOUS
  Administered 2020-02-03: 40 mg via INTRAVENOUS
  Administered 2020-02-03: 100 mg via INTRAVENOUS
  Administered 2020-02-03 (×2): 20 mg via INTRAVENOUS
  Administered 2020-02-03: 40 mg via INTRAVENOUS
  Administered 2020-02-03: 50 mg via INTRAVENOUS
  Administered 2020-02-03: 20 mg via INTRAVENOUS
  Administered 2020-02-03 (×2): 40 mg via INTRAVENOUS
  Administered 2020-02-03 (×2): 20 mg via INTRAVENOUS
  Administered 2020-02-03: 40 mg via INTRAVENOUS
  Administered 2020-02-03: 50 mg via INTRAVENOUS
  Administered 2020-02-03 (×2): 40 mg via INTRAVENOUS

## 2020-02-03 MED ORDER — STERILE WATER FOR IRRIGATION IR SOLN: Status: DC | PRN

## 2020-02-03 MED ORDER — ONDANSETRON HCL 4 MG/2ML IJ SOLN
4.0000 mg | Freq: Once | INTRAMUSCULAR | Status: AC | PRN
  Administered 2020-02-03: 4 mg via INTRAVENOUS

## 2020-02-03 MED ORDER — LACTATED RINGERS IV SOLN: INTRAVENOUS | Status: DC

## 2020-02-03 SURGICAL SUPPLY — 11 items
BLOCK BITE 60FR ADLT L/F GRN (MISCELLANEOUS) ×4 IMPLANT
FORCEPS BIOP RAD 4 LRG CAP 4 (CUTTING FORCEPS) ×4 IMPLANT
GOWN CVR UNV OPN BCK APRN NK (MISCELLANEOUS) ×4 IMPLANT
GOWN ISOL THUMB LOOP REG UNIV (MISCELLANEOUS) ×4
KIT ENDO PROCEDURE OLY (KITS) ×4 IMPLANT
MANIFOLD NEPTUNE II (INSTRUMENTS) ×4 IMPLANT
SNARE COLD EXACTO (MISCELLANEOUS) ×4 IMPLANT
SYR INFLATION 60ML (SYRINGE) ×4 IMPLANT
TRAP ETRAP POLY (MISCELLANEOUS) ×4 IMPLANT
WATER STERILE IRR 250ML POUR (IV SOLUTION) ×4 IMPLANT
WIRE CRE 18-20MM 8CM F G (MISCELLANEOUS) ×4 IMPLANT

## 2020-02-03 NOTE — Anesthesia Preprocedure Evaluation (Addendum)
Anesthesia Evaluation  Patient identified by MRN, date of birth, ID band Patient awake    Reviewed: Allergy & Precautions, NPO status , Patient's Chart, lab work & pertinent test results, reviewed documented beta blocker date and time   History of Anesthesia Complications Negative for: history of anesthetic complications  Airway Mallampati: III  TM Distance: <3 FB Neck ROM: Full    Dental   Pulmonary asthma ,   COVID+ 08/13/19 Pulmonary sarcoidosis   breath sounds clear to auscultation       Cardiovascular hypertension, (-) angina(-) DOE  Rhythm:Regular Rate:Normal   HLD   Neuro/Psych  Headaches,    GI/Hepatic GERD  Controlled,  Endo/Other  diabetes  Renal/GU      Musculoskeletal  (+) Arthritis ,   Abdominal (+) + obese (BMI 35),   Peds  Hematology   Anesthesia Other Findings   Reproductive/Obstetrics                            Anesthesia Physical Anesthesia Plan  ASA: III  Anesthesia Plan: General   Post-op Pain Management:    Induction: Intravenous  PONV Risk Score and Plan: 3 and Propofol infusion, TIVA and Treatment may vary due to age or medical condition  Airway Management Planned: Natural Airway and Nasal Cannula  Additional Equipment:   Intra-op Plan:   Post-operative Plan:   Informed Consent: I have reviewed the patients History and Physical, chart, labs and discussed the procedure including the risks, benefits and alternatives for the proposed anesthesia with the patient or authorized representative who has indicated his/her understanding and acceptance.       Plan Discussed with: CRNA and Anesthesiologist  Anesthesia Plan Comments:         Anesthesia Quick Evaluation

## 2020-02-03 NOTE — Op Note (Signed)
[facility] Patient Name: Mary Burke Procedure Date: 02/03/2020 2:21 PM MRN: [id number] Account #: [id number] Date of Birth: 28-Nov-1945 Admit Type: Outpatient Age: 74 Room: [facility] OR ROOM 01 Gender: Female Note Status: Finalized Procedure:             Upper GI endoscopy Indications:           Dysphagia, Heartburn Providers:             Alick Lecomte B. Bonna Gains MD, MD Referring MD:          [name] (Referring MD) Medicines:             Monitored Anesthesia Care Complications:         No immediate complications. Procedure:             Pre-Anesthesia Assessment:                        - Prior to the procedure, a History and Physical was                         performed, and patient medications, allergies and                         sensitivities were reviewed. The patient's tolerance                         of previous anesthesia was reviewed.                        - The risks and benefits of the procedure and the                         sedation options and risks were discussed with the                         patient. All questions were answered and informed                         consent was obtained.                        - Patient identification and proposed procedure were                         verified prior to the procedure by the physician, the                         nurse, the anesthesiologist, the anesthetist and the                         technician. The procedure was verified in the                         procedure room.                        - ASA Grade Assessment: II - A patient with mild                         systemic disease.  After obtaining informed consent, the endoscope was                         passed under direct vision. Throughout the procedure,                         the patient's blood pressure, pulse, and oxygen                         saturations were monitored continuously.  The was                         introduced through the mouth, and advanced to the                         second part of duodenum. The upper GI endoscopy was                         accomplished with ease. The patient tolerated the                         procedure well. Findings:      One benign-appearing, intrinsic mild stenosis was found at the       gastroesophageal junction. The stenosis was traversed. A TTS dilator was       passed through the scope. Dilation with an 18-19-20 mm balloon dilator       was performed to 20 mm. The dilation site was examined and showed       complete resolution of luminal narrowing.      White nummular lesions were noted in the mid esophagus. Biopsies were       obtained from the proximal and distal esophagus with cold forceps for       histology of suspected eosinophilic esophagitis. Brushings for KOH prep       were obtained.      Patchy mildly erythematous mucosa without bleeding was found in the       gastric antrum. Biopsies were taken with a cold forceps for histology.       Biopsies were obtained in the gastric body, at the incisura and in the       gastric antrum with cold forceps for histology.      A medium-sized hiatal hernia was present.      The exam of the stomach was otherwise normal.      The duodenal bulb, second portion of the duodenum and examined duodenum       were normal. Impression:            - Benign-appearing esophageal stenosis. Dilated.                        - White nummular lesions in esophageal mucosa.                         Biopsied. Brushings performed.                        - Erythematous mucosa in the antrum. Biopsied.                        - Medium-sized hiatal hernia.                        -  Normal duodenal bulb, second portion of the duodenum                         and examined duodenum.                        - Biopsies were obtained in the gastric body, at the                         incisura and in the  gastric antrum. Recommendation:        - Await pathology results.                        - Follow an antireflux regimen.                        - Discharge patient to home (with escort).                        - Advance diet as tolerated.                        - Continue present medications.                        - Patient has a contact number available for                         emergencies. The signs and symptoms of potential                         delayed complications were discussed with the patient.                         Return to normal activities tomorrow. Written                         discharge instructions were provided to the patient.                        - Discharge patient to home (with escort).                        - The findings and recommendations were discussed with                         the patient.                        - The findings and recommendations were discussed with                         the patient's family.                        - Return to my office in 2 weeks. Procedure Code(s):     --- Professional ---                        [phone], Esophagogastroduodenoscopy, flexible,  transoral; with transendoscopic balloon dilation of                         esophagus (less than 30 mm diameter)                        43239, 59, Esophagogastroduodenoscopy, flexible,                         transoral; with biopsy, single or multiple Diagnosis Code(s):     --- Professional ---                        K22.2, Esophageal obstruction                        K22.8, Other specified diseases of esophagus                        K31.89, Other diseases of stomach and duodenum                        R13.10, Dysphagia, unspecified                        R12, Heartburn CPT copyright 2019 American Medical Association. All rights reserved. The codes documented in this report are preliminary and upon coder review may  be revised to meet current  compliance requirements.  [name], MD [name] MD, MD 02/03/2020 2:50:33 PM This report has been signed electronically. Number of Addenda: 0 Note Initiated On: 02/03/2020 2:21 PM Total Procedure Duration: 0 hours 15 minutes 47 seconds  Estimated Blood Loss:  Estimated blood loss: none.      [facility]

## 2020-02-03 NOTE — Anesthesia Procedure Notes (Signed)
Date/Time: 02/03/2020 2:26 PM Performed by: Cameron Ali, CRNA Pre-anesthesia Checklist: Patient identified, Emergency Drugs available, Suction available, Timeout performed and Patient being monitored Patient Re-evaluated:Patient Re-evaluated prior to induction Oxygen Delivery Method: Nasal cannula Placement Confirmation: positive ETCO2

## 2020-02-03 NOTE — Op Note (Addendum)
[facility] Patient Name: Mary Burke Procedure Date: 02/03/2020 2:21 PM MRN: [id number] Account #: [id number] Date of Birth: 1945/08/23 Admit Type: Outpatient Age: 74 Room: [facility] OR ROOM 01 Gender: Female Note Status: Finalized Procedure:             Colonoscopy Indications:           Screening in patient at increased risk: Family history                         of 1st-degree relative with colorectal cancer Providers:             Rettie Laird B. Bonna Gains MD, MD Medicines:             Monitored Anesthesia Care Complications:         No immediate complications. Procedure:             Pre-Anesthesia Assessment:                        - ASA Grade Assessment: II - A patient with mild                         systemic disease.                        - Prior to the procedure, a History and Physical was                         performed, and patient medications, allergies and                         sensitivities were reviewed. The patient's tolerance                         of previous anesthesia was reviewed.                        - The risks and benefits of the procedure and the                         sedation options and risks were discussed with the                         patient. All questions were answered and informed                         consent was obtained.                        - Patient identification and proposed procedure were                         verified prior to the procedure by the physician, the                         nurse, the anesthesiologist, the anesthetist and the                         technician. The procedure was verified in the  procedure room.                        After obtaining informed consent, the colonoscope was                         passed under direct vision. Throughout the procedure,                         the patient's blood pressure, pulse, and oxygen                          saturations were monitored continuously. The was                         introduced through the anus and advanced to the the                         cecum, identified by appendiceal orifice and ileocecal                         valve. The colonoscopy was performed with ease. The                         patient tolerated the procedure well. The quality of                         the bowel preparation was good. Findings:      The perianal and digital rectal examinations were normal.      Five sessile polyps were found in the ascending colon. The polyps were 3       to 4 mm in size. These polyps were removed with a cold biopsy forceps.       Resection and retrieval were complete.      A 6 mm polyp was found in the descending colon. The polyp was sessile.       The polyp was removed with a cold snare. Resection and retrieval were       complete.      Multiple diverticula were found in the sigmoid colon.      The exam was otherwise without abnormality.      The rectum, sigmoid colon, descending colon, transverse colon, ascending       colon and cecum appeared normal.      The retroflexed view of the distal rectum and anal verge was normal and       showed no anal or rectal abnormalities. Impression:            - Five 3 to 4 mm polyps in the ascending colon,                         removed with a cold biopsy forceps. Resected and                         retrieved.                        - One 6 mm polyp in the descending colon, removed with  a cold snare. Resected and retrieved.                        - Diverticulosis in the sigmoid colon.                        - The examination was otherwise normal.                        - The rectum, sigmoid colon, descending colon,                         transverse colon, ascending colon and cecum are normal.                        - The distal rectum and anal verge are normal on                         retroflexion  view. Recommendation:        - Discharge patient to home (with escort).                        - High fiber diet.                        - Advance diet as tolerated.                        - Continue present medications.                        - Await pathology results.                        - Repeat colonoscopy date to be determined after                         pending pathology results are reviewed.                        - The findings and recommendations were discussed with                         the patient.                        - The findings and recommendations were discussed with                         the patient's family.                        - Return to primary care physician as previously                         scheduled. Procedure Code(s):     --- Professional ---                        [id number], Colonoscopy, flexible; with removal of  tumor(s), polyp(s), or other lesion(s) by snare                         technique                        45380, 59, Colonoscopy, flexible; with biopsy, single                         or multiple Diagnosis Code(s):     --- Professional ---                        K63.5, Polyp of colon                        Z80.0, Family history of malignant neoplasm of                         digestive organs CPT copyright 2019 American Medical Association. All rights reserved. The codes documented in this report are preliminary and upon coder review may  be revised to meet current compliance requirements.  [name], MD [name] MD, MD 02/03/2020 3:22:28 PM This report has been signed electronically. Number of Addenda: 0 Note Initiated On: 02/03/2020 2:21 PM Scope Withdrawal Time: 0 hours 17 minutes 15 seconds  Total Procedure Duration: 0 hours 26 minutes 36 seconds  Estimated Blood Loss:  Estimated blood loss: none.      [facility]
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
## 2020-02-03 NOTE — Transfer of Care (Signed)
Immediate Anesthesia Transfer of Care Note  Patient: Mary Burke  Procedure(s) Performed: ESOPHAGOGASTRODUODENOSCOPY (EGD) WITH BIOPSY and DILATION (N/A Mouth) COLONOSCOPY WITH BIOPSY (N/A Rectum) POLYPECTOMY (N/A Rectum)  Patient Location: PACU  Anesthesia Type: General  Level of Consciousness: awake, alert  and patient cooperative  Airway and Oxygen Therapy: Patient Spontanous Breathing and Patient connected to supplemental oxygen  Post-op Assessment: Post-op Vital signs reviewed, Patient's Cardiovascular Status Stable, Respiratory Function Stable, Patent Airway and No signs of Nausea or vomiting  Post-op Vital Signs: Reviewed and stable  Complications: No complications documented.

## 2020-02-03 NOTE — Anesthesia Postprocedure Evaluation (Signed)
Anesthesia Post Note  Patient: Mary Burke  Procedure(s) Performed: ESOPHAGOGASTRODUODENOSCOPY (EGD) WITH BIOPSY and DILATION (N/A Mouth) COLONOSCOPY WITH BIOPSY (N/A Rectum) POLYPECTOMY (N/A Rectum)     Patient location during evaluation: PACU Anesthesia Type: General Level of consciousness: awake and alert Pain management: pain level controlled Vital Signs Assessment: post-procedure vital signs reviewed and stable Respiratory status: spontaneous breathing, nonlabored ventilation, respiratory function stable and patient connected to nasal cannula oxygen Cardiovascular status: blood pressure returned to baseline and stable Postop Assessment: no apparent nausea or vomiting Anesthetic complications: no   No complications documented.  Sianni Cloninger A  Cem Kosman

## 2020-02-04 ENCOUNTER — Encounter: Payer: Self-pay | Admitting: Gastroenterology

## 2020-02-05 LAB — SURGICAL PATHOLOGY

## 2020-02-05 LAB — CYTOLOGY - NON PAP

## 2020-02-06 ENCOUNTER — Other Ambulatory Visit: Payer: Self-pay | Admitting: Gastroenterology

## 2020-02-06 ENCOUNTER — Telehealth: Payer: Self-pay

## 2020-02-06 LAB — PATHOLOGY

## 2020-02-06 MED ORDER — FLUCONAZOLE 200 MG PO TABS: ORAL_TABLET | ORAL | 0 refills | Status: AC

## 2020-02-06 NOTE — Telephone Encounter (Signed)
Called patient to let her know about her pathology results and what Dr. Bonna Gains recommended for her. Patient agreed and stated that she would pick up her prescription.

## 2020-02-06 NOTE — Telephone Encounter (Signed)
-----   Message from Virgel Manifold, MD sent at 02/06/2020  9:45 AM EDT ----- [name] please let the patient know, [facility] esophagus showed yeast.  I have sent an antifungal over to [facility] pharmacy.

## 2020-02-12 DIAGNOSIS — M9903 Segmental and somatic dysfunction of lumbar region: Secondary | ICD-10-CM | POA: Diagnosis not present

## 2020-02-12 DIAGNOSIS — M9905 Segmental and somatic dysfunction of pelvic region: Secondary | ICD-10-CM | POA: Diagnosis not present

## 2020-02-12 DIAGNOSIS — M5416 Radiculopathy, lumbar region: Secondary | ICD-10-CM | POA: Diagnosis not present

## 2020-02-12 DIAGNOSIS — M955 Acquired deformity of pelvis: Secondary | ICD-10-CM | POA: Diagnosis not present

## 2020-02-13 ENCOUNTER — Encounter: Payer: Self-pay | Admitting: Gastroenterology

## 2020-03-04 DIAGNOSIS — M5416 Radiculopathy, lumbar region: Secondary | ICD-10-CM | POA: Diagnosis not present

## 2020-03-04 DIAGNOSIS — M9903 Segmental and somatic dysfunction of lumbar region: Secondary | ICD-10-CM | POA: Diagnosis not present

## 2020-03-04 DIAGNOSIS — D860 Sarcoidosis of lung: Secondary | ICD-10-CM | POA: Diagnosis not present

## 2020-03-04 DIAGNOSIS — M955 Acquired deformity of pelvis: Secondary | ICD-10-CM | POA: Diagnosis not present

## 2020-03-04 DIAGNOSIS — M9905 Segmental and somatic dysfunction of pelvic region: Secondary | ICD-10-CM | POA: Diagnosis not present

## 2020-03-04 DIAGNOSIS — Z01818 Encounter for other preprocedural examination: Secondary | ICD-10-CM | POA: Diagnosis not present

## 2020-03-04 DIAGNOSIS — J4520 Mild intermittent asthma, uncomplicated: Secondary | ICD-10-CM | POA: Diagnosis not present

## 2020-03-18 ENCOUNTER — Telehealth: Payer: Self-pay | Admitting: Family Medicine

## 2020-03-18 NOTE — Telephone Encounter (Signed)
Copied from [place] [id number]. Topic: General - Other >> Mar 18, 2020 11:16 AM Keene Breath wrote: Reason for CRM: Patient has a nail fungus and would like some advice on the treatment.  Also she has questions regarding the booster vaccine.  Please call to discuss at [phone]

## 2020-03-19 NOTE — Telephone Encounter (Signed)
Can we schedule her for an OV?

## 2020-03-24 NOTE — Telephone Encounter (Signed)
Her appointment scheduled on Friday 03/27/2020 at 9:40 am.

## 2020-03-25 DIAGNOSIS — M9905 Segmental and somatic dysfunction of pelvic region: Secondary | ICD-10-CM | POA: Diagnosis not present

## 2020-03-25 DIAGNOSIS — M9903 Segmental and somatic dysfunction of lumbar region: Secondary | ICD-10-CM | POA: Diagnosis not present

## 2020-03-25 DIAGNOSIS — M5416 Radiculopathy, lumbar region: Secondary | ICD-10-CM | POA: Diagnosis not present

## 2020-03-25 DIAGNOSIS — M955 Acquired deformity of pelvis: Secondary | ICD-10-CM | POA: Diagnosis not present

## 2020-03-26 ENCOUNTER — Ambulatory Visit: Payer: Medicare PPO | Admitting: Family Medicine

## 2020-03-27 ENCOUNTER — Ambulatory Visit (INDEPENDENT_AMBULATORY_CARE_PROVIDER_SITE_OTHER): Payer: Medicare PPO | Admitting: Family Medicine

## 2020-03-27 ENCOUNTER — Other Ambulatory Visit: Payer: Self-pay

## 2020-03-27 ENCOUNTER — Encounter: Payer: Self-pay | Admitting: Family Medicine

## 2020-03-27 VITALS — BP 129/65 | HR 86 | Temp 98.2°F | Resp 17 | Ht 61.0 in | Wt 179.6 lb

## 2020-03-27 DIAGNOSIS — I10 Essential (primary) hypertension: Secondary | ICD-10-CM

## 2020-03-27 DIAGNOSIS — Z23 Encounter for immunization: Secondary | ICD-10-CM | POA: Insufficient documentation

## 2020-03-27 DIAGNOSIS — D860 Sarcoidosis of lung: Secondary | ICD-10-CM | POA: Diagnosis not present

## 2020-03-27 DIAGNOSIS — M791 Myalgia, unspecified site: Secondary | ICD-10-CM

## 2020-03-27 DIAGNOSIS — B351 Tinea unguium: Secondary | ICD-10-CM

## 2020-03-27 NOTE — Assessment & Plan Note (Signed)
Completed terbinafine 250mg  daily x 90 days with resolution of symptoms and recently has had a return of fungus to bilateral thumbs.  Discussed recurrence after completing treatment is approx 20%.  Can refer to dermatology for further evaluation and treatment.  Plan: 1. Referral to dermatology

## 2020-03-27 NOTE — Progress Notes (Signed)
Subjective:    Patient ID: Mary Burke, female    DOB: June 07, 1946, 74 y.o.   MRN: [id number]  Mary Burke is a 74 y.o. female presenting on 03/27/2020 for Nail Problem (bilateral thumb fungus, return after treatment. The symptoms improved and patient discontinued the treat in July of this year and the fungus return the following month x ) and Consult (questions about getting the booster [place] )   HPI  Mary Burke presents to clinic for concerns about recurrent nail fungus and questions regarding getting the Moderna booster vaccine.  She has completed 3 months of terbinafine with her nail concerns resolving, they have now returned.  Has concerns if she would be able to get her booster of the Moderna vaccine due to her underlying pulmonary condition.  Requesting information on where to locate immunization sites for boosters.  Depression screen [facility] 2/9 08/27/2019 08/21/2018 08/15/2017  Decreased Interest 0 0 0  Down, Depressed, Hopeless 0 0 0  PHQ - 2 Score 0 0 0    Social History   Tobacco Use  . Smoking status: Never Smoker  . Smokeless tobacco: Never Used  Vaping Use  . Vaping Use: Never used  Substance Use Topics  . Alcohol use: No  . Drug use: No    Review of Systems  Constitutional: Negative.   HENT: Negative.   Eyes: Negative.   Respiratory: Negative.   Cardiovascular: Negative.   Gastrointestinal: Negative.   Endocrine: Negative.   Genitourinary: Negative.   Musculoskeletal: Negative.   Skin: Negative.        Thumb nail fungus  Allergic/Immunologic: Negative.   Neurological: Negative.   Hematological: Negative.   Psychiatric/Behavioral: Negative.    Per HPI unless specifically indicated above     Objective:    BP 129/65 (BP Location: Left Arm, Patient Position: Sitting, Cuff Size: Normal)   Pulse 86   Temp 98.2 F (36.8 C) (Oral)   Resp 17   Ht 5\' 1"  (1.549 m)   Wt 179 lb 9.6 oz (81.5 kg)   SpO2 99%   BMI 33.94 kg/m   Wt Readings  from Last 3 Encounters:  03/27/20 179 lb 9.6 oz (81.5 kg)  02/03/20 179 lb (81.2 kg)  01/30/20 184 lb 9.6 oz (83.7 kg)    Physical Exam Vitals reviewed.  Constitutional:      General: She is not in acute distress.    Appearance: Normal appearance. She is well-developed and well-groomed. She is obese. She is not ill-appearing or toxic-appearing.  HENT:     Head: Normocephalic and atraumatic.     Nose:     Comments: Lizbeth Bark is in place, covering mouth and nose. Eyes:     General: Lids are normal. Vision grossly intact.        Right eye: No discharge.        Left eye: No discharge.     Extraocular Movements: Extraocular movements intact.     Conjunctiva/sclera: Conjunctivae normal.     Pupils: Pupils are equal, round, and reactive to light.  Cardiovascular:     Pulses: Normal pulses.          Dorsalis pedis pulses are 2+ on the right side and 2+ on the left side.  Pulmonary:     Effort: Pulmonary effort is normal. No respiratory distress.  Musculoskeletal:     Right lower leg: No edema.     Left lower leg: No edema.  Skin:    General: Skin is warm and  dry.     Capillary Refill: Capillary refill takes less than 2 seconds.     Comments: Bilateral thumb nails with fungus  Neurological:     General: No focal deficit present.     Mental Status: She is alert and oriented to person, place, and time.  Psychiatric:        Attention and Perception: Attention and perception normal.        Mood and Affect: Mood and affect normal.        Speech: Speech normal.        Behavior: Behavior normal. Behavior is cooperative.        Thought Content: Thought content normal.        Cognition and Memory: Cognition and memory normal.        Judgment: Judgment normal.    Results for orders placed or performed in visit on 02/03/20  Pathology  Result Value Ref Range   . Comment    . Comment    . Comment    . Comment:    . Comment    . Comment    . Comment    . Comment       Assessment &  Plan:   Problem List Items Addressed This Visit      Cardiovascular and Mediastinum   Essential hypertension    Stable and well controlled with current treatment plan.        Respiratory   Pulmonary sarcoidosis ([place])    Stable and followed by [facility]monology (Dr. Raul Del).  Requesting information regarding obtaining her booster of the [place] vaccine since she has an underlying pulmonary condition.  Local sites for COVID vaccination provided and how to schedule.  Letter written and provided to patient requesting ability to receive booster due to underlying conditions.        Musculoskeletal and Integument   Nail fungus - Primary    Completed terbinafine 250mg  daily x 90 days with resolution of symptoms and recently has had a return of fungus to bilateral thumbs.  Discussed recurrence after completing treatment is approx 20%.  Can refer to dermatology for further evaluation and treatment.  Plan: 1. Referral to dermatology      Relevant Orders   Ambulatory referral to Dermatology     Other   Myalgia due to statin    Unable to tolerate statin medications due to myalgia      Needs flu shot    Pt older than age 48.  Needs annual influenza vaccine.  VIS provided.  Plan: 1. Administer high dose fluzone today.       Relevant Orders   Flu Vaccine QUAD High Dose(Fluad) (Completed)      No orders of the defined types were placed in this encounter.  Follow up plan: Return in about 3 months (around 06/26/2020) for HTN.   Harlin Rain, [place] Family Nurse Practitioner [facility] 03/27/2020, 12:28 PM

## 2020-03-27 NOTE — Assessment & Plan Note (Signed)
Stable and well controlled with current treatment plan.

## 2020-03-27 NOTE — Assessment & Plan Note (Signed)
Stable and followed by [facility] Pulmonology (Dr. Raul Del).  Requesting information regarding obtaining her booster of the [place] vaccine since she has an underlying pulmonary condition.  Local sites for COVID vaccination provided and how to schedule.  Letter written and provided to patient requesting ability to receive booster due to underlying conditions.

## 2020-03-27 NOTE — Patient Instructions (Signed)
We have given you your influenza vaccine today.  A referral to Dermatology for recurrent nail fungus has been placed today.  If you have not heard from the specialty office or our referral coordinator within 1 week, please let us know and we will follow up with the referral coordinator for an update.  Can go on [email or web address]  Click on find 3rd dose Moderna and you can schedule your booster vaccine through [facility] appointment  We will plan to see you back in 3 months for hypertension follow up visit  You will receive a survey after today's visit either digitally by e-mail or paper by USPS mail. Your experiences and feedback matter to [place].  Please respond so we know how we are doing as we provide care for you.  Call us with any questions/concerns/needs.  It is my goal to be available to you for your health concerns.  Thanks for choosing me to be a partner in your healthcare needs!  Harlin Rain, FNP-C Family Nurse Practitioner [facility] Phone: [phone]

## 2020-03-27 NOTE — Assessment & Plan Note (Signed)
Unable to tolerate statin medications due to myalgia

## 2020-03-27 NOTE — Assessment & Plan Note (Signed)
Pt older than age 74.  Needs annual influenza vaccine.  VIS provided.  Plan: 1. Administer high dose fluzone today.

## 2020-04-01 ENCOUNTER — Other Ambulatory Visit: Payer: Self-pay

## 2020-04-01 ENCOUNTER — Encounter: Payer: Self-pay | Admitting: Dermatology

## 2020-04-01 ENCOUNTER — Ambulatory Visit: Payer: Medicare PPO | Admitting: Dermatology

## 2020-04-01 DIAGNOSIS — L608 Other nail disorders: Secondary | ICD-10-CM

## 2020-04-01 DIAGNOSIS — L603 Nail dystrophy: Secondary | ICD-10-CM

## 2020-04-01 DIAGNOSIS — L609 Nail disorder, unspecified: Secondary | ICD-10-CM

## 2020-04-01 MED ORDER — NUVAIL EX SOLN: CUTANEOUS | 11 refills | Status: DC

## 2020-04-01 NOTE — Progress Notes (Signed)
   Follow-Up Visit   Subjective  Mary Burke is a 74 y.o. female who presents for the following: Nail Problem (check thumbnails for a fungus, treated with Lamisil x 3 months prescribed by her PCP, finished July 2021, now black spots on her nails again).  The following portions of the chart were reviewed this encounter and updated as appropriate:  Tobacco  Allergies  Meds  Problems  Med Hx  Surg Hx  Fam Hx     Review of Systems:  No other skin or systemic complaints except as noted in HPI or Assessment and Plan.  Objective  Well appearing patient in no apparent distress; mood and affect are within normal limits.  A focused examination was performed including hands, fingernails . Relevant physical exam findings are noted in the Assessment and Plan.  Objective  L thumnail, R thumbnail: Discoloration under the thumbnail   Images        Objective  fingernails: Thin brittle nails    Assessment & Plan  Splinter hemorrhage of fingernail causing dark spots of nails L thumnail, R thumbnail No related to fungus  Usually trauma related. Pt with no fever or malaise, so unlikely splinter hemorrhages from other reasons such as bacterial endocarditis.  Pt to discuss this with PCP to decide if further evaluation warranted.  Nail problem with mild dystophy - S/P antifungal oral treatment. fingernails Discussed with pt no fungus noticed today, post Lamisil will take another 3 months to see the full affect on nails (as it takes 6 mos for nails to fully grow out)  Start Nuvail apply to nails daily, if not covered by insurance, pt can try  otc Dermanail   Ordered Medications: Dermatological Products, Misc. (NUVAIL) SOLN  Return if symptoms worsen or fail to improve.  IMarye Round, CMA, am acting as scribe for Sarina Ser, MD .  Documentation: I have reviewed the above documentation for accuracy and completeness, and I agree with the above.  Sarina Ser,  MD

## 2020-04-05 ENCOUNTER — Encounter: Payer: Self-pay | Admitting: Dermatology

## 2020-04-15 ENCOUNTER — Encounter: Payer: Self-pay | Admitting: Family Medicine

## 2020-04-15 DIAGNOSIS — S39012A Strain of muscle, fascia and tendon of lower back, initial encounter: Secondary | ICD-10-CM | POA: Diagnosis not present

## 2020-04-15 DIAGNOSIS — S29019A Strain of muscle and tendon of unspecified wall of thorax, initial encounter: Secondary | ICD-10-CM | POA: Diagnosis not present

## 2020-04-15 DIAGNOSIS — S161XXA Strain of muscle, fascia and tendon at neck level, initial encounter: Secondary | ICD-10-CM | POA: Diagnosis not present

## 2020-04-15 DIAGNOSIS — M4316 Spondylolisthesis, lumbar region: Secondary | ICD-10-CM | POA: Diagnosis not present

## 2020-04-17 DIAGNOSIS — M955 Acquired deformity of pelvis: Secondary | ICD-10-CM | POA: Diagnosis not present

## 2020-04-17 DIAGNOSIS — M9905 Segmental and somatic dysfunction of pelvic region: Secondary | ICD-10-CM | POA: Diagnosis not present

## 2020-04-17 DIAGNOSIS — M5416 Radiculopathy, lumbar region: Secondary | ICD-10-CM | POA: Diagnosis not present

## 2020-04-17 DIAGNOSIS — M9903 Segmental and somatic dysfunction of lumbar region: Secondary | ICD-10-CM | POA: Diagnosis not present

## 2020-04-20 DIAGNOSIS — M9903 Segmental and somatic dysfunction of lumbar region: Secondary | ICD-10-CM | POA: Diagnosis not present

## 2020-04-20 DIAGNOSIS — M5416 Radiculopathy, lumbar region: Secondary | ICD-10-CM | POA: Diagnosis not present

## 2020-04-20 DIAGNOSIS — M9905 Segmental and somatic dysfunction of pelvic region: Secondary | ICD-10-CM | POA: Diagnosis not present

## 2020-04-20 DIAGNOSIS — M955 Acquired deformity of pelvis: Secondary | ICD-10-CM | POA: Diagnosis not present

## 2020-04-22 DIAGNOSIS — M9905 Segmental and somatic dysfunction of pelvic region: Secondary | ICD-10-CM | POA: Diagnosis not present

## 2020-04-22 DIAGNOSIS — M955 Acquired deformity of pelvis: Secondary | ICD-10-CM | POA: Diagnosis not present

## 2020-04-22 DIAGNOSIS — M9903 Segmental and somatic dysfunction of lumbar region: Secondary | ICD-10-CM | POA: Diagnosis not present

## 2020-04-22 DIAGNOSIS — M5416 Radiculopathy, lumbar region: Secondary | ICD-10-CM | POA: Diagnosis not present

## 2020-04-24 DIAGNOSIS — M955 Acquired deformity of pelvis: Secondary | ICD-10-CM | POA: Diagnosis not present

## 2020-04-24 DIAGNOSIS — M9905 Segmental and somatic dysfunction of pelvic region: Secondary | ICD-10-CM | POA: Diagnosis not present

## 2020-04-24 DIAGNOSIS — M9903 Segmental and somatic dysfunction of lumbar region: Secondary | ICD-10-CM | POA: Diagnosis not present

## 2020-04-24 DIAGNOSIS — M5416 Radiculopathy, lumbar region: Secondary | ICD-10-CM | POA: Diagnosis not present

## 2020-04-27 DIAGNOSIS — M9905 Segmental and somatic dysfunction of pelvic region: Secondary | ICD-10-CM | POA: Diagnosis not present

## 2020-04-27 DIAGNOSIS — M5416 Radiculopathy, lumbar region: Secondary | ICD-10-CM | POA: Diagnosis not present

## 2020-04-27 DIAGNOSIS — M9903 Segmental and somatic dysfunction of lumbar region: Secondary | ICD-10-CM | POA: Diagnosis not present

## 2020-04-27 DIAGNOSIS — M955 Acquired deformity of pelvis: Secondary | ICD-10-CM | POA: Diagnosis not present

## 2020-05-05 DIAGNOSIS — M9905 Segmental and somatic dysfunction of pelvic region: Secondary | ICD-10-CM | POA: Diagnosis not present

## 2020-05-05 DIAGNOSIS — M5416 Radiculopathy, lumbar region: Secondary | ICD-10-CM | POA: Diagnosis not present

## 2020-05-05 DIAGNOSIS — M955 Acquired deformity of pelvis: Secondary | ICD-10-CM | POA: Diagnosis not present

## 2020-05-05 DIAGNOSIS — M9903 Segmental and somatic dysfunction of lumbar region: Secondary | ICD-10-CM | POA: Diagnosis not present

## 2020-05-07 ENCOUNTER — Ambulatory Visit: Payer: Medicare PPO | Admitting: Family Medicine

## 2020-05-12 DIAGNOSIS — M9905 Segmental and somatic dysfunction of pelvic region: Secondary | ICD-10-CM | POA: Diagnosis not present

## 2020-05-12 DIAGNOSIS — M9903 Segmental and somatic dysfunction of lumbar region: Secondary | ICD-10-CM | POA: Diagnosis not present

## 2020-05-12 DIAGNOSIS — M955 Acquired deformity of pelvis: Secondary | ICD-10-CM | POA: Diagnosis not present

## 2020-05-12 DIAGNOSIS — M5416 Radiculopathy, lumbar region: Secondary | ICD-10-CM | POA: Diagnosis not present

## 2020-05-13 DIAGNOSIS — M9905 Segmental and somatic dysfunction of pelvic region: Secondary | ICD-10-CM | POA: Diagnosis not present

## 2020-05-13 DIAGNOSIS — M9903 Segmental and somatic dysfunction of lumbar region: Secondary | ICD-10-CM | POA: Diagnosis not present

## 2020-05-13 DIAGNOSIS — M5416 Radiculopathy, lumbar region: Secondary | ICD-10-CM | POA: Diagnosis not present

## 2020-05-13 DIAGNOSIS — M955 Acquired deformity of pelvis: Secondary | ICD-10-CM | POA: Diagnosis not present

## 2020-05-15 DIAGNOSIS — M9903 Segmental and somatic dysfunction of lumbar region: Secondary | ICD-10-CM | POA: Diagnosis not present

## 2020-05-15 DIAGNOSIS — M9905 Segmental and somatic dysfunction of pelvic region: Secondary | ICD-10-CM | POA: Diagnosis not present

## 2020-05-15 DIAGNOSIS — M955 Acquired deformity of pelvis: Secondary | ICD-10-CM | POA: Diagnosis not present

## 2020-05-15 DIAGNOSIS — M5416 Radiculopathy, lumbar region: Secondary | ICD-10-CM | POA: Diagnosis not present

## 2020-05-16 ENCOUNTER — Encounter: Payer: Self-pay | Admitting: Family Medicine

## 2020-05-19 ENCOUNTER — Ambulatory Visit (INDEPENDENT_AMBULATORY_CARE_PROVIDER_SITE_OTHER): Payer: Medicare PPO | Admitting: Family Medicine

## 2020-05-19 ENCOUNTER — Encounter: Payer: Self-pay | Admitting: Family Medicine

## 2020-05-19 ENCOUNTER — Other Ambulatory Visit: Payer: Self-pay

## 2020-05-19 VITALS — BP 135/68 | HR 88 | Temp 97.6°F | Resp 18 | Ht 61.0 in | Wt 168.2 lb

## 2020-05-19 DIAGNOSIS — E782 Mixed hyperlipidemia: Secondary | ICD-10-CM | POA: Diagnosis not present

## 2020-05-19 DIAGNOSIS — R635 Abnormal weight gain: Secondary | ICD-10-CM

## 2020-05-19 DIAGNOSIS — Z021 Encounter for pre-employment examination: Secondary | ICD-10-CM | POA: Insufficient documentation

## 2020-05-19 DIAGNOSIS — M1990 Unspecified osteoarthritis, unspecified site: Secondary | ICD-10-CM

## 2020-05-19 DIAGNOSIS — Z13820 Encounter for screening for osteoporosis: Secondary | ICD-10-CM

## 2020-05-19 DIAGNOSIS — I10 Essential (primary) hypertension: Secondary | ICD-10-CM | POA: Diagnosis not present

## 2020-05-19 DIAGNOSIS — K219 Gastro-esophageal reflux disease without esophagitis: Secondary | ICD-10-CM | POA: Diagnosis not present

## 2020-05-19 DIAGNOSIS — E119 Type 2 diabetes mellitus without complications: Secondary | ICD-10-CM

## 2020-05-19 DIAGNOSIS — Z0000 Encounter for general adult medical examination without abnormal findings: Secondary | ICD-10-CM | POA: Diagnosis not present

## 2020-05-19 DIAGNOSIS — J302 Other seasonal allergic rhinitis: Secondary | ICD-10-CM

## 2020-05-19 DIAGNOSIS — Z0184 Encounter for antibody response examination: Secondary | ICD-10-CM

## 2020-05-19 LAB — POCT URINALYSIS DIPSTICK
Bilirubin, UA: NEGATIVE
Blood, UA: NEGATIVE
Glucose, UA: NEGATIVE
Ketones, UA: NEGATIVE
Leukocytes, UA: NEGATIVE
Nitrite, UA: NEGATIVE
Protein, UA: NEGATIVE
Spec Grav, UA: 1.015 (ref 1.010–1.025)
Urobilinogen, UA: 0.2 E.U./dL
pH, UA: 5 (ref 5.0–8.0)

## 2020-05-19 LAB — POCT GLYCOSYLATED HEMOGLOBIN (HGB A1C): Hemoglobin A1C: 5.8 % — AB (ref 4.0–5.6)

## 2020-05-19 MED ORDER — FAMOTIDINE 20 MG PO TABS: ORAL_TABLET | ORAL | 1 refills | Status: DC

## 2020-05-19 MED ORDER — LOSARTAN POTASSIUM 25 MG PO TABS: ORAL_TABLET | ORAL | 1 refills | Status: DC

## 2020-05-19 MED ORDER — MONTELUKAST SODIUM 10 MG PO TABS: 10.0000 mg | ORAL_TABLET | Freq: Every day | ORAL | 1 refills | Status: DC

## 2020-05-19 MED ORDER — DICLOFENAC SODIUM 75 MG PO TBEC: 75.0000 mg | DELAYED_RELEASE_TABLET | Freq: Two times a day (BID) | ORAL | 0 refills | Status: DC

## 2020-05-19 NOTE — Assessment & Plan Note (Signed)
Well-controlledDM with A1c 5.8% improved from 6.2% on 11/06/2019 and goal A1c < 7.0%.  Plan:  1. Continue current therapy: diet and exercise controlled 2. Encourage improved lifestyle: - low carb/low glycemic diet reinforced prior education - Increase physical activity to 30 minutes most days of the week.  Explained that increased physical activity increases body's use of sugar for energy. 3. Check fasting am CBG and log once weekly.  Bring log to next visit for review 4. Continue ASA and ARB 5. Up to date on DM eye and foot exam 6. Follow-up 6 months

## 2020-05-19 NOTE — Assessment & Plan Note (Signed)
Status unknown.  Recheck labs.  Followup after labs.  

## 2020-05-19 NOTE — Assessment & Plan Note (Signed)
Refills on losartan sent to pharmacy on file.  Stable and well controlled. Continue

## 2020-05-19 NOTE — Patient Instructions (Signed)
Have your labs drawn and we will contact you with the results.  For Mammogram screening for breast cancer and DEXA Scan (Bone mineral density) screening for osteoporosis  Call the [facility] below anytime to schedule your own appointment now that order has been placed.  [facility] [place], [place] [address] Phone: [phone]  [facility] [address] [place], [place] [address] Phone: [phone]  I have sent in refills to your pharmacy on file.  Well Visit: Care Instructions Overview  Well visits can help you stay healthy. Your provider has checked your overall health and may have suggested ways to take good care of yourself. Your provider also may have recommended tests. At home, you can help prevent illness with healthy eating, regular exercise, and other steps.  Follow-up care is a key part of your treatment and safety. Be sure to make and go to all appointments, and call your provider if you are having problems. It's also a good idea to know your test results and keep a list of the medicines you take.  How can you care for yourself at home?   Get screening tests that you and your doctor decide on. Screening helps find diseases before any symptoms appear.   Eat healthy foods. Choose fruits, vegetables, whole grains, protein, and low-fat dairy foods. Limit fat, especially saturated fat. Reduce salt in your diet.   Limit alcohol. If you are a man, have no more than 2 drinks a day or 14 drinks a week. If you are a woman, have no more than 1 drink a day or 7 drinks a week.   Get at least 30 minutes of physical activity on most days of the week.  We recommend you go no more than 2 days in a row without exercise. Walking is a good choice. You also may want to do other activities, such as running, swimming, cycling, or playing tennis or team sports. Discuss any changes in your exercise program  with your provider.   Reach and stay at a healthy weight. This will lower your risk for many problems, such as obesity, diabetes, heart disease, and high blood pressure.   Do not smoke or allow others to smoke around you. If you need help quitting, talk to your provider about stop-smoking programs and medicines. These can increase your chances of quitting for good.  Can call 1-800-QUIT-NOW 702-544-2427) for the [facility], assistance with smoking cessation.   Care for your mental health. It is easy to get weighed down by worry and stress. Learn strategies to manage stress, like deep breathing and mindfulness, and stay connected with your family and community. If you find you often feel sad or hopeless, talk with your provider. Treatment can help.   Talk to your provider about whether you have any risk factors for sexually transmitted infections (STIs). You can help prevent STIs if you wait to have sex with a new partner (or partners) until you've each been tested for STIs. It also helps if you use condoms (female or female condoms) and if you limit your sex partners to one person who only has sex with you. Vaccines are available for some STIs, such as HPV (these are age dependent).   If you think you may have a problem with alcohol or drug use, talk to your provider. This includes prescription medicines (such as amphetamines and opioids) and illegal drugs (such as cocaine and methamphetamine). Your provider  can help you figure out what type of treatment is best for you.   If you have concerns about domestic violence or intimate partner violence, there are resources available to you. National Domestic Abuse Hotline 684 401 9975   Protect your skin from too much sun. When you're outdoors from 10 a.m. to 4 p.m., stay in the shade or cover up with clothing and a hat with a wide brim. Wear sunglasses that block UV rays. Even when it's cloudy, put broad-spectrum sunscreen (SPF 30 or  higher) on any exposed skin.   See a dentist one or two times a year for checkups and to have your teeth cleaned.   See an eye doctor once per year for an eye exam.   Wear a seat belt in the car.  When should you call for help?  Watch closely for changes in your health, and be sure to contact your provider if you have any problems or symptoms that concern you.  We will plan to see you back in 3 months for diabetes and hypertension follow up visit  You will receive a survey after today's visit either digitally by e-mail or paper by [place] mail. Your experiences and feedback matter to [place].  Please respond so we know how we are doing as we provide care for you.  Call us with any questions/concerns/needs.  It is my goal to be available to you for your health concerns.  Thanks for choosing me to be a partner in your healthcare needs!  Harlin Rain, FNP-C Family Nurse Practitioner [facility] Phone: [phone]

## 2020-05-19 NOTE — Assessment & Plan Note (Signed)
Annual physical exam without new findings.  Plan: 1. Obtain health maintenance screenings as above according to age. - Increase physical activity to 30 minutes most days of the week.  - Eat healthy diet high in vegetables and fruits; low in refined carbohydrates. - Screening labs and tests as ordered 2. Return 1 year for annual physical.  

## 2020-05-19 NOTE — Assessment & Plan Note (Signed)
Work PE requesting titers for MMR and Hep B.  Will have labs drawn
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
## 2020-05-19 NOTE — Assessment & Plan Note (Signed)
Currently well controlled on famotidine 20mg  once daily.  Plan: 1. Continue famotidine 20mg  once daily. Side effects discussed. Pt wants to continue med. 2. Avoid diet triggers. Reviewed need to seek care if globus sensation, difficulty swallowing, s/sx of GI bleed. 3. Follow up as needed and in 6 months.

## 2020-05-19 NOTE — Assessment & Plan Note (Signed)
Pt postmenopausal w/ history of prior DEXA scan showing osteopenia 05/2018.  Plan: 1. Obtain DG bone density.

## 2020-05-19 NOTE — Assessment & Plan Note (Signed)
Work PE forms completed, awaiting lab and TB results

## 2020-05-19 NOTE — Assessment & Plan Note (Signed)
Stable and well controlled with flonase and singulair.  Requesting refill on singulair, will send refill to pharmacy on file.

## 2020-05-19 NOTE — Assessment & Plan Note (Signed)
Concerns for bilateral shoulder/neck discomfort has met with orthopedics and the chiropractor, is interested in trial of diclofenac 68m BID.  Will trial on this medication and have labs drawn today for evaluation of kidney function.  Plan: 1. Begin diclofenac 755mBID.  AVOID all NSAIDs (ibuprofen, advil, aleve, naproxen, naporsyn, goody's, BC powder) while taking this medication 2. RTC in 4 weeks for re-evaluation

## 2020-05-20 LAB — TSH+FREE T4: TSH W/REFLEX TO FT4: 1.28 mIU/L (ref 0.40–4.50)

## 2020-05-20 LAB — LIPID PANEL
Cholesterol: 242 mg/dL — ABNORMAL HIGH (ref <200)
HDL: 50 mg/dL (ref >=50)
LDL Cholesterol (Calc): 143 mg/dL (calc) — ABNORMAL HIGH
Non-HDL Cholesterol (Calc): 192 mg/dL (calc) — ABNORMAL HIGH (ref <130)
Total CHOL/HDL Ratio: 4.8 (calc) (ref <5.0)
Triglycerides: 318 mg/dL — ABNORMAL HIGH (ref <150)

## 2020-05-20 LAB — CBC WITH DIFFERENTIAL/PLATELET
Absolute Monocytes: 702 cells/uL (ref 200–950)
Basophils Absolute: 78 cells/uL (ref 0–200)
Basophils Relative: 1.2 %
Eosinophils Absolute: 124 cells/uL (ref 15–500)
Eosinophils Relative: 1.9 %
HCT: 46.1 % — ABNORMAL HIGH (ref 35.0–45.0)
Hemoglobin: 15 g/dL (ref 11.7–15.5)
Lymphs Abs: 1723 cells/uL (ref 850–3900)
MCH: 30 pg (ref 27.0–33.0)
MCHC: 32.5 g/dL (ref 32.0–36.0)
MCV: 92.2 fL (ref 80.0–100.0)
MPV: 10.2 fL (ref 7.5–12.5)
Monocytes Relative: 10.8 %
Neutro Abs: 3874 cells/uL (ref 1500–7800)
Neutrophils Relative %: 59.6 %
Platelets: 369 10*3/uL (ref 140–400)
RBC: 5 10*6/uL (ref 3.80–5.10)
RDW: 12.9 % (ref 11.0–15.0)
Total Lymphocyte: 26.5 %
WBC: 6.5 10*3/uL (ref 3.8–10.8)

## 2020-05-20 LAB — MEASLES/MUMPS/RUBELLA IMMUNITY
Mumps IgG: 66.1 AU/mL
Rubella: 17.3 Index
Rubeola IgG: >300 AU/mL

## 2020-05-20 LAB — COMPLETE METABOLIC PANEL WITH GFR
AG Ratio: 2.1 (calc) (ref 1.0–2.5)
ALT: 17 U/L (ref 6–29)
AST: 20 U/L (ref 10–35)
Albumin: 4.8 g/dL (ref 3.6–5.1)
Alkaline phosphatase (APISO): 64 U/L (ref 37–153)
BUN: 11 mg/dL (ref 7–25)
CO2: 27 mmol/L (ref 20–32)
Calcium: 10 mg/dL (ref 8.6–10.4)
Chloride: 105 mmol/L (ref 98–110)
Creat: 0.61 mg/dL (ref 0.60–0.93)
GFR, Est African American: 106 mL/min/{1.73_m2} (ref >=60)
GFR, Est Non African American: 91 mL/min/{1.73_m2} (ref >=60)
Globulin: 2.3 g/dL (calc) (ref 1.9–3.7)
Glucose, Bld: 103 mg/dL — ABNORMAL HIGH (ref 65–99)
Potassium: 4.5 mmol/L (ref 3.5–5.3)
Sodium: 141 mmol/L (ref 135–146)
Total Bilirubin: 0.5 mg/dL (ref 0.2–1.2)
Total Protein: 7.1 g/dL (ref 6.1–8.1)

## 2020-05-20 LAB — HEPATITIS B SURFACE ANTIGEN: Hepatitis B Surface Ag: NONREACTIVE

## 2020-05-21 LAB — TB SKIN TEST
Induration: 0 mm
TB Skin Test: NEGATIVE

## 2020-05-26 DIAGNOSIS — M9905 Segmental and somatic dysfunction of pelvic region: Secondary | ICD-10-CM | POA: Diagnosis not present

## 2020-05-26 DIAGNOSIS — M955 Acquired deformity of pelvis: Secondary | ICD-10-CM | POA: Diagnosis not present

## 2020-05-26 DIAGNOSIS — M9903 Segmental and somatic dysfunction of lumbar region: Secondary | ICD-10-CM | POA: Diagnosis not present

## 2020-05-26 DIAGNOSIS — M5416 Radiculopathy, lumbar region: Secondary | ICD-10-CM | POA: Diagnosis not present

## 2020-06-03 ENCOUNTER — Ambulatory Visit (INDEPENDENT_AMBULATORY_CARE_PROVIDER_SITE_OTHER): Payer: Medicare PPO

## 2020-06-03 ENCOUNTER — Ambulatory Visit
Admission: RE | Admit: 2020-06-03 | Discharge: 2020-06-03 | Disposition: A | Discharge status: Home / Self Care | Payer: Medicare PPO | Source: Ambulatory Visit | Attending: Family Medicine | Admitting: Family Medicine

## 2020-06-03 ENCOUNTER — Other Ambulatory Visit: Payer: Self-pay

## 2020-06-03 VITALS — BP 139/73 | HR 99 | Temp 98.3°F | Resp 18 | Ht 61.0 in | Wt 169.0 lb

## 2020-06-03 DIAGNOSIS — M79642 Pain in left hand: Secondary | ICD-10-CM | POA: Diagnosis not present

## 2020-06-03 DIAGNOSIS — M7989 Other specified soft tissue disorders: Secondary | ICD-10-CM | POA: Diagnosis not present

## 2020-06-03 DIAGNOSIS — S60222A Contusion of left hand, initial encounter: Secondary | ICD-10-CM | POA: Diagnosis not present

## 2020-06-03 DIAGNOSIS — M25442 Effusion, left hand: Secondary | ICD-10-CM

## 2020-06-03 NOTE — Discharge Instructions (Signed)
Rest, ice, elevation.  Tylenol 1000 mg three times daily as needed.  Take care  Dr. Lacinda Axon
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
## 2020-06-03 NOTE — ED Triage Notes (Signed)
Patient states she hit her left hand on the handle of her rolling bag yesterday. She is c/o redness, swelling, and pain.

## 2020-06-04 DIAGNOSIS — E119 Type 2 diabetes mellitus without complications: Secondary | ICD-10-CM | POA: Diagnosis not present

## 2020-06-04 LAB — HM DIABETES EYE EXAM

## 2020-06-08 DIAGNOSIS — M9905 Segmental and somatic dysfunction of pelvic region: Secondary | ICD-10-CM | POA: Diagnosis not present

## 2020-06-08 DIAGNOSIS — M9903 Segmental and somatic dysfunction of lumbar region: Secondary | ICD-10-CM | POA: Diagnosis not present

## 2020-06-08 DIAGNOSIS — M5416 Radiculopathy, lumbar region: Secondary | ICD-10-CM | POA: Diagnosis not present

## 2020-06-08 DIAGNOSIS — M955 Acquired deformity of pelvis: Secondary | ICD-10-CM | POA: Diagnosis not present

## 2020-06-16 ENCOUNTER — Other Ambulatory Visit: Payer: Self-pay | Admitting: Family Medicine

## 2020-06-16 DIAGNOSIS — M1990 Unspecified osteoarthritis, unspecified site: Secondary | ICD-10-CM

## 2020-06-18 ENCOUNTER — Other Ambulatory Visit: Payer: Self-pay

## 2020-06-18 ENCOUNTER — Ambulatory Visit
Admission: RE | Admit: 2020-06-18 | Discharge: 2020-06-18 | Disposition: A | Discharge status: Home / Self Care | Payer: Medicare PPO | Source: Ambulatory Visit | Attending: Family Medicine | Admitting: Family Medicine

## 2020-06-18 DIAGNOSIS — D869 Sarcoidosis, unspecified: Secondary | ICD-10-CM | POA: Diagnosis not present

## 2020-06-18 DIAGNOSIS — R7303 Prediabetes: Secondary | ICD-10-CM | POA: Diagnosis not present

## 2020-06-18 DIAGNOSIS — Z780 Asymptomatic menopausal state: Secondary | ICD-10-CM | POA: Diagnosis not present

## 2020-06-18 DIAGNOSIS — Z1231 Encounter for screening mammogram for malignant neoplasm of breast: Secondary | ICD-10-CM

## 2020-06-18 DIAGNOSIS — R29890 Loss of height: Secondary | ICD-10-CM | POA: Diagnosis not present

## 2020-06-18 DIAGNOSIS — M85852 Other specified disorders of bone density and structure, left thigh: Secondary | ICD-10-CM | POA: Insufficient documentation

## 2020-06-18 DIAGNOSIS — Z13820 Encounter for screening for osteoporosis: Secondary | ICD-10-CM | POA: Insufficient documentation

## 2020-06-18 DIAGNOSIS — Z8781 Personal history of (healed) traumatic fracture: Secondary | ICD-10-CM | POA: Diagnosis not present

## 2020-06-23 DIAGNOSIS — M9903 Segmental and somatic dysfunction of lumbar region: Secondary | ICD-10-CM | POA: Diagnosis not present

## 2020-06-23 DIAGNOSIS — M5416 Radiculopathy, lumbar region: Secondary | ICD-10-CM | POA: Diagnosis not present

## 2020-06-23 DIAGNOSIS — M955 Acquired deformity of pelvis: Secondary | ICD-10-CM | POA: Diagnosis not present

## 2020-06-23 DIAGNOSIS — M9905 Segmental and somatic dysfunction of pelvic region: Secondary | ICD-10-CM | POA: Diagnosis not present

## 2020-07-20 ENCOUNTER — Encounter: Payer: Self-pay | Admitting: Family Medicine

## 2020-07-21 DIAGNOSIS — M9903 Segmental and somatic dysfunction of lumbar region: Secondary | ICD-10-CM | POA: Diagnosis not present

## 2020-07-21 DIAGNOSIS — M5416 Radiculopathy, lumbar region: Secondary | ICD-10-CM | POA: Diagnosis not present

## 2020-07-21 DIAGNOSIS — M955 Acquired deformity of pelvis: Secondary | ICD-10-CM | POA: Diagnosis not present

## 2020-07-21 DIAGNOSIS — M9905 Segmental and somatic dysfunction of pelvic region: Secondary | ICD-10-CM | POA: Diagnosis not present

## 2020-07-24 ENCOUNTER — Telehealth: Payer: Self-pay | Admitting: Family Medicine

## 2020-07-24 NOTE — Telephone Encounter (Signed)
Copied from [place] [id number]. Topic: Medicare AWV >> Jul 24, 2020  2:22 PM Cher Nakai R wrote: Reason for CRM:  Tried contacting patient - no answer and voice mail is  full -  AWVS had to be rescheduled to 09/15/2020 to be done by phone at 3:20 -srs

## 2020-08-18 DIAGNOSIS — M9905 Segmental and somatic dysfunction of pelvic region: Secondary | ICD-10-CM | POA: Diagnosis not present

## 2020-08-18 DIAGNOSIS — M955 Acquired deformity of pelvis: Secondary | ICD-10-CM | POA: Diagnosis not present

## 2020-08-18 DIAGNOSIS — M9903 Segmental and somatic dysfunction of lumbar region: Secondary | ICD-10-CM | POA: Diagnosis not present

## 2020-08-18 DIAGNOSIS — M5416 Radiculopathy, lumbar region: Secondary | ICD-10-CM | POA: Diagnosis not present

## 2020-08-19 ENCOUNTER — Other Ambulatory Visit: Payer: Self-pay | Admitting: Family Medicine

## 2020-08-19 DIAGNOSIS — E119 Type 2 diabetes mellitus without complications: Secondary | ICD-10-CM

## 2020-08-19 NOTE — Telephone Encounter (Signed)
Requested medication (s) are due for refill today: Yes  Requested medication (s) are on the active medication list: Yes  Last refill:  12/27/18  Future visit scheduled: Yes  Notes to clinic:  Expired.    Requested Prescriptions  Pending Prescriptions Disp Refills   ONETOUCH ULTRA test strip [Pharmacy Med Name: [place] TESTST(NEW)100] 100 strip     Sig: USE AS INSTRUCTED UPTO TWICE DAILY      Endocrinology: Diabetes - Testing Supplies Passed - 08/19/2020  8:03 AM      Passed - Valid encounter within last 12 months    Recent Outpatient Visits           3 months ago Annual physical exam   [facility], Lupita Raider, FNP   4 months ago Nail fungus   [facility], Lupita Raider, FNP   6 months ago Nail fungus   [facility], Lupita Raider, FNP   9 months ago Controlled type 2 diabetes mellitus without complication, without long-term current use of insulin [facility])   [place], FNP   1 year ago SOB (shortness of breath)   [facility] [place], [place], [place]

## 2020-08-24 ENCOUNTER — Other Ambulatory Visit: Payer: Self-pay

## 2020-08-24 DIAGNOSIS — E119 Type 2 diabetes mellitus without complications: Secondary | ICD-10-CM

## 2020-08-24 MED ORDER — BLOOD GLUCOSE METER KIT: PACK | 0 refills | Status: DC

## 2020-08-25 ENCOUNTER — Other Ambulatory Visit: Payer: Self-pay

## 2020-08-25 ENCOUNTER — Telehealth: Payer: Self-pay

## 2020-08-25 DIAGNOSIS — E119 Type 2 diabetes mellitus without complications: Secondary | ICD-10-CM

## 2020-08-25 MED ORDER — ACCU-CHEK SOFTCLIX LANCETS MISC: 12 refills | Status: DC

## 2020-08-25 MED ORDER — ACCU-CHEK GUIDE VI STRP: ORAL_STRIP | 12 refills | Status: DC

## 2020-08-25 MED ORDER — ACCU-CHEK AVIVA CONNECT W/DEVICE KIT: 1.0000 | PACK | Freq: Two times a day (BID) | 0 refills | Status: DC

## 2020-08-25 NOTE — Telephone Encounter (Signed)
Copied from [place] [id number]. Topic: General - Inquiry >> Aug 24, 2020 10:57 AM Greggory Keen D wrote: Reason for CRM: Pt called saying she spoke to her pharmacy and they told her a new rx should be sent in for the [employer] meter and supplies.  She needs a new meter now due to insurance.  She uses [facility]  CB# [phone]

## 2020-09-01 ENCOUNTER — Ambulatory Visit: Payer: Medicare PPO

## 2020-09-02 DIAGNOSIS — D860 Sarcoidosis of lung: Secondary | ICD-10-CM | POA: Diagnosis not present

## 2020-09-02 DIAGNOSIS — J4520 Mild intermittent asthma, uncomplicated: Secondary | ICD-10-CM | POA: Diagnosis not present

## 2020-09-02 DIAGNOSIS — J9811 Atelectasis: Secondary | ICD-10-CM | POA: Diagnosis not present

## 2020-09-02 DIAGNOSIS — D869 Sarcoidosis, unspecified: Secondary | ICD-10-CM | POA: Diagnosis not present

## 2020-09-02 DIAGNOSIS — K449 Diaphragmatic hernia without obstruction or gangrene: Secondary | ICD-10-CM | POA: Diagnosis not present

## 2020-09-15 ENCOUNTER — Ambulatory Visit: Payer: Medicare PPO

## 2020-09-15 DIAGNOSIS — M955 Acquired deformity of pelvis: Secondary | ICD-10-CM | POA: Diagnosis not present

## 2020-09-15 DIAGNOSIS — M5416 Radiculopathy, lumbar region: Secondary | ICD-10-CM | POA: Diagnosis not present

## 2020-09-15 DIAGNOSIS — M9903 Segmental and somatic dysfunction of lumbar region: Secondary | ICD-10-CM | POA: Diagnosis not present

## 2020-09-15 DIAGNOSIS — M9905 Segmental and somatic dysfunction of pelvic region: Secondary | ICD-10-CM | POA: Diagnosis not present

## 2020-09-23 DIAGNOSIS — E782 Mixed hyperlipidemia: Secondary | ICD-10-CM | POA: Diagnosis not present

## 2020-09-23 DIAGNOSIS — D860 Sarcoidosis of lung: Secondary | ICD-10-CM | POA: Diagnosis not present

## 2020-09-23 DIAGNOSIS — E119 Type 2 diabetes mellitus without complications: Secondary | ICD-10-CM | POA: Diagnosis not present

## 2020-09-23 DIAGNOSIS — Z136 Encounter for screening for cardiovascular disorders: Secondary | ICD-10-CM | POA: Diagnosis not present

## 2020-09-23 DIAGNOSIS — R0602 Shortness of breath: Secondary | ICD-10-CM | POA: Diagnosis not present

## 2020-09-23 DIAGNOSIS — I10 Essential (primary) hypertension: Secondary | ICD-10-CM | POA: Diagnosis not present

## 2020-09-23 DIAGNOSIS — I700 Atherosclerosis of aorta: Secondary | ICD-10-CM | POA: Diagnosis not present

## 2020-09-23 DIAGNOSIS — R600 Localized edema: Secondary | ICD-10-CM | POA: Diagnosis not present

## 2020-09-29 ENCOUNTER — Ambulatory Visit (INDEPENDENT_AMBULATORY_CARE_PROVIDER_SITE_OTHER): Payer: Medicare PPO

## 2020-09-29 VITALS — Ht 62.0 in | Wt 156.0 lb

## 2020-09-29 DIAGNOSIS — Z0000 Encounter for general adult medical examination without abnormal findings: Secondary | ICD-10-CM

## 2020-09-29 DIAGNOSIS — E782 Mixed hyperlipidemia: Secondary | ICD-10-CM | POA: Diagnosis not present

## 2020-09-29 NOTE — Patient Instructions (Signed)
Mary Burke , Thank you for taking time to come for your Medicare Wellness Visit. I appreciate your ongoing commitment to your health goals. Please review the following plan we discussed and let me know if I can assist you in the future.   Screening recommendations/referrals: Colonoscopy: completed 02/03/2020, due 02/03/2023 Mammogram: completed 06/18/2020 Bone Density: completed 06/18/2020 Recommended yearly ophthalmology/optometry visit for glaucoma screening and checkup Recommended yearly dental visit for hygiene and checkup  Vaccinations: Influenza vaccine: completed 03/27/2020, due 02/15/2021 Pneumococcal vaccine: completed 08/09/2016 Tdap vaccine: completed 05/06/2015, due 05/05/2025 Shingles vaccine: completed   Covid-19: 04/11/2020, 12/10/2019, 11/12/2019  Advanced directives: Please bring a copy of your POA (Power of Attorney) and/or Living Will to your next appointment.   Conditions/risks identified: none  Next appointment: Follow up in one year for your annual wellness visit    Preventive Care 65 Years and Older, Female Preventive care refers to lifestyle choices and visits with your health care provider that can promote health and wellness. What does preventive care include?  A yearly physical exam. This is also called an annual well check.  Dental exams once or twice a year.  Routine eye exams. Ask your health care provider how often you should have your eyes checked.  Personal lifestyle choices, including:  Daily care of your teeth and gums.  Regular physical activity.  Eating a healthy diet.  Avoiding tobacco and drug use.  Limiting alcohol use.  Practicing safe sex.  Taking low-dose aspirin every day.  Taking vitamin and mineral supplements as recommended by your health care provider. What happens during an annual well check? The services and screenings done by your health care provider during your annual well check will depend on your age, overall health,  lifestyle risk factors, and family history of disease. Counseling  Your health care provider may ask you questions about your:  Alcohol use.  Tobacco use.  Drug use.  Emotional well-being.  Home and relationship well-being.  Sexual activity.  Eating habits.  History of falls.  Memory and ability to understand (cognition).  Work and work Statistician.  Reproductive health. Screening  You may have the following tests or measurements:  Height, weight, and BMI.  Blood pressure.  Lipid and cholesterol levels. These may be checked every 5 years, or more frequently if you are over 71 years old.  Skin check.  Lung cancer screening. You may have this screening every year starting at age 77 if you have a 30-pack-year history of smoking and currently smoke or have quit within the past 15 years.  Fecal occult blood test (FOBT) of the stool. You may have this test every year starting at age 40.  Flexible sigmoidoscopy or colonoscopy. You may have a sigmoidoscopy every 5 years or a colonoscopy every 10 years starting at age 71.  Hepatitis C blood test.  Hepatitis B blood test.  Sexually transmitted disease (STD) testing.  Diabetes screening. This is done by checking your blood sugar (glucose) after you have not eaten for a while (fasting). You may have this done every 1-3 years.  Bone density scan. This is done to screen for osteoporosis. You may have this done starting at age 5.  Mammogram. This may be done every 1-2 years. Talk to your health care provider about how often you should have regular mammograms. Talk with your health care provider about your test results, treatment options, and if necessary, the need for more tests. Vaccines  Your health care provider may recommend certain vaccines, such as:  Influenza vaccine. This is recommended every year.  Tetanus, diphtheria, and acellular pertussis (Tdap, Td) vaccine. You may need a Td booster every 10 years.  Zoster  vaccine. You may need this after age 61.  Pneumococcal 13-valent conjugate (PCV13) vaccine. One dose is recommended after age 52.  Pneumococcal polysaccharide (PPSV23) vaccine. One dose is recommended after age 66. Talk to your health care provider about which screenings and vaccines you need and how often you need them. This information is not intended to replace advice given to you by your health care provider. Make sure you discuss any questions you have with your health care provider. Document Released: 07/31/2015 Document Revised: 03/23/2016 Document Reviewed: 05/05/2015 Elsevier Interactive Patient Education  2017 [employer] Prevention in the Home Falls can cause injuries. They can happen to people of all ages. There are many things you can do to make your home safe and to help prevent falls. What can I do on the outside of my home?  Regularly fix the edges of walkways and driveways and fix any cracks.  Remove anything that might make you trip as you walk through a door, such as a raised step or threshold.  Trim any bushes or trees on the path to your home.  Use bright outdoor lighting.  Clear any walking paths of anything that might make someone trip, such as rocks or tools.  Regularly check to see if handrails are loose or broken. Make sure that both sides of any steps have handrails.  Any raised decks and porches should have guardrails on the edges.  Have any leaves, snow, or ice cleared regularly.  Use sand or salt on walking paths during winter.  Clean up any spills in your garage right away. This includes oil or grease spills. What can I do in the bathroom?  Use night lights.  Install grab bars by the toilet and in the tub and shower. Do not use towel bars as grab bars.  Use non-skid mats or decals in the tub or shower.  If you need to sit down in the shower, use a plastic, non-slip stool.  Keep the floor dry. Clean up any water that spills on the  floor as soon as it happens.  Remove soap buildup in the tub or shower regularly.  Attach bath mats securely with double-sided non-slip rug tape.  Do not have throw rugs and other things on the floor that can make you trip. What can I do in the bedroom?  Use night lights.  Make sure that you have a light by your bed that is easy to reach.  Do not use any sheets or blankets that are too big for your bed. They should not hang down onto the floor.  Have a firm chair that has side arms. You can use this for support while you get dressed.  Do not have throw rugs and other things on the floor that can make you trip. What can I do in the kitchen?  Clean up any spills right away.  Avoid walking on wet floors.  Keep items that you use a lot in easy-to-reach places.  If you need to reach something above you, use a strong step stool that has a grab bar.  Keep electrical cords out of the way.  Do not use floor polish or wax that makes floors slippery. If you must use wax, use non-skid floor wax.  Do not have throw rugs and other things on the floor that  can make you trip. What can I do with my stairs?  Do not leave any items on the stairs.  Make sure that there are handrails on both sides of the stairs and use them. Fix handrails that are broken or loose. Make sure that handrails are as long as the stairways.  Check any carpeting to make sure that it is firmly attached to the stairs. Fix any carpet that is loose or worn.  Avoid having throw rugs at the top or bottom of the stairs. If you do have throw rugs, attach them to the floor with carpet tape.  Make sure that you have a light switch at the top of the stairs and the bottom of the stairs. If you do not have them, ask someone to add them for you. What else can I do to help prevent falls?  Wear shoes that:  Do not have high heels.  Have rubber bottoms.  Are comfortable and fit you well.  Are closed at the toe. Do not wear  sandals.  If you use a stepladder:  Make sure that it is fully opened. Do not climb a closed stepladder.  Make sure that both sides of the stepladder are locked into place.  Ask someone to hold it for you, if possible.  Clearly mark and make sure that you can see:  Any grab bars or handrails.  First and last steps.  Where the edge of each step is.  Use tools that help you move around (mobility aids) if they are needed. These include:  Canes.  Walkers.  Scooters.  Crutches.  Turn on the lights when you go into a dark area. Replace any light bulbs as soon as they burn out.  Set up your furniture so you have a clear path. Avoid moving your furniture around.  If any of your floors are uneven, fix them.  If there are any pets around you, be aware of where they are.  Review your medicines with your doctor. Some medicines can make you feel dizzy. This can increase your chance of falling. Ask your doctor what other things that you can do to help prevent falls. This information is not intended to replace advice given to you by your health care provider. Make sure you discuss any questions you have with your health care provider. Document Released: 04/30/2009 Document Revised: 12/10/2015 Document Reviewed: 08/08/2014 Elsevier Interactive Patient Education  2017 [employer].
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
## 2020-10-13 DIAGNOSIS — M9903 Segmental and somatic dysfunction of lumbar region: Secondary | ICD-10-CM | POA: Diagnosis not present

## 2020-10-13 DIAGNOSIS — M955 Acquired deformity of pelvis: Secondary | ICD-10-CM | POA: Diagnosis not present

## 2020-10-13 DIAGNOSIS — M9905 Segmental and somatic dysfunction of pelvic region: Secondary | ICD-10-CM | POA: Diagnosis not present

## 2020-10-13 DIAGNOSIS — M5416 Radiculopathy, lumbar region: Secondary | ICD-10-CM | POA: Diagnosis not present

## 2020-11-10 DIAGNOSIS — M5416 Radiculopathy, lumbar region: Secondary | ICD-10-CM | POA: Diagnosis not present

## 2020-11-10 DIAGNOSIS — M955 Acquired deformity of pelvis: Secondary | ICD-10-CM | POA: Diagnosis not present

## 2020-11-10 DIAGNOSIS — M9905 Segmental and somatic dysfunction of pelvic region: Secondary | ICD-10-CM | POA: Diagnosis not present

## 2020-11-10 DIAGNOSIS — M9903 Segmental and somatic dysfunction of lumbar region: Secondary | ICD-10-CM | POA: Diagnosis not present

## 2020-11-18 ENCOUNTER — Other Ambulatory Visit: Payer: Self-pay | Admitting: Family Medicine

## 2020-11-18 DIAGNOSIS — E785 Hyperlipidemia, unspecified: Secondary | ICD-10-CM | POA: Diagnosis not present

## 2020-11-18 DIAGNOSIS — J302 Other seasonal allergic rhinitis: Secondary | ICD-10-CM

## 2020-11-18 DIAGNOSIS — I10 Essential (primary) hypertension: Secondary | ICD-10-CM

## 2020-11-18 DIAGNOSIS — Z136 Encounter for screening for cardiovascular disorders: Secondary | ICD-10-CM | POA: Diagnosis not present

## 2020-11-18 DIAGNOSIS — I6523 Occlusion and stenosis of bilateral carotid arteries: Secondary | ICD-10-CM | POA: Diagnosis not present

## 2020-11-18 DIAGNOSIS — E1169 Type 2 diabetes mellitus with other specified complication: Secondary | ICD-10-CM | POA: Diagnosis not present

## 2020-11-18 MED ORDER — LOSARTAN POTASSIUM 25 MG PO TABS: ORAL_TABLET | ORAL | 0 refills | Status: DC

## 2020-11-18 MED ORDER — MONTELUKAST SODIUM 10 MG PO TABS: 10.0000 mg | ORAL_TABLET | Freq: Every day | ORAL | 0 refills | Status: DC

## 2020-11-18 NOTE — Telephone Encounter (Signed)
Medication Refill - Medication: 90 day supply montelukast (SINGULAIR) 10 MG tablet , losartan (COZAAR) 25 MG tablet, losartan (COZAAR) 25 MG tablet  Has the patient contacted their pharmacy? Yes , Contact PCP office due to PCP no longer be in the practice. Patient has a follow up with new provider on 12/01/2020   (Agent: If yes, when and what did the pharmacy advise?)   Preferred Pharmacy (with phone number or street name):  [facility] [place], [place] - [address] Phone:  [phone]  Fax:  [phone]       Agent: Please be advised that RX refills may take up to 3 business days. We ask that you follow-up with your pharmacy.

## 2020-11-18 NOTE — Telephone Encounter (Signed)
Notes to clinic: Patient has upcoming appt on 12/01/2020 Review for refill    Requested Prescriptions  Pending Prescriptions Disp Refills   montelukast (SINGULAIR) 10 MG tablet 90 tablet 1    Sig: Take 1 tablet (10 mg total) by mouth daily with supper.      Pulmonology:  Leukotriene Inhibitors Passed - 11/18/2020  9:57 AM      Passed - Valid encounter within last 12 months    Recent Outpatient Visits           6 months ago Annual physical exam   [facility], Lupita Raider, FNP   7 months ago Nail fungus   [facility], Lupita Raider, FNP   9 months ago Nail fungus   [facility], Lupita Raider, FNP   1 year ago Controlled type 2 diabetes mellitus without complication, without long-term current use of insulin ([place])   [facility], Lupita Raider, FNP   1 year ago SOB (shortness of breath)   [facility], [place], PA-C       Future Appointments             In 1 week Garnette Gunner, Coralie Keens, NP [facility], [place]   In 10 months  [facility]               losartan (COZAAR) 25 MG tablet 90 tablet 1    Sig: TAKE 1 TABLET(25 MG) BY MOUTH DAILY      Cardiovascular:  Angiotensin Receptor Blockers Failed - 11/18/2020  9:57 AM      Failed - Cr in normal range and within 180 days    Creat  Date Value Ref Range Status  05/19/2020 0.61 0.60 - 0.93 mg/dL Final    Comment:    For patients >94 years of age, the reference limit for Creatinine is approximately 13% higher for people identified as African-American. .           Failed - K in normal range and within 180 days    Potassium  Date Value Ref Range Status  05/19/2020 4.5 3.5 - 5.3 mmol/L Final          Failed - Valid encounter within last 6 months    Recent Outpatient Visits           6 months ago Annual physical exam   [facility], Lupita Raider, FNP   7 months ago  Nail fungus   [facility], Lupita Raider, FNP   9 months ago Nail fungus   [facility], Lupita Raider, FNP   1 year ago Controlled type 2 diabetes mellitus without complication, without long-term current use of insulin [facility])   [place], FNP   1 year ago SOB (shortness of breath)   [place], [place]       Future Appointments             In 1 week Garnette Gunner, Coralie Keens, NP [facility], [place]   In 10 months  [facility], [place] - Patient is not pregnant      Passed - Last BP in normal range    BP Readings from Last 1 Encounters:  06/03/20 139/73

## 2020-12-01 ENCOUNTER — Other Ambulatory Visit: Payer: Self-pay

## 2020-12-01 ENCOUNTER — Encounter: Payer: Self-pay | Admitting: Internal Medicine

## 2020-12-01 ENCOUNTER — Ambulatory Visit: Payer: Medicare PPO | Admitting: Internal Medicine

## 2020-12-01 DIAGNOSIS — E119 Type 2 diabetes mellitus without complications: Secondary | ICD-10-CM | POA: Diagnosis not present

## 2020-12-01 DIAGNOSIS — I700 Atherosclerosis of aorta: Secondary | ICD-10-CM

## 2020-12-01 DIAGNOSIS — M791 Myalgia, unspecified site: Secondary | ICD-10-CM

## 2020-12-01 DIAGNOSIS — D860 Sarcoidosis of lung: Secondary | ICD-10-CM

## 2020-12-01 DIAGNOSIS — I10 Essential (primary) hypertension: Secondary | ICD-10-CM

## 2020-12-01 DIAGNOSIS — M8580 Other specified disorders of bone density and structure, unspecified site: Secondary | ICD-10-CM

## 2020-12-01 DIAGNOSIS — J4520 Mild intermittent asthma, uncomplicated: Secondary | ICD-10-CM | POA: Diagnosis not present

## 2020-12-01 DIAGNOSIS — E782 Mixed hyperlipidemia: Secondary | ICD-10-CM

## 2020-12-01 DIAGNOSIS — M1990 Unspecified osteoarthritis, unspecified site: Secondary | ICD-10-CM | POA: Diagnosis not present

## 2020-12-01 DIAGNOSIS — K219 Gastro-esophageal reflux disease without esophagitis: Secondary | ICD-10-CM

## 2020-12-01 DIAGNOSIS — Z780 Asymptomatic menopausal state: Secondary | ICD-10-CM

## 2020-12-01 LAB — POCT GLYCOSYLATED HEMOGLOBIN (HGB A1C): Hemoglobin A1C: 5.9 % — AB (ref 4.0–5.6)

## 2020-12-01 MED ORDER — FAMOTIDINE 20 MG PO TABS: ORAL_TABLET | ORAL | 1 refills | Status: DC

## 2020-12-01 NOTE — Assessment & Plan Note (Signed)
Managed on Flovent and Albuterol She will continue to see pulmonology, will follow

## 2020-12-01 NOTE — Assessment & Plan Note (Signed)
Lipid profile reviewed Encouraged her to consume a low saturated fat diet Statin intolerant due to myalgias

## 2020-12-01 NOTE — Assessment & Plan Note (Signed)
Avoid foods that trigger reflux Encourage weight loss as this can help reduce reflux symptoms Continue Famotidine, refilled today

## 2020-12-01 NOTE — Assessment & Plan Note (Addendum)
Managed on Montelukast, Flovent and Albuterol She will continue to see pulmonology, will follow

## 2020-12-01 NOTE — Assessment & Plan Note (Signed)
Managed on Diclofenac Encouraged regular physical activity Kidney function reviewed-normal
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
## 2020-12-01 NOTE — Assessment & Plan Note (Signed)
POCT A1c today No urine microalbumin secondary to ARB therapy Encouraged her to consume a low-carb diet and exercise weight loss No medications Foot exam today Encourage routine eye exams Flu, Pneumovax, Prevnar UTD Encouraged her to get her fourth COVID booster

## 2020-12-01 NOTE — Addendum Note (Signed)
Addended by: Wilson Singer on: 12/01/2020 08:46 AM   Modules accepted: Orders

## 2020-12-01 NOTE — Assessment & Plan Note (Signed)
Continue Calcium and Vitamin D OTC Encouraged daily weightbearing exercise 

## 2020-12-01 NOTE — Patient Instructions (Signed)

## 2020-12-01 NOTE — Assessment & Plan Note (Signed)
Controlled on Losartan Reinforced DASH diet and exercise for weight loss C-Met reviewed

## 2020-12-01 NOTE — Assessment & Plan Note (Signed)
Statin intolerant 

## 2020-12-01 NOTE — Assessment & Plan Note (Signed)
Asymptomatic Statin intolerant She is currently taking Aspirin 3 times weekly

## 2020-12-08 DIAGNOSIS — M955 Acquired deformity of pelvis: Secondary | ICD-10-CM | POA: Diagnosis not present

## 2020-12-08 DIAGNOSIS — M9905 Segmental and somatic dysfunction of pelvic region: Secondary | ICD-10-CM | POA: Diagnosis not present

## 2020-12-08 DIAGNOSIS — M9903 Segmental and somatic dysfunction of lumbar region: Secondary | ICD-10-CM | POA: Diagnosis not present

## 2020-12-08 DIAGNOSIS — M5416 Radiculopathy, lumbar region: Secondary | ICD-10-CM | POA: Diagnosis not present

## 2021-01-06 DIAGNOSIS — M955 Acquired deformity of pelvis: Secondary | ICD-10-CM | POA: Diagnosis not present

## 2021-01-06 DIAGNOSIS — M9905 Segmental and somatic dysfunction of pelvic region: Secondary | ICD-10-CM | POA: Diagnosis not present

## 2021-01-06 DIAGNOSIS — M5416 Radiculopathy, lumbar region: Secondary | ICD-10-CM | POA: Diagnosis not present

## 2021-01-06 DIAGNOSIS — M9903 Segmental and somatic dysfunction of lumbar region: Secondary | ICD-10-CM | POA: Diagnosis not present

## 2021-01-13 ENCOUNTER — Other Ambulatory Visit: Payer: Self-pay

## 2021-01-13 DIAGNOSIS — M1990 Unspecified osteoarthritis, unspecified site: Secondary | ICD-10-CM

## 2021-01-14 MED ORDER — DICLOFENAC SODIUM 75 MG PO TBEC: DELAYED_RELEASE_TABLET | ORAL | 1 refills | Status: DC

## 2021-01-25 ENCOUNTER — Encounter: Payer: Self-pay | Admitting: Internal Medicine

## 2021-02-01 ENCOUNTER — Other Ambulatory Visit: Payer: Self-pay | Admitting: Internal Medicine

## 2021-02-01 DIAGNOSIS — I10 Essential (primary) hypertension: Secondary | ICD-10-CM

## 2021-02-01 DIAGNOSIS — J302 Other seasonal allergic rhinitis: Secondary | ICD-10-CM

## 2021-02-03 DIAGNOSIS — M955 Acquired deformity of pelvis: Secondary | ICD-10-CM | POA: Diagnosis not present

## 2021-02-03 DIAGNOSIS — M9905 Segmental and somatic dysfunction of pelvic region: Secondary | ICD-10-CM | POA: Diagnosis not present

## 2021-02-03 DIAGNOSIS — M9903 Segmental and somatic dysfunction of lumbar region: Secondary | ICD-10-CM | POA: Diagnosis not present

## 2021-02-03 DIAGNOSIS — M5416 Radiculopathy, lumbar region: Secondary | ICD-10-CM | POA: Diagnosis not present

## 2021-02-10 DIAGNOSIS — M9903 Segmental and somatic dysfunction of lumbar region: Secondary | ICD-10-CM | POA: Diagnosis not present

## 2021-02-10 DIAGNOSIS — M955 Acquired deformity of pelvis: Secondary | ICD-10-CM | POA: Diagnosis not present

## 2021-02-10 DIAGNOSIS — M5416 Radiculopathy, lumbar region: Secondary | ICD-10-CM | POA: Diagnosis not present

## 2021-02-10 DIAGNOSIS — M9905 Segmental and somatic dysfunction of pelvic region: Secondary | ICD-10-CM | POA: Diagnosis not present

## 2021-02-12 DIAGNOSIS — M9905 Segmental and somatic dysfunction of pelvic region: Secondary | ICD-10-CM | POA: Diagnosis not present

## 2021-02-12 DIAGNOSIS — M955 Acquired deformity of pelvis: Secondary | ICD-10-CM | POA: Diagnosis not present

## 2021-02-12 DIAGNOSIS — M5416 Radiculopathy, lumbar region: Secondary | ICD-10-CM | POA: Diagnosis not present

## 2021-02-12 DIAGNOSIS — M9903 Segmental and somatic dysfunction of lumbar region: Secondary | ICD-10-CM | POA: Diagnosis not present

## 2021-02-23 DIAGNOSIS — M955 Acquired deformity of pelvis: Secondary | ICD-10-CM | POA: Diagnosis not present

## 2021-02-23 DIAGNOSIS — M9903 Segmental and somatic dysfunction of lumbar region: Secondary | ICD-10-CM | POA: Diagnosis not present

## 2021-02-23 DIAGNOSIS — M5416 Radiculopathy, lumbar region: Secondary | ICD-10-CM | POA: Diagnosis not present

## 2021-02-23 DIAGNOSIS — M9905 Segmental and somatic dysfunction of pelvic region: Secondary | ICD-10-CM | POA: Diagnosis not present

## 2021-03-02 DIAGNOSIS — J4520 Mild intermittent asthma, uncomplicated: Secondary | ICD-10-CM | POA: Diagnosis not present

## 2021-03-02 DIAGNOSIS — M533 Sacrococcygeal disorders, not elsewhere classified: Secondary | ICD-10-CM | POA: Diagnosis not present

## 2021-03-02 DIAGNOSIS — I700 Atherosclerosis of aorta: Secondary | ICD-10-CM | POA: Diagnosis not present

## 2021-03-02 DIAGNOSIS — D869 Sarcoidosis, unspecified: Secondary | ICD-10-CM | POA: Diagnosis not present

## 2021-03-02 DIAGNOSIS — J984 Other disorders of lung: Secondary | ICD-10-CM | POA: Diagnosis not present

## 2021-03-02 DIAGNOSIS — R599 Enlarged lymph nodes, unspecified: Secondary | ICD-10-CM | POA: Diagnosis not present

## 2021-03-09 DIAGNOSIS — M955 Acquired deformity of pelvis: Secondary | ICD-10-CM | POA: Diagnosis not present

## 2021-03-09 DIAGNOSIS — M5416 Radiculopathy, lumbar region: Secondary | ICD-10-CM | POA: Diagnosis not present

## 2021-03-09 DIAGNOSIS — M9903 Segmental and somatic dysfunction of lumbar region: Secondary | ICD-10-CM | POA: Diagnosis not present

## 2021-03-09 DIAGNOSIS — M9905 Segmental and somatic dysfunction of pelvic region: Secondary | ICD-10-CM | POA: Diagnosis not present

## 2021-03-23 DIAGNOSIS — M9905 Segmental and somatic dysfunction of pelvic region: Secondary | ICD-10-CM | POA: Diagnosis not present

## 2021-03-23 DIAGNOSIS — M9903 Segmental and somatic dysfunction of lumbar region: Secondary | ICD-10-CM | POA: Diagnosis not present

## 2021-03-23 DIAGNOSIS — M5416 Radiculopathy, lumbar region: Secondary | ICD-10-CM | POA: Diagnosis not present

## 2021-03-23 DIAGNOSIS — M955 Acquired deformity of pelvis: Secondary | ICD-10-CM | POA: Diagnosis not present

## 2021-04-13 DIAGNOSIS — M9903 Segmental and somatic dysfunction of lumbar region: Secondary | ICD-10-CM | POA: Diagnosis not present

## 2021-04-13 DIAGNOSIS — M5416 Radiculopathy, lumbar region: Secondary | ICD-10-CM | POA: Diagnosis not present

## 2021-04-13 DIAGNOSIS — M9905 Segmental and somatic dysfunction of pelvic region: Secondary | ICD-10-CM | POA: Diagnosis not present

## 2021-04-13 DIAGNOSIS — M955 Acquired deformity of pelvis: Secondary | ICD-10-CM | POA: Diagnosis not present

## 2021-04-21 DIAGNOSIS — R600 Localized edema: Secondary | ICD-10-CM | POA: Diagnosis not present

## 2021-04-21 DIAGNOSIS — R0602 Shortness of breath: Secondary | ICD-10-CM | POA: Diagnosis not present

## 2021-04-21 DIAGNOSIS — D860 Sarcoidosis of lung: Secondary | ICD-10-CM | POA: Diagnosis not present

## 2021-04-21 DIAGNOSIS — E782 Mixed hyperlipidemia: Secondary | ICD-10-CM | POA: Diagnosis not present

## 2021-04-21 DIAGNOSIS — E119 Type 2 diabetes mellitus without complications: Secondary | ICD-10-CM | POA: Diagnosis not present

## 2021-04-21 DIAGNOSIS — I10 Essential (primary) hypertension: Secondary | ICD-10-CM | POA: Diagnosis not present

## 2021-04-21 DIAGNOSIS — I700 Atherosclerosis of aorta: Secondary | ICD-10-CM | POA: Diagnosis not present

## 2021-04-21 DIAGNOSIS — J4520 Mild intermittent asthma, uncomplicated: Secondary | ICD-10-CM | POA: Diagnosis not present

## 2021-04-21 DIAGNOSIS — K219 Gastro-esophageal reflux disease without esophagitis: Secondary | ICD-10-CM | POA: Diagnosis not present

## 2021-04-27 ENCOUNTER — Other Ambulatory Visit: Payer: Self-pay | Admitting: Internal Medicine

## 2021-04-27 DIAGNOSIS — I10 Essential (primary) hypertension: Secondary | ICD-10-CM

## 2021-04-27 DIAGNOSIS — J302 Other seasonal allergic rhinitis: Secondary | ICD-10-CM

## 2021-04-28 NOTE — Telephone Encounter (Signed)
Requested Prescriptions  Pending Prescriptions Disp Refills  . montelukast (SINGULAIR) 10 MG tablet [Pharmacy Med Name: MONTELUKAST 10MG  TABLETS] 90 tablet 0    Sig: TAKE 1 TABLET(10 MG) BY MOUTH DAILY WITH SUPPER     Pulmonology:  Leukotriene Inhibitors Passed - 04/27/2021  5:05 PM      Passed - Valid encounter within last 12 months    Recent Outpatient Visits          4 months ago Gastroesophageal reflux disease without esophagitis   [facility], Coralie Keens, NP   11 months ago Annual physical exam   [facility], Lupita Raider, FNP   1 year ago Nail fungus   [facility], Lupita Raider, FNP   1 year ago Nail fungus   [facility], Lupita Raider, FNP   1 year ago Controlled type 2 diabetes mellitus without complication, without long-term current use of insulin ([place])   [facility], Lupita Raider, FNP      Future Appointments            In 1 month Grant, Coralie Keens, NP [facility], [place]   In 5 months  [facility], [facility]           . losartan (COZAAR) 25 MG tablet [Pharmacy Med Name: LOSARTAN 25MG  TABLETS] 90 tablet 0    Sig: TAKE 1 TABLET(25 MG) BY MOUTH DAILY     Cardiovascular:  Angiotensin Receptor Blockers Failed - 04/27/2021  5:05 PM      Failed - Cr in normal range and within 180 days    Creat  Date Value Ref Range Status  05/19/2020 0.61 0.60 - 0.93 mg/dL Final    Comment:    For patients >15 years of age, the reference limit for Creatinine is approximately 13% higher for people identified as African-American. .          Failed - K in normal range and within 180 days    Potassium  Date Value Ref Range Status  05/19/2020 4.5 3.5 - 5.3 mmol/L Final         Passed - Patient is not pregnant      Passed - Last BP in normal range    BP Readings from Last 1 Encounters:  12/01/20 (!) 120/59         Passed - Valid encounter within last 6 months     Recent Outpatient Visits          4 months ago Gastroesophageal reflux disease without esophagitis   [place], Coralie Keens, NP   11 months ago Annual physical exam   [facility], Lupita Raider, FNP   1 year ago Nail fungus   [place], FNP   1 year ago Nail fungus   [facility], Lupita Raider, FNP   1 year ago Controlled type 2 diabetes mellitus without complication, without long-term current use of insulin ([place])   [facility], Lupita Raider, FNP      Future Appointments            In 1 month Baity, Coralie Keens, NP [facility] [place], [place]   In 5 months  [facility], [facility]

## 2021-05-06 ENCOUNTER — Other Ambulatory Visit: Payer: Self-pay

## 2021-05-06 ENCOUNTER — Ambulatory Visit
Admission: EM | Admit: 2021-05-06 | Discharge: 2021-05-06 | Disposition: A | Discharge status: Home / Self Care | Payer: Medicare PPO | Attending: Internal Medicine | Admitting: Internal Medicine

## 2021-05-06 ENCOUNTER — Encounter: Payer: Self-pay | Admitting: Licensed Clinical Social Worker

## 2021-05-06 DIAGNOSIS — J209 Acute bronchitis, unspecified: Secondary | ICD-10-CM | POA: Diagnosis not present

## 2021-05-06 MED ORDER — PREDNISONE 10 MG (21) PO TBPK: ORAL_TABLET | Freq: Every day | ORAL | 0 refills | Status: DC

## 2021-05-06 NOTE — ED Triage Notes (Signed)
Pt c/o sore throat, nasal congestion, sinus headache x 1week. Covid test this morning at home was negative.
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
## 2021-05-06 NOTE — Discharge Instructions (Addendum)
Please take medications as prescribed Increase oral fluid intake Continue nasal spray use If you have worsening symptoms please return to urgent care to be reevaluated.

## 2021-05-10 ENCOUNTER — Other Ambulatory Visit: Payer: Self-pay | Admitting: Internal Medicine

## 2021-05-10 DIAGNOSIS — K219 Gastro-esophageal reflux disease without esophagitis: Secondary | ICD-10-CM

## 2021-05-10 NOTE — Telephone Encounter (Signed)
Requested Prescriptions  Pending Prescriptions Disp Refills  . famotidine (PEPCID) 20 MG tablet [Pharmacy Med Name: FAMOTIDINE 20MG  TABLETS] 90 tablet 1    Sig: TAKE 1 TABLET BY MOUTH DAILY AS NEEDED FOR HEARTBURN OR INDIGESTION     Gastroenterology:  H2 Antagonists Passed - 05/10/2021  7:08 AM      Passed - Valid encounter within last 12 months    Recent Outpatient Visits          5 months ago Gastroesophageal reflux disease without esophagitis   [facility] [place], Coralie Keens, NP   11 months ago Annual physical exam   [facility], Lupita Raider, FNP   1 year ago Nail fungus   [place], FNP   1 year ago Nail fungus   [facility], Lupita Raider, FNP   1 year ago Controlled type 2 diabetes mellitus without complication, without long-term current use of insulin ([place])   [facility], Lupita Raider, FNP      Future Appointments            In 3 weeks Baity, Coralie Keens, NP [facility], [place]   In 4 months  [facility], [facility]

## 2021-05-11 DIAGNOSIS — M9905 Segmental and somatic dysfunction of pelvic region: Secondary | ICD-10-CM | POA: Diagnosis not present

## 2021-05-11 DIAGNOSIS — M5416 Radiculopathy, lumbar region: Secondary | ICD-10-CM | POA: Diagnosis not present

## 2021-05-11 DIAGNOSIS — M955 Acquired deformity of pelvis: Secondary | ICD-10-CM | POA: Diagnosis not present

## 2021-05-11 DIAGNOSIS — M9903 Segmental and somatic dysfunction of lumbar region: Secondary | ICD-10-CM | POA: Diagnosis not present

## 2021-05-19 DIAGNOSIS — D869 Sarcoidosis, unspecified: Secondary | ICD-10-CM | POA: Diagnosis not present

## 2021-05-19 DIAGNOSIS — J209 Acute bronchitis, unspecified: Secondary | ICD-10-CM | POA: Diagnosis not present

## 2021-05-19 DIAGNOSIS — J310 Chronic rhinitis: Secondary | ICD-10-CM | POA: Diagnosis not present

## 2021-05-28 ENCOUNTER — Ambulatory Visit: Payer: Medicare PPO | Admitting: Podiatry

## 2021-05-28 ENCOUNTER — Other Ambulatory Visit: Payer: Self-pay

## 2021-05-28 DIAGNOSIS — L989 Disorder of the skin and subcutaneous tissue, unspecified: Secondary | ICD-10-CM | POA: Diagnosis not present
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
## 2021-06-01 ENCOUNTER — Ambulatory Visit: Payer: Medicare PPO | Admitting: Internal Medicine

## 2021-06-01 ENCOUNTER — Other Ambulatory Visit: Payer: Self-pay

## 2021-06-01 ENCOUNTER — Encounter: Payer: Self-pay | Admitting: Internal Medicine

## 2021-06-01 VITALS — BP 130/66 | HR 80 | Temp 97.3°F | Resp 17 | Ht 62.0 in | Wt 173.2 lb

## 2021-06-01 DIAGNOSIS — M791 Myalgia, unspecified site: Secondary | ICD-10-CM | POA: Diagnosis not present

## 2021-06-01 DIAGNOSIS — Z6831 Body mass index (BMI) 31.0-31.9, adult: Secondary | ICD-10-CM | POA: Diagnosis not present

## 2021-06-01 DIAGNOSIS — E782 Mixed hyperlipidemia: Secondary | ICD-10-CM | POA: Diagnosis not present

## 2021-06-01 DIAGNOSIS — I700 Atherosclerosis of aorta: Secondary | ICD-10-CM | POA: Diagnosis not present

## 2021-06-01 DIAGNOSIS — I10 Essential (primary) hypertension: Secondary | ICD-10-CM

## 2021-06-01 DIAGNOSIS — Z6829 Body mass index (BMI) 29.0-29.9, adult: Secondary | ICD-10-CM | POA: Insufficient documentation

## 2021-06-01 DIAGNOSIS — E119 Type 2 diabetes mellitus without complications: Secondary | ICD-10-CM | POA: Diagnosis not present

## 2021-06-01 DIAGNOSIS — T466X5A Adverse effect of antihyperlipidemic and antiarteriosclerotic drugs, initial encounter: Secondary | ICD-10-CM

## 2021-06-01 DIAGNOSIS — R052 Subacute cough: Secondary | ICD-10-CM

## 2021-06-01 DIAGNOSIS — Z1231 Encounter for screening mammogram for malignant neoplasm of breast: Secondary | ICD-10-CM

## 2021-06-01 DIAGNOSIS — E6609 Other obesity due to excess calories: Secondary | ICD-10-CM | POA: Insufficient documentation

## 2021-06-01 MED ORDER — BENZONATATE 200 MG PO CAPS: 200.0000 mg | ORAL_CAPSULE | Freq: Three times a day (TID) | ORAL | 0 refills | Status: DC | PRN

## 2021-06-01 MED ORDER — HYDROCODONE BIT-HOMATROP MBR 5-1.5 MG/5ML PO SOLN: 5.0000 mL | Freq: Three times a day (TID) | ORAL | 0 refills | Status: DC | PRN

## 2021-06-01 NOTE — Assessment & Plan Note (Signed)
Encouraged diet and exercise for weight loss ?

## 2021-06-01 NOTE — Assessment & Plan Note (Signed)
Controlled on Losartan Reinforced DASH diet and exercise for weight loss CMET today 

## 2021-06-01 NOTE — Assessment & Plan Note (Signed)
CMET and lipid profile today Encouraged her to consume a low fat diet Continue ASA

## 2021-06-01 NOTE — Assessment & Plan Note (Signed)
CMET and lipid profile today Encouraged her to consume a low fat diet 
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
## 2021-06-01 NOTE — Patient Instructions (Signed)

## 2021-06-01 NOTE — Assessment & Plan Note (Signed)
A1C today No urine microalbumin secondary to ARB therapy Encouraged her to consume a low carb diet and exercise for weight loss Encouraged routine foot exams Her eye exam is scheduled Flu, pneumonia vaccines UTD Encouraged her to get her Covid booster

## 2021-06-02 LAB — LIPID PANEL
Cholesterol: 218 mg/dL — ABNORMAL HIGH (ref <200)
HDL: 64 mg/dL (ref >=50)
LDL Cholesterol (Calc): 134 mg/dL (calc) — ABNORMAL HIGH
Non-HDL Cholesterol (Calc): 154 mg/dL (calc) — ABNORMAL HIGH (ref <130)
Total CHOL/HDL Ratio: 3.4 (calc) (ref <5.0)
Triglycerides: 101 mg/dL (ref <150)

## 2021-06-02 LAB — HEMOGLOBIN A1C
Hgb A1c MFr Bld: 6 % of total Hgb — ABNORMAL HIGH (ref <5.7)
Mean Plasma Glucose: 126 mg/dL
eAG (mmol/L): 7 mmol/L

## 2021-06-02 LAB — COMPLETE METABOLIC PANEL WITH GFR
AG Ratio: 1.7 (calc) (ref 1.0–2.5)
ALT: 16 U/L (ref 6–29)
AST: 19 U/L (ref 10–35)
Albumin: 4 g/dL (ref 3.6–5.1)
Alkaline phosphatase (APISO): 64 U/L (ref 37–153)
BUN: 21 mg/dL (ref 7–25)
CO2: 28 mmol/L (ref 20–32)
Calcium: 9.4 mg/dL (ref 8.6–10.4)
Chloride: 103 mmol/L (ref 98–110)
Creat: 0.67 mg/dL (ref 0.60–1.00)
Globulin: 2.3 g/dL (calc) (ref 1.9–3.7)
Glucose, Bld: 94 mg/dL (ref 65–99)
Potassium: 4.7 mmol/L (ref 3.5–5.3)
Sodium: 140 mmol/L (ref 135–146)
Total Bilirubin: 0.4 mg/dL (ref 0.2–1.2)
Total Protein: 6.3 g/dL (ref 6.1–8.1)
eGFR: 93 mL/min/{1.73_m2} (ref >=60)

## 2021-06-05 IMAGING — DX DG CHEST 1V
1 series · 1 of 1 positions shown · non-contrast
Comparison: CT chest 01/22/2018

CLINICAL DATA: Shortness of breath, fever, and chills. Active
coronavirus infection.

EXAM:
CHEST  1 VIEW

[chest ap]
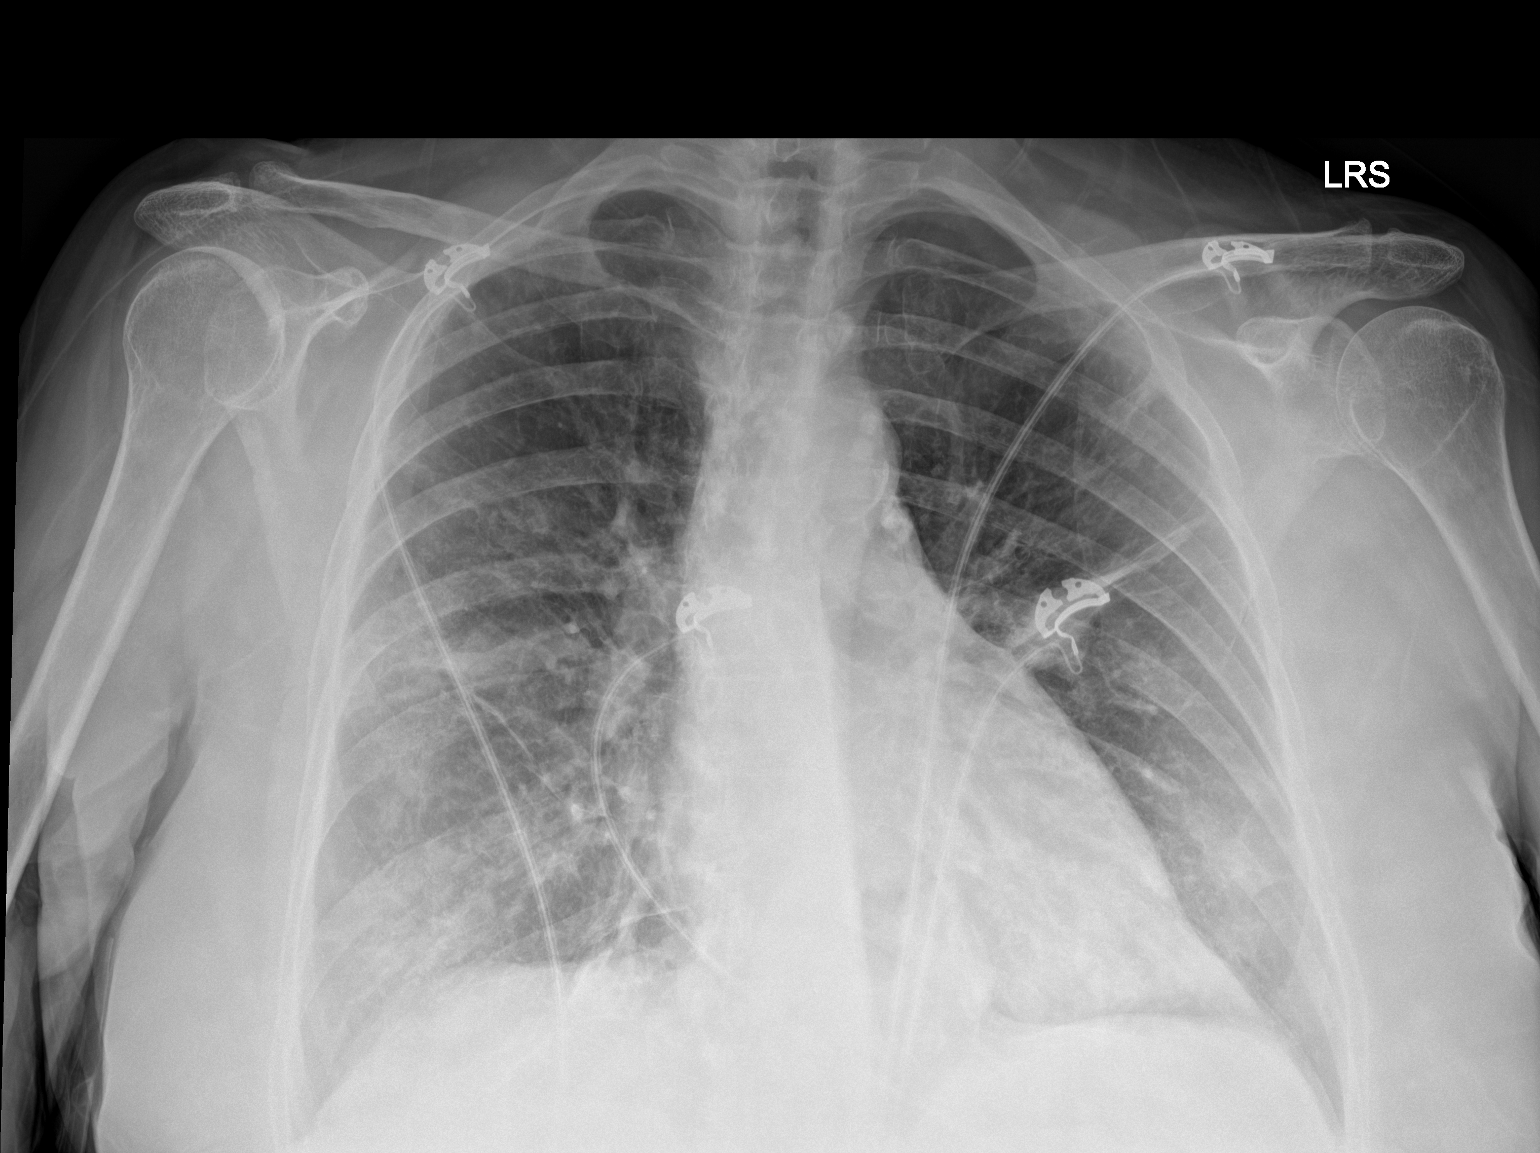

[1 of 1 positions shown; findings below may reference images not displayed]

FINDINGS: Hazy opacities in the mid lungs in the lung bases are new and favor
atypical bilateral pneumonia given the clinical context. There is
also some stable scarring in the left mid lung. Calcified lymph node
in the AP window. Previous right upper lobe nodularity is less
striking on today's exam, and reportedly by prior biopsy represented
sarcoid.

Heart size within normal limits. Atherosclerotic calcification of
the aortic arch.

Small hiatal hernia.
IMPRESSION: 1. New bilateral mid and lower lung zone airspace opacities favoring
atypical bilateral pneumonia.
2. Small hiatal hernia.
3. Old granulomatous disease.

## 2021-06-07 DIAGNOSIS — E119 Type 2 diabetes mellitus without complications: Secondary | ICD-10-CM | POA: Diagnosis not present

## 2021-06-07 DIAGNOSIS — M955 Acquired deformity of pelvis: Secondary | ICD-10-CM | POA: Diagnosis not present

## 2021-06-07 DIAGNOSIS — Z0100 Encounter for examination of eyes and vision without abnormal findings: Secondary | ICD-10-CM | POA: Diagnosis not present

## 2021-06-07 DIAGNOSIS — M9903 Segmental and somatic dysfunction of lumbar region: Secondary | ICD-10-CM | POA: Diagnosis not present

## 2021-06-07 DIAGNOSIS — M5416 Radiculopathy, lumbar region: Secondary | ICD-10-CM | POA: Diagnosis not present

## 2021-06-07 DIAGNOSIS — M9905 Segmental and somatic dysfunction of pelvic region: Secondary | ICD-10-CM | POA: Diagnosis not present

## 2021-06-07 LAB — HM DIABETES EYE EXAM

## 2021-06-14 ENCOUNTER — Telehealth: Payer: Self-pay | Admitting: Internal Medicine

## 2021-06-14 NOTE — Telephone Encounter (Signed)
Pt is calling to have referral placed at [facility] For her Mammogram. Please advise Cb- [phone]

## 2021-06-15 NOTE — Telephone Encounter (Signed)
This order was placed 11/15

## 2021-06-16 NOTE — Telephone Encounter (Signed)
The pt was notified that she just need to give Mary Burke a call and schedule her screening mammogram at her convenience. She verbalize understanding.

## 2021-06-22 ENCOUNTER — Ambulatory Visit
Admission: RE | Admit: 2021-06-22 | Discharge: 2021-06-22 | Disposition: A | Discharge status: Home / Self Care | Payer: Medicare PPO | Source: Ambulatory Visit | Attending: Internal Medicine | Admitting: Internal Medicine

## 2021-06-22 ENCOUNTER — Other Ambulatory Visit: Payer: Self-pay

## 2021-06-22 DIAGNOSIS — Z1231 Encounter for screening mammogram for malignant neoplasm of breast: Secondary | ICD-10-CM | POA: Insufficient documentation

## 2021-07-05 ENCOUNTER — Other Ambulatory Visit: Payer: Self-pay | Admitting: Internal Medicine

## 2021-07-05 DIAGNOSIS — M1990 Unspecified osteoarthritis, unspecified site: Secondary | ICD-10-CM

## 2021-07-05 NOTE — Telephone Encounter (Signed)
Requested medication (s) are due for refill today: yes  Requested medication (s) are on the active medication list: yes  Last refill:  04/10/21  Future visit scheduled: no, chart states pt only taking once a day, however, request is coming in at correct time if taking twice daily. Notes to clinic:  hgb out of date and chart states pt only taking once a day., please assess.   Requested Prescriptions  Pending Prescriptions Disp Refills   diclofenac (VOLTAREN) 75 MG EC tablet [Pharmacy Med Name: DICLOFENAC SODIUM 75MG  DR TABLETS] 180 tablet 1    Sig: TAKE 1 TABLET(75 MG) BY MOUTH TWICE DAILY     Analgesics:  NSAIDS Failed - 07/05/2021 10:45 AM      Failed - HGB in normal range and within 360 days    Hemoglobin  Date Value Ref Range Status  05/19/2020 15.0 11.7 - 15.5 g/dL Final  10/27/2015 13.5 11.1 - 15.9 g/dL Final          Passed - Cr in normal range and within 360 days    Creat  Date Value Ref Range Status  06/01/2021 0.67 0.60 - 1.00 mg/dL Final          Passed - Patient is not pregnant      Passed - Valid encounter within last 12 months    Recent Outpatient Visits           1 month ago Controlled type 2 diabetes mellitus without complication, without long-term current use of insulin ([place])   [facility], Coralie Keens, NP   7 months ago Gastroesophageal reflux disease without esophagitis   [facility] [place], Coralie Keens, NP   1 year ago Annual physical exam   [facility], Lupita Raider, FNP   1 year ago Nail fungus   [facility], Lupita Raider, FNP   1 year ago Nail fungus   [facility], Lupita Raider, FNP       Future Appointments             In 3 months [facility], [facility]

## 2021-07-06 DIAGNOSIS — M9905 Segmental and somatic dysfunction of pelvic region: Secondary | ICD-10-CM | POA: Diagnosis not present

## 2021-07-06 DIAGNOSIS — M955 Acquired deformity of pelvis: Secondary | ICD-10-CM | POA: Diagnosis not present

## 2021-07-06 DIAGNOSIS — M5416 Radiculopathy, lumbar region: Secondary | ICD-10-CM | POA: Diagnosis not present

## 2021-07-06 DIAGNOSIS — M9903 Segmental and somatic dysfunction of lumbar region: Secondary | ICD-10-CM | POA: Diagnosis not present

## 2021-07-22 ENCOUNTER — Other Ambulatory Visit: Payer: Self-pay | Admitting: Internal Medicine

## 2021-07-22 DIAGNOSIS — J302 Other seasonal allergic rhinitis: Secondary | ICD-10-CM

## 2021-07-22 DIAGNOSIS — I10 Essential (primary) hypertension: Secondary | ICD-10-CM

## 2021-07-23 NOTE — Telephone Encounter (Signed)
Requested Prescriptions  Pending Prescriptions Disp Refills   montelukast (SINGULAIR) 10 MG tablet [Pharmacy Med Name: MONTELUKAST 10MG  TABLETS] 90 tablet 0    Sig: TAKE 1 TABLET(10 MG) BY MOUTH DAILY WITH SUPPER     Pulmonology:  Leukotriene Inhibitors Passed - 07/22/2021 12:57 PM      Passed - Valid encounter within last 12 months    Recent Outpatient Visits          1 month ago Controlled type 2 diabetes mellitus without complication, without long-term current use of insulin ([place])   [facility] [place], Coralie Keens, NP   7 months ago Gastroesophageal reflux disease without esophagitis   [facility] [place], [place] W, NP   1 year ago Annual physical exam   [facility], Lupita Raider, FNP   1 year ago Nail fungus   [facility], Lupita Raider, FNP   1 year ago Nail fungus   [facility], Lupita Raider, FNP      Future Appointments            In 2 months [facility], [facility]             losartan (COZAAR) 25 MG tablet [Pharmacy Med Name: LOSARTAN 25MG  TABLETS] 90 tablet 0    Sig: TAKE 1 TABLET(25 MG) BY MOUTH DAILY     Cardiovascular:  Angiotensin Receptor Blockers Passed - 07/22/2021 12:57 PM      Passed - Cr in normal range and within 180 days    Creat  Date Value Ref Range Status  06/01/2021 0.67 0.60 - 1.00 mg/dL Final         Passed - K in normal range and within 180 days    Potassium  Date Value Ref Range Status  06/01/2021 4.7 3.5 - 5.3 mmol/L Final         Passed - Patient is not pregnant      Passed - Last BP in normal range    BP Readings from Last 1 Encounters:  06/01/21 130/66         Passed - Valid encounter within last 6 months    Recent Outpatient Visits          1 month ago Controlled type 2 diabetes mellitus without complication, without long-term current use of insulin ([place])   [facility] [place], Coralie Keens, NP   7 months ago Gastroesophageal  reflux disease without esophagitis   [facility] [place], Coralie Keens, NP   1 year ago Annual physical exam   [facility], Lupita Raider, FNP   1 year ago Nail fungus   [facility], Lupita Raider, FNP   1 year ago Nail fungus   [facility], Lupita Raider, FNP      Future Appointments            In 2 months [facility], [facility]

## 2021-08-03 DIAGNOSIS — M5416 Radiculopathy, lumbar region: Secondary | ICD-10-CM | POA: Diagnosis not present

## 2021-08-03 DIAGNOSIS — M9903 Segmental and somatic dysfunction of lumbar region: Secondary | ICD-10-CM | POA: Diagnosis not present

## 2021-08-03 DIAGNOSIS — M9905 Segmental and somatic dysfunction of pelvic region: Secondary | ICD-10-CM | POA: Diagnosis not present

## 2021-08-03 DIAGNOSIS — M955 Acquired deformity of pelvis: Secondary | ICD-10-CM | POA: Diagnosis not present

## 2021-08-14 ENCOUNTER — Encounter: Payer: Self-pay | Admitting: Emergency Medicine

## 2021-08-14 ENCOUNTER — Other Ambulatory Visit: Payer: Self-pay

## 2021-08-14 ENCOUNTER — Ambulatory Visit
Admission: EM | Admit: 2021-08-14 | Discharge: 2021-08-14 | Disposition: A | Discharge status: Home / Self Care | Payer: Medicare PPO

## 2021-08-14 DIAGNOSIS — H1132 Conjunctival hemorrhage, left eye: Secondary | ICD-10-CM | POA: Diagnosis not present

## 2021-08-14 NOTE — Discharge Instructions (Signed)
You have a ruptured blood vessel on your eye and this will have to resolve on its own.  AS it heals you may experience itching and you can continue to use your allergy eye drops as needed.  Avoid rubbing your eye.  You can also apply cool compresses as needed for any discomfort. .  If the redness spreads you need to see your eye doctor.
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
## 2021-08-14 NOTE — ED Triage Notes (Signed)
Patient c/o redness in her left eye that started yesterday.  Patient denies any pain.  Patient denies any changes with her vision.

## 2021-08-25 DIAGNOSIS — J4520 Mild intermittent asthma, uncomplicated: Secondary | ICD-10-CM | POA: Diagnosis not present

## 2021-08-25 DIAGNOSIS — Z01818 Encounter for other preprocedural examination: Secondary | ICD-10-CM | POA: Diagnosis not present

## 2021-08-25 DIAGNOSIS — D860 Sarcoidosis of lung: Secondary | ICD-10-CM | POA: Diagnosis not present

## 2021-08-31 DIAGNOSIS — M9905 Segmental and somatic dysfunction of pelvic region: Secondary | ICD-10-CM | POA: Diagnosis not present

## 2021-08-31 DIAGNOSIS — M955 Acquired deformity of pelvis: Secondary | ICD-10-CM | POA: Diagnosis not present

## 2021-08-31 DIAGNOSIS — M5416 Radiculopathy, lumbar region: Secondary | ICD-10-CM | POA: Diagnosis not present

## 2021-08-31 DIAGNOSIS — M9903 Segmental and somatic dysfunction of lumbar region: Secondary | ICD-10-CM | POA: Diagnosis not present

## 2021-09-07 DIAGNOSIS — M5416 Radiculopathy, lumbar region: Secondary | ICD-10-CM | POA: Diagnosis not present

## 2021-09-07 DIAGNOSIS — M9903 Segmental and somatic dysfunction of lumbar region: Secondary | ICD-10-CM | POA: Diagnosis not present

## 2021-09-07 DIAGNOSIS — M9905 Segmental and somatic dysfunction of pelvic region: Secondary | ICD-10-CM | POA: Diagnosis not present

## 2021-09-07 DIAGNOSIS — M955 Acquired deformity of pelvis: Secondary | ICD-10-CM | POA: Diagnosis not present

## 2021-09-14 DIAGNOSIS — M955 Acquired deformity of pelvis: Secondary | ICD-10-CM | POA: Diagnosis not present

## 2021-09-14 DIAGNOSIS — M9905 Segmental and somatic dysfunction of pelvic region: Secondary | ICD-10-CM | POA: Diagnosis not present

## 2021-09-14 DIAGNOSIS — M9903 Segmental and somatic dysfunction of lumbar region: Secondary | ICD-10-CM | POA: Diagnosis not present

## 2021-09-14 DIAGNOSIS — M5416 Radiculopathy, lumbar region: Secondary | ICD-10-CM | POA: Diagnosis not present

## 2021-09-21 DIAGNOSIS — M9905 Segmental and somatic dysfunction of pelvic region: Secondary | ICD-10-CM | POA: Diagnosis not present

## 2021-09-21 DIAGNOSIS — M5416 Radiculopathy, lumbar region: Secondary | ICD-10-CM | POA: Diagnosis not present

## 2021-09-21 DIAGNOSIS — M955 Acquired deformity of pelvis: Secondary | ICD-10-CM | POA: Diagnosis not present

## 2021-09-21 DIAGNOSIS — M9903 Segmental and somatic dysfunction of lumbar region: Secondary | ICD-10-CM | POA: Diagnosis not present

## 2021-10-05 ENCOUNTER — Ambulatory Visit (INDEPENDENT_AMBULATORY_CARE_PROVIDER_SITE_OTHER): Payer: Medicare PPO

## 2021-10-05 ENCOUNTER — Other Ambulatory Visit: Payer: Self-pay

## 2021-10-05 ENCOUNTER — Ambulatory Visit: Payer: Medicare PPO

## 2021-10-05 DIAGNOSIS — M5416 Radiculopathy, lumbar region: Secondary | ICD-10-CM | POA: Diagnosis not present

## 2021-10-05 DIAGNOSIS — M1990 Unspecified osteoarthritis, unspecified site: Secondary | ICD-10-CM

## 2021-10-05 DIAGNOSIS — M955 Acquired deformity of pelvis: Secondary | ICD-10-CM | POA: Diagnosis not present

## 2021-10-05 DIAGNOSIS — M9905 Segmental and somatic dysfunction of pelvic region: Secondary | ICD-10-CM | POA: Diagnosis not present

## 2021-10-05 DIAGNOSIS — Z0000 Encounter for general adult medical examination without abnormal findings: Secondary | ICD-10-CM

## 2021-10-05 DIAGNOSIS — M9903 Segmental and somatic dysfunction of lumbar region: Secondary | ICD-10-CM | POA: Diagnosis not present

## 2021-10-05 NOTE — Patient Instructions (Signed)
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
## 2021-10-07 ENCOUNTER — Ambulatory Visit: Payer: Medicare PPO

## 2021-10-07 MED ORDER — DICLOFENAC SODIUM 75 MG PO TBEC: 75.0000 mg | DELAYED_RELEASE_TABLET | Freq: Every day | ORAL | 0 refills | Status: DC

## 2021-10-17 ENCOUNTER — Other Ambulatory Visit: Payer: Self-pay | Admitting: Internal Medicine

## 2021-10-17 DIAGNOSIS — I10 Essential (primary) hypertension: Secondary | ICD-10-CM

## 2021-10-17 DIAGNOSIS — J302 Other seasonal allergic rhinitis: Secondary | ICD-10-CM

## 2021-10-19 DIAGNOSIS — I10 Essential (primary) hypertension: Secondary | ICD-10-CM | POA: Diagnosis not present

## 2021-10-19 DIAGNOSIS — I700 Atherosclerosis of aorta: Secondary | ICD-10-CM | POA: Diagnosis not present

## 2021-10-19 DIAGNOSIS — E669 Obesity, unspecified: Secondary | ICD-10-CM | POA: Diagnosis not present

## 2021-10-19 DIAGNOSIS — D860 Sarcoidosis of lung: Secondary | ICD-10-CM | POA: Diagnosis not present

## 2021-10-19 DIAGNOSIS — E782 Mixed hyperlipidemia: Secondary | ICD-10-CM | POA: Diagnosis not present

## 2021-10-19 DIAGNOSIS — E119 Type 2 diabetes mellitus without complications: Secondary | ICD-10-CM | POA: Diagnosis not present

## 2021-10-19 NOTE — Telephone Encounter (Signed)
Requested Prescriptions  ?Pending Prescriptions Disp Refills  ?? losartan (COZAAR) 25 MG tablet [Pharmacy Med Name: LOSARTAN '25MG'$  TABLETS] 90 tablet 0  ?  Sig: TAKE 1 TABLET(25 MG) BY MOUTH DAILY  ?  ? Cardiovascular:  Angiotensin Receptor Blockers Failed - 10/17/2021 11:46 AM  ?  ?  Failed - Last BP in normal range  ?  BP Readings from Last 1 Encounters:  ?08/14/21 (!) 154/62  ?   ?  ?  Passed - Cr in normal range and within 180 days  ?  Creat  ?Date Value Ref Range Status  ?06/01/2021 0.67 0.60 - 1.00 mg/dL Final  ?   ?  ?  Passed - K in normal range and within 180 days  ?  Potassium  ?Date Value Ref Range Status  ?06/01/2021 4.7 3.5 - 5.3 mmol/L Final  ?   ?  ?  Passed - Patient is not pregnant  ?  ?  Passed - Valid encounter within last 6 months  ?  Recent Outpatient Visits   ?      ? 4 months ago Controlled type 2 diabetes mellitus without complication, without long-term current use of insulin ([place])  ? [facility], [place] W, NP  ? 10 months ago Gastroesophageal reflux disease without esophagitis  ? [facility], Coralie Keens, NP  ? 1 year ago Annual physical exam  ? [facility], Lupita Raider, FNP  ? 1 year ago Nail fungus  ? [place], FNP  ? 1 year ago Nail fungus  ? [facility] [place], Lupita Raider, FNP  ?  ?  ? ?  ?  ?  ?? montelukast (SINGULAIR) 10 MG tablet [Pharmacy Med Name: MONTELUKAST '10MG'$  TABLETS] 90 tablet 0  ?  Sig: TAKE 1 TABLET(10 MG) BY MOUTH DAILY WITH SUPPER  ?  ? Pulmonology:  Leukotriene Inhibitors Passed - 10/17/2021 11:46 AM  ?  ?  Passed - Valid encounter within last 12 months  ?  Recent Outpatient Visits   ?      ? 4 months ago Controlled type 2 diabetes mellitus without complication, without long-term current use of insulin ([place])  ? [facility], [place] W, NP  ? 10 months ago Gastroesophageal reflux disease without esophagitis  ? [facility], Coralie Keens, NP  ? 1 year ago Annual physical exam  ? [facility], Lupita Raider, FNP  ? 1 year ago Nail fungus  ? [place], FNP  ? 1 year ago Nail fungus  ? [facility], Lupita Raider, FNP  ?  ?  ? ?  ?  ?  ? ? ?

## 2021-11-06 ENCOUNTER — Other Ambulatory Visit: Payer: Self-pay | Admitting: Internal Medicine

## 2021-11-06 DIAGNOSIS — K219 Gastro-esophageal reflux disease without esophagitis: Secondary | ICD-10-CM

## 2021-11-08 NOTE — Telephone Encounter (Signed)
Requested Prescriptions  ?Pending Prescriptions Disp Refills  ?? famotidine (PEPCID) 20 MG tablet [Pharmacy Med Name: FAMOTIDINE '20MG'$  TABLETS] 90 tablet 1  ?  Sig: TAKE 1 TABLET BY MOUTH DAILY AS NEEDED FOR HEARTBURN OR INDIGESTION  ?  ? Gastroenterology:  H2 Antagonists Passed - 11/06/2021  7:08 AM  ?  ?  Passed - Valid encounter within last 12 months  ?  Recent Outpatient Visits   ?      ? 5 months ago Controlled type 2 diabetes mellitus without complication, without long-term current use of insulin ([place])  ? [facility] [place], [place] W, NP  ? 11 months ago Gastroesophageal reflux disease without esophagitis  ? [facility] [place], Coralie Keens, NP  ? 1 year ago Annual physical exam  ? [facility], Lupita Raider, FNP  ? 1 year ago Nail fungus  ? [place], FNP  ? 1 year ago Nail fungus  ? [facility], Lupita Raider, FNP  ?  ?  ? ?  ?  ?  ? ?

## 2021-12-31 ENCOUNTER — Other Ambulatory Visit: Payer: Self-pay | Admitting: Internal Medicine

## 2021-12-31 DIAGNOSIS — M1990 Unspecified osteoarthritis, unspecified site: Secondary | ICD-10-CM

## 2021-12-31 NOTE — Telephone Encounter (Signed)
Requested medication (s) are due for refill today: yes  Requested medication (s) are on the active medication list: yes  Last refill:  10/07/21 #180 with 0 RF  Future visit scheduled: No, last seen 06/01/21  Notes to clinic:  failed protocol due to CBC from 2021, please assess.      Requested Prescriptions  Pending Prescriptions Disp Refills   diclofenac (VOLTAREN) 75 MG EC tablet [Pharmacy Med Name: DICLOFENAC SODIUM 75MG DR TABLETS] 180 tablet 0    Sig: TAKE 1 TABLET BY MOUTH TWICE DAILY     Analgesics:  NSAIDS Failed - 12/31/2021  1:55 PM      Failed - Manual Review: Labs are only required if the patient has taken medication for more than 8 weeks.      Failed - HGB in normal range and within 360 days    Hemoglobin  Date Value Ref Range Status  05/19/2020 15.0 11.7 - 15.5 g/dL Final  10/27/2015 13.5 11.1 - 15.9 g/dL Final         Failed - PLT in normal range and within 360 days    Platelets  Date Value Ref Range Status  05/19/2020 369 140 - 400 Thousand/uL Final  10/27/2015 417 (H) 150 - 379 x10E3/uL Final         Failed - HCT in normal range and within 360 days    HCT  Date Value Ref Range Status  05/19/2020 46.1 (H) 35.0 - 45.0 % Final   Hematocrit  Date Value Ref Range Status  10/27/2015 39.3 34.0 - 46.6 % Final         Passed - Cr in normal range and within 360 days    Creat  Date Value Ref Range Status  06/01/2021 0.67 0.60 - 1.00 mg/dL Final         Passed - eGFR is 30 or above and within 360 days    GFR, Est African American  Date Value Ref Range Status  05/19/2020 106 > OR = 60 mL/min/1.68m Final   GFR, Est Non African American  Date Value Ref Range Status  05/19/2020 91 > OR = 60 mL/min/1.767mFinal   eGFR  Date Value Ref Range Status  06/01/2021 93 > OR = 60 mL/min/1.7338minal    Comment:    The eGFR is based on the CKD-EPI 2021 equation. To calculate  the new eGFR from a previous Creatinine or Cystatin C result, go to  https://www.kidney.org/professionals/ kdoqi/gfr%5Fcalculator          Passed - Patient is not pregnant      Passed - Valid encounter within last 12 months    Recent Outpatient Visits           7 months ago Controlled type 2 diabetes mellitus without complication, without long-term current use of insulin ([facility]   1 year ago Gastroesophageal reflux disease without esophagitis   [facility]   1 year ago Annual physical exam   [facility]   1 year ago Nail fungus   [facility]   1 year ago Nail fungus   [facility] [place]

## 2022-01-07 ENCOUNTER — Other Ambulatory Visit: Payer: Self-pay | Admitting: Surgery

## 2022-01-11 ENCOUNTER — Ambulatory Visit: Payer: Self-pay | Admitting: *Deleted

## 2022-01-11 ENCOUNTER — Encounter: Payer: Self-pay | Admitting: Internal Medicine

## 2022-01-11 ENCOUNTER — Other Ambulatory Visit: Payer: Self-pay | Admitting: Internal Medicine

## 2022-01-11 DIAGNOSIS — M1990 Unspecified osteoarthritis, unspecified site: Secondary | ICD-10-CM

## 2022-01-11 MED ORDER — DICLOFENAC SODIUM 75 MG PO TBEC: 75.0000 mg | DELAYED_RELEASE_TABLET | Freq: Every day | ORAL | 0 refills | Status: DC

## 2022-01-11 NOTE — Telephone Encounter (Signed)
Summary: medication directions   Pharmacy called in needs clear directions on for patient taking Diclofenac 75 mg, says Take 1 tablet (75 mg total) by mouth daily., then says TAKE 1 TABLET(75 MG) BY MOUTH TWICE DAILY     Pharmacy needs clarification on medication sent today- history has twice daily- sent for provider review. Reason for Disposition  [1] Pharmacy calling with prescription question AND [2] triager unable to answer question  Answer Assessment - Initial Assessment Questions 1. NAME of MEDICATION: "What medicine are you calling about?"     Pharmacy requesting clarification of medication direction 2. QUESTION: "What is your question?" (e.g., double dose of medicine, side effect)     Daily or twice daily 3. PRESCRIBING HCP: "Who prescribed it?" Reason: if prescribed by specialist, call should be referred to that group.     PCP Pharmacy request review of medication directions for Diclofenac sent today  Protocols used: Medication Question Call-A-AH

## 2022-01-12 ENCOUNTER — Encounter: Payer: Self-pay | Admitting: Internal Medicine

## 2022-01-13 MED ORDER — DICLOFENAC SODIUM 75 MG PO TBEC: 75.0000 mg | DELAYED_RELEASE_TABLET | Freq: Two times a day (BID) | ORAL | 0 refills | Status: DC

## 2022-01-13 NOTE — Addendum Note (Signed)
Addended by: Jearld Fenton on: 01/13/2022 07:49 AM   Modules accepted: Orders

## 2022-01-13 NOTE — Telephone Encounter (Signed)
New Rx sent to pharmacy with correct instructions.

## 2022-01-20 ENCOUNTER — Other Ambulatory Visit: Payer: Self-pay | Admitting: Internal Medicine

## 2022-01-20 DIAGNOSIS — I10 Essential (primary) hypertension: Secondary | ICD-10-CM

## 2022-01-20 DIAGNOSIS — J302 Other seasonal allergic rhinitis: Secondary | ICD-10-CM

## 2022-01-20 NOTE — Telephone Encounter (Signed)
Courtesy refill. Patient will need an office visit for further refills. Requested Prescriptions  Pending Prescriptions Disp Refills  . losartan (COZAAR) 25 MG tablet [Pharmacy Med Name: LOSARTAN '25MG'$  TABLETS] 30 tablet 0    Sig: TAKE 1 TABLET(25 MG) BY MOUTH DAILY     Cardiovascular:  Angiotensin Receptor Blockers Failed - 01/20/2022 10:10 AM      Failed - Cr in normal range and within 180 days    Creat  Date Value Ref Range Status  06/01/2021 0.67 0.60 - 1.00 mg/dL Final         Failed - K in normal range and within 180 days    Potassium  Date Value Ref Range Status  06/01/2021 4.7 3.5 - 5.3 mmol/L Final         Failed - Last BP in normal range    BP Readings from Last 1 Encounters:  08/14/21 (!) 154/62         Passed - Patient is not pregnant      Passed - Valid encounter within last 6 months    Recent Outpatient Visits          7 months ago Controlled type 2 diabetes mellitus without complication, without long-term current use of insulin ([place])   [facility] [place], Coralie Keens, NP   1 year ago Gastroesophageal reflux disease without esophagitis   [facility] [place], Coralie Keens, NP   1 year ago Annual physical exam   [facility], Lupita Raider, FNP   1 year ago Nail fungus   [facility], Lupita Raider, FNP   1 year ago Nail fungus   [facility], Nicole M, [place]             . montelukast (SINGULAIR) 10 MG tablet [Pharmacy Med Name: MONTELUKAST '10MG'$  TABLETS] 90 tablet 0    Sig: TAKE 1 TABLET(10 MG) BY MOUTH DAILY WITH SUPPER     Pulmonology:  Leukotriene Inhibitors Passed - 01/20/2022 10:10 AM      Passed - Valid encounter within last 12 months    Recent Outpatient Visits          7 months ago Controlled type 2 diabetes mellitus without complication, without long-term current use of insulin ([place])   [facility] [place], Coralie Keens, NP   1 year ago Gastroesophageal reflux  disease without esophagitis   [facility] [place], Coralie Keens, NP   1 year ago Annual physical exam   [facility], Lupita Raider, FNP   1 year ago Nail fungus   [facility], Lupita Raider, FNP   1 year ago Nail fungus   [facility], Lupita Raider, [place]

## 2022-01-20 NOTE — Telephone Encounter (Signed)
Called pt  - LMOM to return call to schedule OV.

## 2022-01-21 ENCOUNTER — Encounter
Admission: RE | Admit: 2022-01-21 | Discharge: 2022-01-21 | Disposition: A | Discharge status: Home / Self Care | Payer: Worker's Compensation | Source: Ambulatory Visit | Attending: Surgery | Admitting: Surgery

## 2022-01-21 VITALS — BP 126/62 | HR 82 | Resp 16 | Ht 62.0 in | Wt 167.5 lb

## 2022-01-21 DIAGNOSIS — E119 Type 2 diabetes mellitus without complications: Secondary | ICD-10-CM | POA: Diagnosis not present

## 2022-01-21 DIAGNOSIS — Z01810 Encounter for preprocedural cardiovascular examination: Secondary | ICD-10-CM | POA: Diagnosis not present

## 2022-01-21 DIAGNOSIS — Z01818 Encounter for other preprocedural examination: Secondary | ICD-10-CM | POA: Insufficient documentation

## 2022-01-21 HISTORY — DX: Atherosclerosis of aorta: I70.0

## 2022-01-21 HISTORY — DX: Hyperlipidemia, unspecified: E78.5

## 2022-01-21 HISTORY — DX: Sarcoidosis of lung: D86.0

## 2022-01-21 LAB — CBC WITH DIFFERENTIAL/PLATELET
Abs Immature Granulocytes: 0.03 10*3/uL (ref 0.00–0.07)
Basophils Absolute: 0.1 10*3/uL (ref 0.0–0.1)
Basophils Relative: 1 %
Eosinophils Absolute: 0.3 10*3/uL (ref 0.0–0.5)
Eosinophils Relative: 4 %
HCT: 39 % (ref 36.0–46.0)
Hemoglobin: 12.6 g/dL (ref 12.0–15.0)
Immature Granulocytes: 0 %
Lymphocytes Relative: 25 %
Lymphs Abs: 1.8 10*3/uL (ref 0.7–4.0)
MCH: 30.3 pg (ref 26.0–34.0)
MCHC: 32.3 g/dL (ref 30.0–36.0)
MCV: 93.8 fL (ref 80.0–100.0)
Monocytes Absolute: 0.7 10*3/uL (ref 0.1–1.0)
Monocytes Relative: 9 %
Neutro Abs: 4.2 10*3/uL (ref 1.7–7.7)
Neutrophils Relative %: 61 %
Platelets: 344 10*3/uL (ref 150–400)
RBC: 4.16 MIL/uL (ref 3.87–5.11)
RDW: 13.5 % (ref 11.5–15.5)
WBC: 7 10*3/uL (ref 4.0–10.5)
nRBC: 0 % (ref 0.0–0.2)

## 2022-01-21 LAB — HEMOGLOBIN A1C
Hgb A1c MFr Bld: 5.9 % — ABNORMAL HIGH (ref 4.8–5.6)
Mean Plasma Glucose: 122.63 mg/dL

## 2022-01-21 LAB — URINALYSIS, ROUTINE W REFLEX MICROSCOPIC
Bilirubin Urine: NEGATIVE
Glucose, UA: NEGATIVE mg/dL
Hgb urine dipstick: NEGATIVE
Ketones, ur: NEGATIVE mg/dL
Leukocytes,Ua: NEGATIVE
Nitrite: NEGATIVE
Protein, ur: NEGATIVE mg/dL
Specific Gravity, Urine: 1.008 (ref 1.005–1.030)
pH: 6 (ref 5.0–8.0)

## 2022-01-21 LAB — TYPE AND SCREEN
ABO/RH(D): A POS
Antibody Screen: NEGATIVE

## 2022-01-21 LAB — COMPREHENSIVE METABOLIC PANEL
ALT: 23 U/L (ref 0–44)
AST: 26 U/L (ref 15–41)
Albumin: 3.9 g/dL (ref 3.5–5.0)
Alkaline Phosphatase: 50 U/L (ref 38–126)
Anion gap: 8 (ref 5–15)
BUN: 15 mg/dL (ref 8–23)
CO2: 25 mmol/L (ref 22–32)
Calcium: 9.3 mg/dL (ref 8.9–10.3)
Chloride: 107 mmol/L (ref 98–111)
Creatinine, Ser: 0.51 mg/dL (ref 0.44–1.00)
GFR, Estimated: >60 mL/min (ref >60)
Glucose, Bld: 90 mg/dL (ref 70–99)
Potassium: 3.9 mmol/L (ref 3.5–5.1)
Sodium: 140 mmol/L (ref 135–145)
Total Bilirubin: 0.7 mg/dL (ref 0.3–1.2)
Total Protein: 6.1 g/dL — ABNORMAL LOW (ref 6.5–8.1)

## 2022-01-21 LAB — SURGICAL PCR SCREEN
MRSA, PCR: NEGATIVE
Staphylococcus aureus: POSITIVE — AB

## 2022-01-21 NOTE — Patient Instructions (Addendum)
Your procedure is scheduled on:02-01-22 Tuesday Report to the Registration Desk on the 1st floor of the [facility].Then proceed to the 2nd floor Surgery Desk To find out your arrival time, please call [phone] between 1PM - 3PM on:01-31-22 Monday If your arrival time is 6:00 am, do not arrive prior to that time as the [facility] entrance doors do not open until 6:00 am.  REMEMBER: Instructions that are not followed completely may result in serious medical risk, up to and including death; or upon the discretion of your surgeon and anesthesiologist your surgery may need to be rescheduled.  Do not eat food after midnight the night before surgery.  No gum chewing, lozengers or hard candies.  You may however, drink Water up to 2 hours before you are scheduled to arrive for your surgery. Do not drink anything within 2 hours of your scheduled arrival time.  In addition, your doctor has ordered for you to drink the provided  Gatorade G2 Drinking this carbohydrate drink up to two hours before surgery helps to reduce insulin resistance and improve patient outcomes. Please complete drinking 2 hours prior to scheduled arrival time.  TAKE THESE MEDICATIONS THE MORNING OF SURGERY WITH A SIP OF WATER: -famotidine (PEPCID)   Use your albuterol (VENTOLIN HFA) Inhaler and Flovent Inhaler the day of surgery and bring your Albuterol Inhaler to the hospital  Stop your 81 mg Aspirin 7 days prior to surgery 01-24-22 Monday  One week prior to surgery: Stop Anti-inflammatories (NSAIDS) such as Diclofenac (Voltaren), Advil, Aleve, Ibuprofen, Motrin, Naproxen, Naprosyn and Aspirin based products such as Excedrin, Goodys Powder, BC Powder.You may however, take Tylenol if needed for pain up until the day of surgery.  Stop ANY OVER THE COUNTER supplements/vitamins 7 days prior to surgery until after surgery (DoTerra Essential Oils and Immunity Combo)  No Alcohol for 24 hours before or after surgery.  No  Smoking including e-cigarettes for 24 hours prior to surgery.  No chewable tobacco products for at least 6 hours prior to surgery.  No nicotine patches on the day of surgery.  Do not use any "recreational" drugs for at least a week prior to your surgery.  Please be advised that the combination of cocaine and anesthesia may have negative outcomes, up to and including death. If you test positive for cocaine, your surgery will be cancelled.  On the morning of surgery brush your teeth with toothpaste and water, you may rinse your mouth with mouthwash if you wish. Do not swallow any toothpaste or mouthwash.  Use CHG Soap as directed on instruction sheet/  Total Shoulder Arthroplasty:  use Benzolyl Peroxide 5% Gel as directed on instruction sheet.  Do not wear jewelry, make-up, hairpins, clips or nail polish.  Do not wear lotions, powders, or perfumes.   Do not shave body from the neck down 48 hours prior to surgery just in case you cut yourself which could leave a site for infection.  Also, freshly shaved skin may become irritated if using the CHG soap.  Contact lenses, hearing aids and dentures may not be worn into surgery.  Do not bring valuables to the hospital. [facility] is not responsible for any missing/lost belongings or valuables.    Notify your doctor if there is any change in your medical condition (cold, fever, infection).  Wear comfortable clothing (specific to your surgery type) to the hospital.  After surgery, you can help prevent lung complications by doing breathing exercises.  Take deep breaths and cough  every 1-2 hours. Your doctor may order a device called an Incentive Spirometer to help you take deep breaths. When coughing or sneezing, hold a pillow firmly against your incision with both hands. This is called "splinting." Doing this helps protect your incision. It also decreases belly discomfort.  If you are being admitted to the hospital overnight, leave your  suitcase in the car. After surgery it may be brought to your room.  If you are being discharged the day of surgery, you will not be allowed to drive home. You will need a responsible adult (18 years or older) to drive you home and stay with you that night.   If you are taking public transportation, you will need to have a responsible adult (18 years or older) with you. Please confirm with your physician that it is acceptable to use public transportation.   Please call the [place] Dept. at [phone] if you have any questions about these instructions.  Surgery Visitation Policy:  Patients undergoing a surgery or procedure may have two family members or support persons with them as long as the person is not COVID-19 positive or experiencing its symptoms.   Inpatient Visitation:    Visiting hours are 7 a.m. to 8 p.m. Up to four visitors are allowed at one time in a patient room, including children. The visitors may rotate out with other people during the day. One designated support person (adult) may remain overnight.    How to Use an Incentive Spirometer An incentive spirometer is a tool that measures how well you are filling your lungs with each breath. Learning to take long, deep breaths using this tool can help you keep your lungs clear and active. This may help to reverse or lessen your chance of developing breathing (pulmonary) problems, especially infection. You may be asked to use a spirometer: After a surgery. If you have a lung problem or a history of smoking. After a long period of time when you have been unable to move or be active. If the spirometer includes an indicator to show the highest number that you have reached, your health care provider or respiratory therapist will help you set a goal. Keep a log of your progress as told by your health care provider. What are the risks? Breathing too quickly may cause dizziness or cause you to pass out. Take your  time so you do not get dizzy or light-headed. If you are in pain, you may need to take pain medicine before doing incentive spirometry. It is harder to take a deep breath if you are having pain. How to use your incentive spirometer  Sit up on the edge of your bed or on a chair. Hold the incentive spirometer so that it is in an upright position. Before you use the spirometer, breathe out normally. Place the mouthpiece in your mouth. Make sure your lips are closed tightly around it. Breathe in slowly and as deeply as you can through your mouth, causing the piston or the ball to rise toward the top of the chamber. Hold your breath for 3-5 seconds, or for as long as possible. If the spirometer includes a coach indicator, use this to guide you in breathing. Slow down your breathing if the indicator goes above the marked areas. Remove the mouthpiece from your mouth and breathe out normally. The piston or ball will return to the bottom of the chamber. Rest for a few seconds, then repeat the steps 10 or more times. Take  your time and take a few normal breaths between deep breaths so that you do not get dizzy or light-headed. Do this every 1-2 hours when you are awake. If the spirometer includes a goal marker to show the highest number you have reached (best effort), use this as a goal to work toward during each repetition. After each set of 10 deep breaths, cough a few times. This will help to make sure that your lungs are clear. If you have an incision on your chest or abdomen from surgery, place a pillow or a rolled-up towel firmly against the incision when you cough. This can help to reduce pain while taking deep breaths and coughing. General tips When you are able to get out of bed: Walk around often. Continue to take deep breaths and cough in order to clear your lungs. Keep using the incentive spirometer until your health care provider says it is okay to stop using it. If you have been in the  hospital, you may be told to keep using the spirometer at home. Contact a health care provider if: You are having difficulty using the spirometer. You have trouble using the spirometer as often as instructed. Your pain medicine is not giving enough relief for you to use the spirometer as told. You have a fever. Get help right away if: You develop shortness of breath. You develop a cough with bloody mucus from the lungs. You have fluid or blood coming from an incision site after you cough. Summary An incentive spirometer is a tool that can help you learn to take long, deep breaths to keep your lungs clear and active. You may be asked to use a spirometer after a surgery, if you have a lung problem or a history of smoking, or if you have been inactive for a long period of time. Use your incentive spirometer as instructed every 1-2 hours while you are awake. If you have an incision on your chest or abdomen, place a pillow or a rolled-up towel firmly against your incision when you cough. This will help to reduce pain. Get help right away if you have shortness of breath, you cough up bloody mucus, or blood comes from your incision when you cough. This information is not intended to replace advice given to you by your health care provider. Make sure you discuss any questions you have with your health care provider. Document Revised: 09/23/2019 Document Reviewed: 09/23/2019 Elsevier Patient Education  [place].

## 2022-01-21 NOTE — Telephone Encounter (Signed)
Requested medication (s) are due for refill today: no  Requested medication (s) are on the active medication list: yes  Last refill:  01/20/22  Future visit scheduled: yes  Notes to clinic:  Unable to refill per protocol, Patient requests 90 days supply. Routing for approval.     Requested Prescriptions  Pending Prescriptions Disp Refills   losartan (COZAAR) 25 MG tablet [Pharmacy Med Name: LOSARTAN '25MG'$  TABLETS] 90 tablet     Sig: TAKE 1 TABLET(25 MG) BY MOUTH DAILY     Cardiovascular:  Angiotensin Receptor Blockers Failed - 01/20/2022  5:39 PM      Failed - Cr in normal range and within 180 days    Creat  Date Value Ref Range Status  06/01/2021 0.67 0.60 - 1.00 mg/dL Final         Failed - K in normal range and within 180 days    Potassium  Date Value Ref Range Status  06/01/2021 4.7 3.5 - 5.3 mmol/L Final         Failed - Last BP in normal range    BP Readings from Last 1 Encounters:  08/14/21 (!) 154/62         Passed - Patient is not pregnant      Passed - Valid encounter within last 6 months    Recent Outpatient Visits           7 months ago Controlled type 2 diabetes mellitus without complication, without long-term current use of insulin ([place])   [facility] [place], Coralie Keens, NP   1 year ago Gastroesophageal reflux disease without esophagitis   [facility] [place], Coralie Keens, NP   1 year ago Annual physical exam   [facility], Lupita Raider, FNP   1 year ago Nail fungus   [place], FNP   1 year ago Nail fungus   [facility], Lupita Raider, [place]

## 2022-02-01 ENCOUNTER — Ambulatory Visit: Payer: Worker's Compensation

## 2022-02-01 ENCOUNTER — Encounter: Payer: Self-pay | Admitting: Surgery

## 2022-02-01 ENCOUNTER — Ambulatory Visit: Payer: Worker's Compensation | Admitting: Urgent Care

## 2022-02-01 ENCOUNTER — Other Ambulatory Visit: Payer: Self-pay

## 2022-02-01 ENCOUNTER — Encounter
Admission: RE | Disposition: A | Discharge status: Home / Self Care | Payer: Self-pay | Source: Home / Self Care | Attending: Surgery

## 2022-02-01 ENCOUNTER — Ambulatory Visit
Admission: RE | Admit: 2022-02-01 | Discharge: 2022-02-01 | Disposition: A | Discharge status: Home / Self Care | Payer: Worker's Compensation | Attending: Surgery | Admitting: Surgery

## 2022-02-01 ENCOUNTER — Ambulatory Visit: Payer: Medicare PPO

## 2022-02-01 DIAGNOSIS — Z8616 Personal history of COVID-19: Secondary | ICD-10-CM | POA: Diagnosis not present

## 2022-02-01 DIAGNOSIS — K219 Gastro-esophageal reflux disease without esophagitis: Secondary | ICD-10-CM | POA: Diagnosis not present

## 2022-02-01 DIAGNOSIS — Y990 Civilian activity done for income or pay: Secondary | ICD-10-CM | POA: Insufficient documentation

## 2022-02-01 DIAGNOSIS — S46011A Strain of muscle(s) and tendon(s) of the rotator cuff of right shoulder, initial encounter: Secondary | ICD-10-CM | POA: Diagnosis not present

## 2022-02-01 DIAGNOSIS — M7521 Bicipital tendinitis, right shoulder: Secondary | ICD-10-CM | POA: Diagnosis not present

## 2022-02-01 DIAGNOSIS — I700 Atherosclerosis of aorta: Secondary | ICD-10-CM | POA: Diagnosis not present

## 2022-02-01 DIAGNOSIS — Z79899 Other long term (current) drug therapy: Secondary | ICD-10-CM | POA: Insufficient documentation

## 2022-02-01 DIAGNOSIS — J45909 Unspecified asthma, uncomplicated: Secondary | ICD-10-CM | POA: Diagnosis not present

## 2022-02-01 DIAGNOSIS — M19011 Primary osteoarthritis, right shoulder: Secondary | ICD-10-CM | POA: Diagnosis not present

## 2022-02-01 DIAGNOSIS — D860 Sarcoidosis of lung: Secondary | ICD-10-CM | POA: Insufficient documentation

## 2022-02-01 DIAGNOSIS — E119 Type 2 diabetes mellitus without complications: Secondary | ICD-10-CM | POA: Insufficient documentation

## 2022-02-01 DIAGNOSIS — G8918 Other acute postprocedural pain: Secondary | ICD-10-CM | POA: Diagnosis not present

## 2022-02-01 DIAGNOSIS — Z01818 Encounter for other preprocedural examination: Secondary | ICD-10-CM

## 2022-02-01 DIAGNOSIS — I10 Essential (primary) hypertension: Secondary | ICD-10-CM | POA: Diagnosis not present

## 2022-02-01 DIAGNOSIS — W19XXXA Unspecified fall, initial encounter: Secondary | ICD-10-CM | POA: Diagnosis not present

## 2022-02-01 DIAGNOSIS — U071 COVID-19: Secondary | ICD-10-CM

## 2022-02-01 HISTORY — PX: REVERSE SHOULDER ARTHROPLASTY: SHX5054

## 2022-02-01 LAB — GLUCOSE, CAPILLARY
Glucose-Capillary: 112 mg/dL — ABNORMAL HIGH (ref 70–99)
Glucose-Capillary: 180 mg/dL — ABNORMAL HIGH (ref 70–99)

## 2022-02-01 LAB — ABO/RH: ABO/RH(D): A POS

## 2022-02-01 SURGERY — ARTHROPLASTY, SHOULDER, TOTAL, REVERSE
Anesthesia: General | Site: Shoulder | Laterality: Right

## 2022-02-01 MED ORDER — ACETAMINOPHEN 10 MG/ML IV SOLN: 1000.0000 mg | Freq: Once | INTRAVENOUS | Status: DC | PRN

## 2022-02-01 MED ORDER — FENTANYL CITRATE PF 50 MCG/ML IJ SOSY: 50.0000 ug | PREFILLED_SYRINGE | INTRAMUSCULAR | Status: DC | PRN

## 2022-02-01 MED ORDER — VANCOMYCIN HCL IN DEXTROSE 1-5 GM/200ML-% IV SOLN
INTRAVENOUS | Status: DC
Start: 2022-02-01 — End: 2022-02-01
  Filled 2022-02-01: qty 200

## 2022-02-01 MED ORDER — TRANEXAMIC ACID 1000 MG/10ML IV SOLN
INTRAVENOUS | Status: AC
  Filled 2022-02-01: qty 10

## 2022-02-01 MED ORDER — OXYCODONE HCL 5 MG PO TABS
5.0000 mg | ORAL_TABLET | Freq: Once | ORAL | Status: AC | PRN
  Administered 2022-02-01: 5 mg via ORAL

## 2022-02-01 MED ORDER — BUPIVACAINE LIPOSOME 1.3 % IJ SUSP
INTRAMUSCULAR | Status: AC
  Filled 2022-02-01: qty 10

## 2022-02-01 MED ORDER — ONDANSETRON HCL 4 MG/2ML IJ SOLN: 4.0000 mg | Freq: Once | INTRAMUSCULAR | Status: DC | PRN

## 2022-02-01 MED ORDER — SEVOFLURANE IN SOLN
RESPIRATORY_TRACT | Status: AC
  Filled 2022-02-01: qty 250

## 2022-02-01 MED ORDER — BUPIVACAINE LIPOSOME 1.3 % IJ SUSP
INTRAMUSCULAR | Status: DC | PRN
  Administered 2022-02-01: 10 mL

## 2022-02-01 MED ORDER — BUPIVACAINE LIPOSOME 1.3 % IJ SUSP
INTRAMUSCULAR | Status: DC | PRN
  Administered 2022-02-01: 10 mL via PERINEURAL

## 2022-02-01 MED ORDER — ACETAMINOPHEN 10 MG/ML IV SOLN
INTRAVENOUS | Status: AC
  Filled 2022-02-01: qty 100

## 2022-02-01 MED ORDER — SODIUM CHLORIDE 0.9 % IV SOLN: INTRAVENOUS | Status: DC

## 2022-02-01 MED ORDER — PROPOFOL 10 MG/ML IV BOLUS
INTRAVENOUS | Status: DC | PRN
  Administered 2022-02-01: 140 mg via INTRAVENOUS

## 2022-02-01 MED ORDER — GLYCOPYRROLATE 0.2 MG/ML IJ SOLN
INTRAMUSCULAR | Status: AC
  Filled 2022-02-01: qty 1

## 2022-02-01 MED ORDER — DEXAMETHASONE SODIUM PHOSPHATE 10 MG/ML IJ SOLN
INTRAMUSCULAR | Status: AC
  Filled 2022-02-01: qty 1

## 2022-02-01 MED ORDER — FAMOTIDINE 20 MG PO TABS: 20.0000 mg | ORAL_TABLET | Freq: Every day | ORAL | Status: DC

## 2022-02-01 MED ORDER — OXYCODONE HCL 5 MG/5ML PO SOLN: 5.0000 mg | Freq: Once | ORAL | Status: AC | PRN

## 2022-02-01 MED ORDER — CHLORHEXIDINE GLUCONATE 0.12 % MT SOLN
OROMUCOSAL | Status: AC
  Administered 2022-02-01: 15 mL via OROMUCOSAL
  Filled 2022-02-01: qty 15

## 2022-02-01 MED ORDER — FENTANYL CITRATE PF 50 MCG/ML IJ SOSY
PREFILLED_SYRINGE | INTRAMUSCULAR | Status: AC
  Administered 2022-02-01: 50 ug via INTRAVENOUS
  Filled 2022-02-01: qty 1

## 2022-02-01 MED ORDER — FENTANYL CITRATE (PF) 100 MCG/2ML IJ SOLN
INTRAMUSCULAR | Status: AC
  Filled 2022-02-01: qty 2

## 2022-02-01 MED ORDER — BUPIVACAINE HCL (PF) 0.5 % IJ SOLN
INTRAMUSCULAR | Status: AC
  Filled 2022-02-01: qty 10

## 2022-02-01 MED ORDER — SODIUM CHLORIDE FLUSH 0.9 % IV SOLN
INTRAVENOUS | Status: AC
  Filled 2022-02-01: qty 3

## 2022-02-01 MED ORDER — CEFAZOLIN SODIUM-DEXTROSE 2-4 GM/100ML-% IV SOLN
2.0000 g | Freq: Four times a day (QID) | INTRAVENOUS | Status: DC
  Administered 2022-02-01: 2 g via INTRAVENOUS

## 2022-02-01 MED ORDER — LOSARTAN POTASSIUM 25 MG PO TABS: 25.0000 mg | ORAL_TABLET | ORAL | Status: DC

## 2022-02-01 MED ORDER — ACETAMINOPHEN 10 MG/ML IV SOLN
INTRAVENOUS | Status: DC | PRN
  Administered 2022-02-01: 1000 mg via INTRAVENOUS

## 2022-02-01 MED ORDER — PHENYLEPHRINE 80 MCG/ML (10ML) SYRINGE FOR IV PUSH (FOR BLOOD PRESSURE SUPPORT)
PREFILLED_SYRINGE | INTRAVENOUS | Status: AC
  Filled 2022-02-01: qty 10

## 2022-02-01 MED ORDER — CEFAZOLIN SODIUM-DEXTROSE 2-4 GM/100ML-% IV SOLN
2.0000 g | Freq: Once | INTRAVENOUS | Status: AC
  Administered 2022-02-01: 2 g via INTRAVENOUS

## 2022-02-01 MED ORDER — BUPIVACAINE-EPINEPHRINE (PF) 0.5% -1:200000 IJ SOLN
INTRAMUSCULAR | Status: AC
  Filled 2022-02-01: qty 30

## 2022-02-01 MED ORDER — OXYCODONE HCL 5 MG PO TABS: 5.0000 mg | ORAL_TABLET | ORAL | Status: DC | PRN

## 2022-02-01 MED ORDER — LIDOCAINE HCL (PF) 2 % IJ SOLN
INTRAMUSCULAR | Status: AC
  Filled 2022-02-01: qty 5

## 2022-02-01 MED ORDER — CHLORHEXIDINE GLUCONATE 0.12 % MT SOLN: 15.0000 mL | Freq: Once | OROMUCOSAL | Status: AC

## 2022-02-01 MED ORDER — OXYCODONE HCL 5 MG PO TABS: 5.0000 mg | ORAL_TABLET | ORAL | 0 refills | Status: DC | PRN

## 2022-02-01 MED ORDER — SUGAMMADEX SODIUM 200 MG/2ML IV SOLN
INTRAVENOUS | Status: DC | PRN
  Administered 2022-02-01: 152 mg via INTRAVENOUS

## 2022-02-01 MED ORDER — BUPIVACAINE HCL (PF) 0.5 % IJ SOLN
INTRAMUSCULAR | Status: DC | PRN
  Administered 2022-02-01: 10 mL via PERINEURAL

## 2022-02-01 MED ORDER — KETOROLAC TROMETHAMINE 15 MG/ML IJ SOLN: 15.0000 mg | Freq: Once | INTRAMUSCULAR | Status: AC

## 2022-02-01 MED ORDER — FENTANYL CITRATE (PF) 100 MCG/2ML IJ SOLN
INTRAMUSCULAR | Status: DC | PRN
  Administered 2022-02-01 (×3): 50 ug via INTRAVENOUS

## 2022-02-01 MED ORDER — PHENYLEPHRINE HCL (PRESSORS) 10 MG/ML IV SOLN
INTRAVENOUS | Status: DC | PRN
  Administered 2022-02-01 (×2): 40 ug via INTRAVENOUS
  Administered 2022-02-01: 80 ug via INTRAVENOUS

## 2022-02-01 MED ORDER — VANCOMYCIN HCL IN DEXTROSE 1-5 GM/200ML-% IV SOLN: 1000.0000 mg | INTRAVENOUS | Status: DC

## 2022-02-01 MED ORDER — BUPIVACAINE-EPINEPHRINE (PF) 0.5% -1:200000 IJ SOLN
INTRAMUSCULAR | Status: DC | PRN
  Administered 2022-02-01: 30 mL

## 2022-02-01 MED ORDER — CEFAZOLIN SODIUM-DEXTROSE 2-4 GM/100ML-% IV SOLN
INTRAVENOUS | Status: AC
  Filled 2022-02-01: qty 100

## 2022-02-01 MED ORDER — SODIUM CHLORIDE 0.9 % IR SOLN
Status: DC | PRN
  Administered 2022-02-01: 3000 mL

## 2022-02-01 MED ORDER — DEXAMETHASONE SODIUM PHOSPHATE 10 MG/ML IJ SOLN
INTRAMUSCULAR | Status: DC | PRN
  Administered 2022-02-01: 5 mg via INTRAVENOUS

## 2022-02-01 MED ORDER — FENTANYL CITRATE (PF) 100 MCG/2ML IJ SOLN: 25.0000 ug | INTRAMUSCULAR | Status: DC | PRN

## 2022-02-01 MED ORDER — OXYCODONE HCL 5 MG PO TABS
ORAL_TABLET | ORAL | Status: AC
  Filled 2022-02-01: qty 1

## 2022-02-01 MED ORDER — ROCURONIUM BROMIDE 100 MG/10ML IV SOLN
INTRAVENOUS | Status: DC | PRN
  Administered 2022-02-01 (×2): 10 mg via INTRAVENOUS
  Administered 2022-02-01: 40 mg via INTRAVENOUS

## 2022-02-01 MED ORDER — ALBUTEROL SULFATE HFA 108 (90 BASE) MCG/ACT IN AERS: 2.0000 | INHALATION_SPRAY | RESPIRATORY_TRACT | Status: AC

## 2022-02-01 MED ORDER — KETOROLAC TROMETHAMINE 15 MG/ML IJ SOLN
INTRAMUSCULAR | Status: AC
  Administered 2022-02-01: 15 mg via INTRAVENOUS
  Filled 2022-02-01: qty 1

## 2022-02-01 MED ORDER — 0.9 % SODIUM CHLORIDE (POUR BTL) OPTIME
TOPICAL | Status: DC | PRN
  Administered 2022-02-01: 1000 mL

## 2022-02-01 MED ORDER — PHENYLEPHRINE HCL-NACL 20-0.9 MG/250ML-% IV SOLN
INTRAVENOUS | Status: AC
  Filled 2022-02-01: qty 250

## 2022-02-01 MED ORDER — ONDANSETRON HCL 4 MG/2ML IJ SOLN
INTRAMUSCULAR | Status: DC | PRN
  Administered 2022-02-01: 4 mg via INTRAVENOUS

## 2022-02-01 MED ORDER — ORAL CARE MOUTH RINSE: 15.0000 mL | Freq: Once | OROMUCOSAL | Status: AC

## 2022-02-01 MED ORDER — TRANEXAMIC ACID 1000 MG/10ML IV SOLN
INTRAVENOUS | Status: DC | PRN
  Administered 2022-02-01: 1000 mg via TOPICAL

## 2022-02-01 MED ORDER — ONDANSETRON HCL 4 MG/2ML IJ SOLN
INTRAMUSCULAR | Status: AC
  Filled 2022-02-01: qty 2

## 2022-02-01 MED ORDER — ROCURONIUM BROMIDE 10 MG/ML (PF) SYRINGE
PREFILLED_SYRINGE | INTRAVENOUS | Status: AC
  Filled 2022-02-01: qty 10

## 2022-02-01 SURGICAL SUPPLY — 72 items
APL PRP STRL LF DISP 70% ISPRP (MISCELLANEOUS) ×1
BASEPLATE BOSS DRILL (MISCELLANEOUS) ×1 IMPLANT
BIT DRILL 2.5 (BIT) ×2
BIT DRILL 2.5X4.5XSCR (BIT) IMPLANT
BIT DRILL F/BASEPLATE CENTRAL (BIT) IMPLANT
BIT DRL 2.5X4.5XSCR (BIT) ×1
BLADE SAW SAG 25X90X1.19 (BLADE) ×2 IMPLANT
BSPLAT GLND THK2 22.8X27.3 (Plate) ×1 IMPLANT
CHLORAPREP W/TINT 26 (MISCELLANEOUS) ×2 IMPLANT
COOLER POLAR GLACIER W/PUMP (MISCELLANEOUS) ×2 IMPLANT
COVER BACK TABLE REUSABLE LG (DRAPES) ×2 IMPLANT
DRAPE 3/4 80X56 (DRAPES) ×2 IMPLANT
DRAPE INCISE IOBAN 66X45 STRL (DRAPES) ×2 IMPLANT
DRILL BASEPLATE CENTRAL  S (BIT) ×1
DRILL BASEPLATE CENTRAL S (BIT) ×1
DRSG OPSITE POSTOP 4X8 (GAUZE/BANDAGES/DRESSINGS) ×2 IMPLANT
ELECT BLADE 6.5 EXT (BLADE) IMPLANT
ELECT CAUTERY BLADE 6.4 (BLADE) ×2 IMPLANT
ELECT REM PT RETURN 9FT ADLT (ELECTROSURGICAL) ×2
ELECTRODE REM PT RTRN 9FT ADLT (ELECTROSURGICAL) ×1 IMPLANT
GAUZE XEROFORM 1X8 LF (GAUZE/BANDAGES/DRESSINGS) ×2 IMPLANT
GLENOSPHERE RSS 2 CONCENTRIC (Shoulder) ×1 IMPLANT
GLOVE BIO SURGEON STRL SZ7.5 (GLOVE) ×8 IMPLANT
GLOVE BIO SURGEON STRL SZ8 (GLOVE) ×8 IMPLANT
GLOVE BIOGEL PI IND STRL 8 (GLOVE) ×2 IMPLANT
GLOVE BIOGEL PI INDICATOR 8 (GLOVE) ×2
GLOVE SURG UNDER LTX SZ8 (GLOVE) ×2 IMPLANT
GOWN STRL REUS W/ TWL LRG LVL3 (GOWN DISPOSABLE) ×1 IMPLANT
GOWN STRL REUS W/ TWL XL LVL3 (GOWN DISPOSABLE) ×1 IMPLANT
GOWN STRL REUS W/TWL LRG LVL3 (GOWN DISPOSABLE) ×2
GOWN STRL REUS W/TWL XL LVL3 (GOWN DISPOSABLE) ×2
GUIDE PIN 2.0 X 150 (WIRE) ×1 IMPLANT
HOOD PEEL AWAY FLYTE STAYCOOL (MISCELLANEOUS) ×7 IMPLANT
IV NS IRRIG 3000ML ARTHROMATIC (IV SOLUTION) ×2 IMPLANT
KIT STABILIZATION SHOULDER (MISCELLANEOUS) ×2 IMPLANT
KIT TURNOVER KIT A (KITS) ×2 IMPLANT
LINER STD +0S RSS HXL (Liner) ×1 IMPLANT
MANIFOLD NEPTUNE II (INSTRUMENTS) ×2 IMPLANT
MASK FACE SPIDER DISP (MASK) ×2 IMPLANT
MAT ABSORB  FLUID 56X50 GRAY (MISCELLANEOUS) ×1
MAT ABSORB FLUID 56X50 GRAY (MISCELLANEOUS) ×1 IMPLANT
NDL MAYO CATGUT SZ1 (NEEDLE) IMPLANT
NDL SAFETY ECLIPSE 18X1.5 (NEEDLE) ×1 IMPLANT
NDL SPNL 20GX3.5 QUINCKE YW (NEEDLE) ×1 IMPLANT
NEEDLE HYPO 18GX1.5 SHARP (NEEDLE) ×2
NEEDLE MAYO CATGUT SZ1 (NEEDLE) IMPLANT
NEEDLE SPNL 20GX3.5 QUINCKE YW (NEEDLE) ×2 IMPLANT
NS IRRIG 500ML POUR BTL (IV SOLUTION) ×2 IMPLANT
PACK ARTHROSCOPY SHOULDER (MISCELLANEOUS) ×2 IMPLANT
PAD ARMBOARD 7.5X6 YLW CONV (MISCELLANEOUS) ×2 IMPLANT
PAD WRAPON POLAR SHDR UNIV (MISCELLANEOUS) ×1 IMPLANT
PLATE BASE REVERSE RSS S (Plate) ×1 IMPLANT
PULSAVAC PLUS IRRIG FAN TIP (DISPOSABLE) ×2
SCREW 4.5X15 RSS W CAP (Screw) ×2 IMPLANT
SCREW 4.5X20 RSS W CAP (Screw) ×1 IMPLANT
SCREW 4.5X30 RSS W CAP (Screw) ×2 IMPLANT
SCREW BODY REVERSE STD (Screw) ×1 IMPLANT
SLING ULTRA II M (MISCELLANEOUS) ×1 IMPLANT
SPONGE T-LAP 18X18 ~~LOC~~+RFID (SPONGE) ×4 IMPLANT
STAPLER SKIN PROX 35W (STAPLE) ×2 IMPLANT
STEM PRESS FIT SZ 10 TSS (Stem) ×1 IMPLANT
SUT ETHIBOND 0 MO6 C/R (SUTURE) ×2 IMPLANT
SUT FIBERWIRE #2 38 BLUE 1/2 (SUTURE) ×8
SUT VIC AB 0 CT1 36 (SUTURE) ×2 IMPLANT
SUT VIC AB 2-0 CT1 27 (SUTURE) ×4
SUT VIC AB 2-0 CT1 TAPERPNT 27 (SUTURE) ×2 IMPLANT
SUTURE FIBERWR #2 38 BLUE 1/2 (SUTURE) ×4 IMPLANT
SYR 10ML LL (SYRINGE) ×2 IMPLANT
SYR 30ML LL (SYRINGE) ×2 IMPLANT
TIP FAN IRRIG PULSAVAC PLUS (DISPOSABLE) ×1 IMPLANT
WATER STERILE IRR 500ML POUR (IV SOLUTION) ×2 IMPLANT
WRAPON POLAR PAD SHDR UNIV (MISCELLANEOUS) ×2
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
## 2022-02-01 NOTE — Progress Notes (Signed)
Patient awake/alert x4.  Patient only c/o is a sore throat. Tolerating po fluids, lunch ordered. OT to see patient prior to discharge.  Patient to have ABX around 1700 and then be discharge. Patient verbalizes understanding of procedure: interscaline plexus block and plan of care: follow up.

## 2022-02-01 NOTE — Anesthesia Procedure Notes (Signed)
Procedure Name: Intubation Date/Time: 02/01/2022 11:06 AM  Performed by: Demetrius Charity, CRNAPre-anesthesia Checklist: Patient identified, Patient being monitored, Timeout performed, Emergency Drugs available and Suction available Patient Re-evaluated:Patient Re-evaluated prior to induction Oxygen Delivery Method: Circle system utilized Preoxygenation: Pre-oxygenation with 100% oxygen Induction Type: IV induction Ventilation: Mask ventilation without difficulty Laryngoscope Size: McGraph and 4 Grade View: Grade III Tube type: Oral Tube size: 6.5 mm Number of attempts: 1 Airway Equipment and Method: Stylet and Video-laryngoscopy Placement Confirmation: ETT inserted through vocal cords under direct vision, positive ETCO2 and breath sounds checked- equal and bilateral Secured at: 20 cm Tube secured with: Tape Dental Injury: Teeth and Oropharynx as per pre-operative assessment

## 2022-02-01 NOTE — Anesthesia Postprocedure Evaluation (Signed)
Anesthesia Post Note  Patient: Mary Burke  Procedure(s) Performed: REVERSE SHOULDER ARTHROPLASTY (Right: Shoulder)  Patient location during evaluation: PACU Anesthesia Type: General Level of consciousness: awake and alert Pain management: pain level controlled Vital Signs Assessment: post-procedure vital signs reviewed and stable Respiratory status: spontaneous breathing, nonlabored ventilation, respiratory function stable and patient connected to nasal cannula oxygen Cardiovascular status: blood pressure returned to baseline and stable Postop Assessment: no apparent nausea or vomiting Anesthetic complications: no   No notable events documented.   Last Vitals:  Vitals:   02/01/22 1415 02/01/22 1437  BP: 107/84 130/67  Pulse: 76 80  Resp: 16 16  Temp: (!) 36.3 C (!) 36.1 C  SpO2: 100% 98%    Last Pain:  Vitals:   02/01/22 1437  TempSrc: Temporal  PainSc: 0-No pain                 Arita Miss

## 2022-02-01 NOTE — Anesthesia Procedure Notes (Signed)
Anesthesia Regional Block: Interscalene brachial plexus block  ? ?Pre-Anesthetic Checklist: , timeout performed,  Correct Patient, Correct Site, Correct Laterality,  Correct Procedure, Correct Position, site marked,  Risks and benefits discussed,  Surgical consent,  Pre-op evaluation,  At surgeon's request and post-op pain management ? ?Laterality: Right ? ?Prep: chloraprep     ?  ?Needles:  ?Injection technique: Single-shot ? ?Needle Type: Echogenic Needle   ? ? ?Needle Length: 4cm  ?Needle Gauge: 25  ? ? ? ?Additional Needles: ? ? ?Procedures:,,,, ultrasound used (permanent image in chart),,    ?Narrative:  ?Injection made incrementally with aspirations every 5 mL. ? ?Performed by: Personally  ?Anesthesiologist: Marquelle Balow, MD ? ?Additional Notes: ?Patient's chart reviewed and they were deemed appropriate candidate for procedure, at surgeon's request. Patient educated about risks, benefits, and alternatives of the block including but not limited to: temporary or permanent nerve damage, bleeding, infection, damage to surround tissues, pneumothorax, hemidiaphragmatic paralysis, unilateral Horner's syndrome, block failure, local anesthetic toxicity. Patient expressed understanding. A formal time-out was conducted consistent with institution rules. ? ?Monitors were applied, and minimal sedation used (see nursing record). The site was prepped with skin prep and allowed to dry, and sterile gloves were used. A high frequency linear ultrasound probe with probe cover was utilized throughout. C5-7 nerve roots located and appeared anatomically normal, local anesthetic injected around them, and echogenic block needle trajectory was monitored throughout. Aspiration performed every 5ml. Lung and blood vessels were avoided. All injections were performed without resistance and free of blood and paresthesias. The patient tolerated the procedure well. ? ?Injectate: 10ml exparel + 10ml 0.5% bupivacaine  ? ? ? ?

## 2022-02-01 NOTE — Progress Notes (Signed)
Dr. Bertell Maria gave me a verbal order to change the antibiotic to discontinue to vancomycin and order 2g Ancef.  I acknowledged and placed the order.

## 2022-02-01 NOTE — Evaluation (Signed)
Occupational Therapy Evaluation Patient Details Name: Mary Burke MRN: [id number] DOB: 12-15-1945 Today's Date: 02/01/2022   History of Present Illness 76 yo F s/p   Reverse right total shoulder arthroplasty with biceps tenodesis following rotator cuff repair.   Clinical Impression   Mary Burke was seen for OT evaluation this date. Prior to hospital admission, pt was Independent for mobility and ADLs. Pt lives alone with plan for daughter to stay at d/c. Pt presents to acute OT demonstrating impaired ADL performance and functional mobility 2/2 functional impaired use of dominant RUE. Pt currently requires MAX A don/doff sling and polar care, MOD A don/doff shirt and pants. SUPERVISION for ADL t/f and CGA + HHA for stairs - daughter return demonstrated. Pt instructed in polar care mgt, compression stockings mgt, sling/immobilizer mgt, ROM exercises for RUE (with instructions for no shoulder exercises until full sensation has returned), RUE precautions, adaptive strategies for bathing/dressing, and positioning for sleep. Handout provided. Will sign off. Upon hospital discharge, recommend following surgeons recommendations.    Recommendations for follow up therapy are one component of a multi-disciplinary discharge planning process, led by the attending physician.  Recommendations may be updated based on patient status, additional functional criteria and insurance authorization.   Follow Up Recommendations  Follow physician's recommendations for discharge plan and follow up therapies    Assistance Recommended at Discharge Intermittent Supervision/Assistance  Patient can return home with the following A lot of help with walking and/or transfers;A lot of help with bathing/dressing/bathroom    Functional Status Assessment  Patient has had a recent decline in their functional status and demonstrates the ability to make significant improvements in function in a reasonable and predictable  amount of time.  Equipment Recommendations  None recommended by OT    Recommendations for Other Services       Precautions / Restrictions Precautions Precautions: Fall Restrictions Weight Bearing Restrictions: Yes RUE Weight Bearing: Non weight bearing      Mobility Bed Mobility               General bed mobility comments: NT    Transfers Overall transfer level: Independent                        Balance Overall balance assessment: Mild deficits observed, not formally tested                                         ADL either performed or assessed with clinical judgement   ADL Overall ADL's : Needs assistance/impaired                                       General ADL Comments: MAX A don/doff sling and polar care, MOD A don/doff shirt and pants. SUPERVISION for ADL t/f and stairs      Pertinent Vitals/Pain Pain Assessment Pain Assessment: No/denies pain     Hand Dominance Right   Extremity/Trunk Assessment Upper Extremity Assessment Upper Extremity Assessment: RUE deficits/detail RUE: Unable to fully assess due to immobilization   Lower Extremity Assessment Lower Extremity Assessment: Overall WFL for tasks assessed       Communication Communication Communication: No difficulties   Cognition Arousal/Alertness: Awake/alert Behavior During Therapy: WFL for tasks assessed/performed Overall Cognitive Status: Within Functional Limits for tasks assessed  Home Living Family/patient expects to be discharged to:: Private residence Living Arrangements: Children Available Help at Discharge: Family;Available 24 hours/day Type of Home: House Home Access: Stairs to enter [employer] of Steps: 3 Entrance Stairs-Rails: None                            Prior Functioning/Environment Prior Level of Function :  Independent/Modified Independent;Driving                        OT Problem List: Decreased strength;Decreased range of motion;Decreased activity tolerance;Impaired balance (sitting and/or standing);Impaired UE functional use         OT Goals(Current goals can be found in the care plan section) Acute Rehab OT Goals Patient Stated Goal: to go home OT Goal Formulation: With patient/family Time For Goal Achievement: 02/15/22 Potential to Achieve Goals: Good   AM-PAC OT "6 Clicks" Daily Activity     Outcome Measure Help from another person eating meals?: None Help from another person taking care of personal grooming?: A Little Help from another person toileting, which includes using toliet, bedpan, or urinal?: A Lot Help from another person bathing (including washing, rinsing, drying)?: A Little Help from another person to put on and taking off regular upper body clothing?: A Lot Help from another person to put on and taking off regular lower body clothing?: A Lot 6 Click Score: 16   End of Session    Activity Tolerance: Patient tolerated treatment well Patient left: in chair;with call bell/phone within reach;with family/visitor present;with nursing/sitter in room  OT Visit Diagnosis: Muscle weakness (generalized) (M62.81)                Time: 6378-5885 OT Time Calculation (min): 39 min Charges:  OT General Charges $OT Visit: 1 Visit OT Evaluation $OT Eval Moderate Complexity: 1 Mod OT Treatments $Self Care/Home Management : 23-37 mins  Dessie Coma, M.S. OTR/L  02/01/22, 4:46 PM  ascom [phone]
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
## 2022-02-01 NOTE — Discharge Instructions (Addendum)
Orthopedic discharge instructions: May shower with intact OpSite dressing once block has worn off (Saturday).  Apply ice frequently to shoulder or use Polar Care device. Take ibuprofen 600-800 mg TID with meals for 7-10 days, then as necessary. Take oxycodone as prescribed when needed.  May supplement with ES Tylenol if necessary. Keep shoulder immobilizer on at all times except may remove for bathing purposes and for exercises. Follow-up in 10-14 days or as scheduled.  AMBULATORY SURGERY  DISCHARGE INSTRUCTIONS   The drugs that you were given will stay in your system until tomorrow so for the next 24 hours you should not:  Drive an automobile Make any legal decisions Drink any alcoholic beverage   You may resume regular meals tomorrow.  Today it is better to start with liquids and gradually work up to solid foods.  You may eat anything you prefer, but it is better to start with liquids, then soup and crackers, and gradually work up to solid foods.   Please notify your doctor immediately if you have any unusual bleeding, trouble breathing, redness and pain at the surgery site, drainage, fever, or pain not relieved by medication.     Additional Instructions:     Interscalene Nerve Block with Exparel   For your surgery you have received an Interscalene Nerve Block with Exparel. Nerve Blocks affect many types of nerves, including nerves that control movement, pain and normal sensation.  You may experience feelings such as numbness, tingling, heaviness, weakness or the inability to move your arm or the feeling or sensation that your arm has "fallen asleep". A nerve block with Exparel can last up to 5 days.  Usually the weakness wears off first.  The tingling and heaviness usually wear off next.  Finally you may start to notice pain.  Keep in mind that this may occur in any order.  Once a nerve block starts to wear off it is usually completely gone within 60 minutes. ISNB may cause mild  shortness of breath, a hoarse voice, blurry vision, unequal pupils, or drooping of the face on the same side as the nerve block.  These symptoms will usually resolve with the numbness.  Very rarely the procedure itself can cause mild seizures. If needed, your surgeon will give you a prescription for pain medication.  It will take about 60 minutes for the oral pain medication to become fully effective.  So, it is recommended that you start taking this medication before the nerve block first begins to wear off, or when you first begin to feel discomfort. Take your pain medication only as prescribed.  Pain medication can cause sedation and decrease your breathing if you take more than you need for the level of pain that you have. Nausea is a common side effect of many pain medications.  You may want to eat something before taking your pain medicine to prevent nausea. After an Interscalene nerve block, you cannot feel pain, pressure or extremes in temperature in the effected arm.  Because your arm is numb it is at an increased risk for injury.  To decrease the possibility of injury, please practice the following:  While you are awake change the position of your arm frequently to prevent too much pressure on any one area for prolonged periods of time.  If you have a cast or tight dressing, check the color or your fingers every couple of hours.  Call your surgeon with the appearance of any discoloration (white or blue). If you are given  a sling to wear before you go home, please wear it  at all times until the block has completely worn off.  Do not get up at night without your sling. Please contact [facility] or your surgeon if you do not begin to regain sensation after 7 days from the surgery.  Anesthesia may be contacted by calling the Same Day Surgery Department, Mon. through Fri., 6 am to 4 pm at [phone].   If you experience any other problems or concerns, please contact your surgeon's office. If  you experience severe or prolonged shortness of breath go to the nearest emergency department.   POLAR CARE INFORMATION  [email or web address].[email or web address]  How to use [place] Cold Therapy System?  YouTube   BargainHeads.[email or web address]  OPERATING INSTRUCTIONS  Start the product With dry hands, connect the transformer to the electrical connection located on the top of the cooler. Next, plug the transformer into an appropriate electrical outlet. The unit will automatically start running at this point.  To stop the pump, disconnect electrical power.  Unplug to stop the product when not in use. Unplugging the Polar Care unit turns it off. Always unplug immediately after use. Never leave it plugged in while unattended. Remove pad.    FIRST ADD WATER TO FILL LINE, THEN ICE---Replace ice when existing ice is almost melted  1 Discuss Treatment with your [facility] Practitioner and Use Only as Prescribed 2 Apply Insulation Barrier & Cold Therapy Pad 3 Check for Moisture 4 Inspect Skin Regularly  Tips and Trouble Shooting Usage Tips 1. Use cubed or chunked ice for optimal performance. 2. It is recommended to drain the Pad between uses. To drain the pad, hold the Pad upright with the hose pointed toward the ground. Depress the black plunger and allow water to drain out. 3. You may disconnect the Pad from the unit without removing the pad from the affected area by depressing the silver tabs on the hose coupling and gently pulling the hoses apart. The Pad and unit will seal itself and will not leak. Note: Some dripping during release is normal. 4. DO NOT RUN PUMP WITHOUT WATER! The pump in this unit is designed to run with water. Running the unit without water will cause permanent damage to the pump. 5. Unplug unit before removing lid.  TROUBLESHOOTING GUIDE Pump not running, Water not flowing to the pad, Pad is not getting cold 1. Make sure the transformer is plugged into the  wall outlet. 2. Confirm that the ice and water are filled to the indicated levels. 3. Make sure there are no kinks in the pad. 4. Gently pull on the blue tube to make sure the tube/pad junction is straight. 5. Remove the pad from the treatment site and ll it while the pad is lying at; then reapply. 6. Confirm that the pad couplings are securely attached to the unit. Listen for the double clicks (Figure 1) to confirm the pad couplings are securely attached.  Leaks    Note: Some condensation on the lines, controller, and pads is unavoidable, especially in warmer climates. 1. If using a [place] Polar Care Cold Therapy unit with a detachable Cold Therapy Pad, and a leak exists (other than condensation on the lines) disconnect the pad couplings. Make sure the silver tabs on the couplings are depressed before reconnecting the pad to the pump hose; then confirm both sides of the coupling are properly clicked in. 2. If the coupling continues to leak or a leak is  detected in the pad itself, stop using it and call [place] at [phone].  Cleaning After use, empty and dry the unit with a soft cloth. Warm water and mild detergent may be used occasionally to clean the pump and tubes.  WARNING: The [place] can be cold enough to cause serious injury, including full skin necrosis. Follow these Operating Instructions, and carefully read the Product Insert (see pouch on side of unit) and the Cold Therapy Pad Fitting Instructions (provided with each Cold Therapy Pad) prior to use.        SHOULDER SLING IMMOBILIZER   VIDEO Slingshot 2 Shoulder Brace Application - YouTube ---[email or web address]-schwartz.[email or web address]  INSTRUCTIONS While supporting the injured arm, slide the forearm into the sling. Wrap the adjustable shoulder strap around the neck and shoulders and attach the strap end to the sling using  the "alligator strap tab."  Adjust the shoulder strap to the required  length. Position the shoulder pad behind the neck. To secure the shoulder pad location (optional), pull the shoulder strap away from the shoulder pad, unfold the hook material on the top of the pad, then press the shoulder strap back onto the hook material to secure the pad in place. Attach the closure strap across the open top of the sling. Position the strap so that it holds the arm securely in the sling. Next, attach the thumb strap to the open end of the sling between the thumb and fingers. After sling has been fit, it may be easily removed and reapplied using the quick release buckle on shoulder strap. If a neutral pillow or 15 abduction pillow is included, place the pillow at the waistline. Attach the sling to the pillow, lining up hook material on the pillow with the loop on sling. Adjust the waist strap to fit.  If waist strap is too long, cut it to fit. Use the small piece of double sided hook material (located on top of the pillow) to secure the strap end. Place the double sided hook material on the inside of the cut strap end and secure it to the waist strap.     If no pillow is included, attach the waist strap to the sling and adjust to fit.    Washing Instructions: Straps and sling must be removed and cleaned regularly depending on your activity level and perspiration. Hand wash straps and sling in cold water with mild detergent, rinse, air dry

## 2022-02-01 NOTE — Transfer of Care (Signed)
Immediate Anesthesia Transfer of Care Note  Patient: Mary Burke  Procedure(s) Performed: REVERSE SHOULDER ARTHROPLASTY (Right: Shoulder)  Patient Location: PACU  Anesthesia Type:General  Level of Consciousness: awake, alert  and oriented  Airway & Oxygen Therapy: Patient Spontanous Breathing and Patient connected to face mask oxygen  Post-op Assessment: Report given to RN and Post -op Vital signs reviewed and stable  Post vital signs: Reviewed and stable  Last Vitals:  Vitals Value Taken Time  BP 143/64 02/01/22 1331  Temp    Pulse 92 02/01/22 1338  Resp 14 02/01/22 1338  SpO2 96 % 02/01/22 1338  Vitals shown include unvalidated device data.  Last Pain:  Vitals:   02/01/22 0830  TempSrc: Temporal  PainSc: 3          Complications: No notable events documented.

## 2022-02-01 NOTE — Op Note (Signed)
02/01/2022  1:16 PM  Patient:   Mary Burke  Pre-Op Diagnosis:   Massive irreparable rotator cuff tear with early cuff arthropathy, right shoulder.  Post-Op Diagnosis:   Same with biceps tendinopathy, right shoulder.  Procedure:   Reverse right total shoulder arthroplasty with biceps tenodesis.  Surgeon:   Pascal Lux, MD  Assistant:   Cameron Proud, PA-C; Almon Register, PA-S  Anesthesia:   General endotracheal with an interscalene block using Exparel placed preoperatively by the anesthesiologist.  Findings:   As above.  Complications:   None  EBL:   75 cc  Fluids:   750 cc crystalloid  UOP:   None  TT:   None  Drains:   None  Closure:   Staples  Implants:   All press-fit Integra system with a 10 mm stem, a standard metaphyseal body, a +0 mm humeral platform, a mini baseplate, and a 38 mm concentric to mm laterally offset glenosphere.  Brief Clinical Note:   The patient is a 76 year old female with a history of right shoulder pain and weakness resulting from a fall at work on 10/14/2021. The symptoms have persisted despite medications, activity modification, etc. Her history and examination are consistent with a massive irreparable rotator cuff tear with early cuff arthropathy, all of which were confirmed by preoperative MRI scanning. The patient presents at this time for a reverse right total shoulder arthroplasty.  Procedure:   The patient underwent placement of an interscalene block using Exparel by the anesthesiologist in the preoperative holding area before being brought into the operating room and lain in the supine position. The patient then underwent general endotracheal intubation and anesthesia before the patient was repositioned in the beach chair position using the beach chair positioner. The right shoulder and upper extremity were prepped with ChloraPrep solution before being draped sterilely. Preoperative antibiotics were administered.   A timeout was  performed to verify the appropriate surgical site before a standard anterior approach to the shoulder was made through an approximately 4-5 inch incision. The incision was carried down through the subcutaneous tissues to expose the deltopectoral fascia. The interval between the deltoid and pectoralis muscles was identified and this plane developed, retracting the cephalic vein laterally with the deltoid muscle. The conjoined tendon was identified. Its lateral margin was dissected and the Kolbel self-retraining retractor inserted. The "three sisters" were identified and cauterized. Bursal tissues were removed to improve visualization.   The biceps tendon was identified near the inferior aspect of the bicipital groove. A soft tissue tenodesis was performed by attaching the biceps tendon to the adjacent pectoralis major tendon using two #0 Ethibond interrupted sutures. The biceps tendon was then transected just proximal to the tenodesis site. The remnants of the subscapularis tendon were released from its attachment to the lesser tuberosity 1 cm proximal to its insertion and several tagging sutures placed. The inferior capsule was released with care after identifying and protecting the axillary nerve. The proximal humeral cut was made at approximately 20 of retroversion using the extra-medullary guide.   Attention was redirected to the glenoid. The labrum was debrided circumferentially before the center of the glenoid was identified. The guidewire was drilled into the glenoid neck using the appropriate guide. After verifying its position, it was overreamed with the mini-baseplate reamer to create a flat surface before the stem reamer was utilized. The superior and inferior peg sites were reamed using the appropriate guide to complete the glenoid preparation. The permanent mini-baseplate was impacted into place. It  was stabilized with a 30 x 4.5 mm central screw and four peripheral screws. Locking caps were placed  over the superior and inferior screws. The permanent 38 mm concentric glenosphere with 2 mm of lateral offset was then impacted into place and its Morse taper locking mechanism verified using manual distraction.  Attention was directed to the humeral side. The humeral canal was prepared utilizing the tapered stem reamers sequentially beginning with the 7 mm stem and progressing to a 10 mm stem. This demonstrated a good tight fit. The metaphyseal region was then prepared using the appropriate planar device. The trial stem and standard metaphyseal body were put together on the back table and a trial reduction performed using the +0 mm and +3 mm inserts. With the +0 mm insert, the arm demonstrated excellent range of motion as the hand could be brought across the chest to the opposite shoulder and brought to the top of the patient's head and to the patient's ear. The shoulder appeared stable throughout this range of motion. The joint was dislocated and the trial components removed. The permanent 10 mm stem with the standard body was impacted into place with care taken to maintain the appropriate version. A repeat trial reduction with the +0 mm insert again demonstrated excellent stability with the findings as described above. Therefore, the shoulder was re-dislocated and, after inserting the locking screw to secure the body to the stem, the permanent +0 mm insert impacted into place. After verifying its locking mechanism, the shoulder was relocated using two finger pressure and again placed through a range of motion with the findings as described above.  The wound was copiously irrigated with sterile saline solution using the jet lavage system before a total of 10 cc of Exparel diluted out to 60 cc with normal saline and 30 cc of 0.5% Sensorcaine with epinephrine was injected into the pericapsular and peri-incisional tissues to help with postoperative analgesia. The subscapularis tendon was reapproximated using #2  FiberWire interrupted sutures. The deltopectoral interval was closed using #0 Vicryl interrupted sutures before the subcutaneous tissues were closed using 2-0 Vicryl interrupted sutures. The skin was closed using staples. Prior to closing the skin, 1 g of transexemic acid in 10 cc of normal saline was injected intra-articularly to help with postoperative bleeding. A sterile occlusive dressing was applied to the wound before the arm was placed into a shoulder immobilizer with an abduction pillow. A Polar Care system also was applied to the shoulder. The patient was then transferred back to a hospital bed before being awakened, extubated, and returned to the recovery room in satisfactory condition after tolerating the procedure well.

## 2022-02-02 ENCOUNTER — Encounter: Payer: Self-pay | Admitting: Surgery

## 2022-02-03 LAB — SURGICAL PATHOLOGY

## 2022-02-04 DIAGNOSIS — M12811 Other specific arthropathies, not elsewhere classified, right shoulder: Secondary | ICD-10-CM | POA: Insufficient documentation

## 2022-02-04 DIAGNOSIS — Z96611 Presence of right artificial shoulder joint: Secondary | ICD-10-CM | POA: Insufficient documentation

## 2022-02-23 DIAGNOSIS — J4520 Mild intermittent asthma, uncomplicated: Secondary | ICD-10-CM | POA: Diagnosis not present

## 2022-02-23 DIAGNOSIS — D869 Sarcoidosis, unspecified: Secondary | ICD-10-CM | POA: Diagnosis not present

## 2022-02-27 ENCOUNTER — Other Ambulatory Visit: Payer: Self-pay | Admitting: Internal Medicine

## 2022-02-27 DIAGNOSIS — I10 Essential (primary) hypertension: Secondary | ICD-10-CM

## 2022-02-28 NOTE — Telephone Encounter (Signed)
Requested medication (s) are due for refill today: yes  Requested medication (s) are on the active medication list: yes  Last refill:  02/01/22  Future visit scheduled:yes  Notes to clinic:  Unable to refill per protocol, courtesy refill already given, routing for provider approval. Pt has OV scheduled for 03/08/22.     Requested Prescriptions  Pending Prescriptions Disp Refills   losartan (COZAAR) 25 MG tablet [Pharmacy Med Name: LOSARTAN '25MG'$  TABLETS] 90 tablet     Sig: TAKE 1 TABLET(25 MG) BY MOUTH DAILY     Cardiovascular:  Angiotensin Receptor Blockers Passed - 02/27/2022  3:02 PM      Passed - Cr in normal range and within 180 days    Creat  Date Value Ref Range Status  06/01/2021 0.67 0.60 - 1.00 mg/dL Final   Creatinine, Ser  Date Value Ref Range Status  01/21/2022 0.51 0.44 - 1.00 mg/dL Final         Passed - K in normal range and within 180 days    Potassium  Date Value Ref Range Status  01/21/2022 3.9 3.5 - 5.1 mmol/L Final         Passed - Patient is not pregnant      Passed - Last BP in normal range    BP Readings from Last 1 Encounters:  02/01/22 131/74         Passed - Valid encounter within last 6 months    Recent Outpatient Visits           9 months ago Controlled type 2 diabetes mellitus without complication, without long-term current use of insulin ([place])   [facility] [place], Coralie Keens, NP   1 year ago Gastroesophageal reflux disease without esophagitis   [facility] [place], Coralie Keens, NP   1 year ago Annual physical exam   [facility] [place], Lupita Raider, FNP   1 year ago Nail fungus   [facility], Lupita Raider, FNP   2 years ago Nail fungus   [facility], Lupita Raider, FNP       Future Appointments             In 1 week Baity, Coralie Keens, NP [facility], [facility]

## 2022-03-08 ENCOUNTER — Encounter: Payer: Self-pay | Admitting: Internal Medicine

## 2022-03-08 ENCOUNTER — Ambulatory Visit: Payer: Medicare PPO | Admitting: Internal Medicine

## 2022-03-08 VITALS — BP 138/64 | HR 96 | Temp 98.3°F | Wt 167.0 lb

## 2022-03-08 DIAGNOSIS — E6609 Other obesity due to excess calories: Secondary | ICD-10-CM

## 2022-03-08 DIAGNOSIS — K219 Gastro-esophageal reflux disease without esophagitis: Secondary | ICD-10-CM | POA: Diagnosis not present

## 2022-03-08 DIAGNOSIS — Z6830 Body mass index (BMI) 30.0-30.9, adult: Secondary | ICD-10-CM

## 2022-03-08 DIAGNOSIS — Z0001 Encounter for general adult medical examination with abnormal findings: Secondary | ICD-10-CM

## 2022-03-08 MED ORDER — FAMOTIDINE 20 MG PO TABS: 20.0000 mg | ORAL_TABLET | Freq: Every day | ORAL | 1 refills | Status: DC

## 2022-03-08 NOTE — Patient Instructions (Signed)
82 health Maintenance for Postmenopausal Women Menopause is a normal process in which your ability to get pregnant comes to an end. This process happens slowly over many months or years, usually between the ages of 55 and 50. Menopause is complete when you have missed your menstrual period for 12 months. It is important to talk with your health care provider about some of the most common conditions that affect women after menopause (postmenopausal women). These include heart disease, cancer, and bone loss (osteoporosis). Adopting a healthy lifestyle and getting preventive care can help to promote your health and wellness. The actions you take can also lower your chances of developing some of these common conditions. What are the signs and symptoms of menopause? During menopause, you may have the following symptoms: Hot flashes. These can be moderate or severe. Night sweats. Decrease in sex drive. Mood swings. Headaches. Tiredness (fatigue). Irritability. Memory problems. Problems falling asleep or staying asleep. Talk with your health care provider about treatment options for your symptoms. Do I need hormone replacement therapy? Hormone replacement therapy is effective in treating symptoms that are caused by menopause, such as hot flashes and night sweats. Hormone replacement carries certain risks, especially as you become older. If you are thinking about using estrogen or estrogen with progestin, discuss the benefits and risks with your health care provider. How can I reduce my risk for heart disease and stroke? The risk of heart disease, heart attack, and stroke increases as you age. One of the causes may be a change in the body's hormones during menopause. This can affect how your body uses dietary fats, triglycerides, and cholesterol. Heart attack and stroke are medical emergencies. There are many things that you can do to help prevent heart disease and stroke. Watch your blood pressure High  blood pressure causes heart disease and increases the risk of stroke. This is more likely to develop in people who have high blood pressure readings or are overweight. Have your blood pressure checked: Every 3-5 years if you are 16-22 years of age. Every year if you are 39 years old or older. Eat a healthy diet  Eat a diet that includes plenty of vegetables, fruits, low-fat dairy products, and lean protein. Do not eat a lot of foods that are high in solid fats, added sugars, or sodium. Get regular exercise Get regular exercise. This is one of the most important things you can do for your health. Most adults should: Try to exercise for at least 150 minutes each week. The exercise should increase your heart rate and make you sweat (moderate-intensity exercise). Try to do strengthening exercises at least twice each week. Do these in addition to the moderate-intensity exercise. Spend less time sitting. Even light physical activity can be beneficial. Other tips Work with your health care provider to achieve or maintain a healthy weight. Do not use any products that contain nicotine or tobacco. These products include cigarettes, chewing tobacco, and vaping devices, such as e-cigarettes. If you need help quitting, ask your health care provider. Know your numbers. Ask your health care provider to check your cholesterol and your blood sugar (glucose). Continue to have your blood tested as directed by your health care provider. Do I need screening for cancer? Depending on your health history and family history, you may need to have cancer screenings at different stages of your life. This may include screening for: Breast cancer. Cervical cancer. Lung cancer. Colorectal cancer. What is my risk for osteoporosis? After menopause, you may  be at increased risk for osteoporosis. Osteoporosis is a condition in which bone destruction happens more quickly than new bone creation. To help prevent osteoporosis or  the bone fractures that can happen because of osteoporosis, you may take the following actions: If you are 10-69 years old, get at least 1,000 mg of calcium and at least 600 international units (IU) of vitamin D per day. If you are older than age 37 but younger than age 53, get at least 1,200 mg of calcium and at least 600 international units (IU) of vitamin D per day. If you are older than age 30, get at least 1,200 mg of calcium and at least 800 international units (IU) of vitamin D per day. Smoking and drinking excessive alcohol increase the risk of osteoporosis. Eat foods that are rich in calcium and vitamin D, and do weight-bearing exercises several times each week as directed by your health care provider. How does menopause affect my mental health? Depression may occur at any age, but it is more common as you become older. Common symptoms of depression include: Feeling depressed. Changes in sleep patterns. Changes in appetite or eating patterns. Feeling an overall lack of motivation or enjoyment of activities that you previously enjoyed. Frequent crying spells. Talk with your health care provider if you think that you are experiencing any of these symptoms. General instructions See your health care provider for regular wellness exams and vaccines. This may include: Scheduling regular health, dental, and eye exams. Getting and maintaining your vaccines. These include: Influenza vaccine. Get this vaccine each year before the flu season begins. Pneumonia vaccine. Shingles vaccine. Tetanus, diphtheria, and pertussis (Tdap) booster vaccine. Your health care provider may also recommend other immunizations. Tell your health care provider if you have ever been abused or do not feel safe at home. Summary Menopause is a normal process in which your ability to get pregnant comes to an end. This condition causes hot flashes, night sweats, decreased interest in sex, mood swings, headaches, or lack  of sleep. Treatment for this condition may include hormone replacement therapy. Take actions to keep yourself healthy, including exercising regularly, eating a healthy diet, watching your weight, and checking your blood pressure and blood sugar levels. Get screened for cancer and depression. Make sure that you are up to date with all your vaccines. This information is not intended to replace advice given to you by your health care provider. Make sure you discuss any questions you have with your health care provider. Document Revised: 11/23/2020 Document Reviewed: 11/23/2020 Elsevier Patient Education  [place].
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
## 2022-03-08 NOTE — Assessment & Plan Note (Signed)
Encourage diet and exercise for weight loss 

## 2022-03-17 ENCOUNTER — Other Ambulatory Visit: Payer: Self-pay | Admitting: Internal Medicine

## 2022-03-17 DIAGNOSIS — M1990 Unspecified osteoarthritis, unspecified site: Secondary | ICD-10-CM

## 2022-03-17 MED ORDER — DICLOFENAC SODIUM 75 MG PO TBEC: 75.0000 mg | DELAYED_RELEASE_TABLET | Freq: Two times a day (BID) | ORAL | 1 refills | Status: DC

## 2022-04-16 ENCOUNTER — Other Ambulatory Visit: Payer: Self-pay | Admitting: Internal Medicine

## 2022-04-16 DIAGNOSIS — J302 Other seasonal allergic rhinitis: Secondary | ICD-10-CM

## 2022-04-17 ENCOUNTER — Encounter: Payer: Self-pay | Admitting: Internal Medicine

## 2022-04-18 ENCOUNTER — Telehealth (INDEPENDENT_AMBULATORY_CARE_PROVIDER_SITE_OTHER): Payer: Medicare PPO | Admitting: Internal Medicine

## 2022-04-18 ENCOUNTER — Ambulatory Visit: Payer: Self-pay

## 2022-04-18 ENCOUNTER — Encounter: Payer: Self-pay | Admitting: Internal Medicine

## 2022-04-18 DIAGNOSIS — U071 COVID-19: Secondary | ICD-10-CM | POA: Diagnosis not present

## 2022-04-18 MED ORDER — PREDNISONE 10 MG PO TABS: ORAL_TABLET | ORAL | 0 refills | Status: DC

## 2022-04-18 MED ORDER — PROMETHAZINE-DM 6.25-15 MG/5ML PO SYRP: 5.0000 mL | ORAL_SOLUTION | Freq: Four times a day (QID) | ORAL | 0 refills | Status: DC | PRN

## 2022-04-18 NOTE — Telephone Encounter (Signed)
Patient called, left VM to return the call to the office to discuss symptoms with a nurse.  Summary: DX COVID positive   Patient received the flu and RSV shot on Thursday 9/28 at [facility], patient developed a fever on Thursday and Friday.   Patient was exposed on Saturday 9/30 to [place] and then she was tested confirming she was COVID positive on Saturday.   Patient has no fever, but experiencing nasal congestion, cough, sore throat. Requesting cough medicine.   No available appointments

## 2022-04-18 NOTE — Telephone Encounter (Signed)
Virtual Visit scheduled for today.  Thanks,   -Mickel Baas

## 2022-04-18 NOTE — Telephone Encounter (Signed)
Requested Prescriptions  Pending Prescriptions Disp Refills  . montelukast (SINGULAIR) 10 MG tablet [Pharmacy Med Name: MONTELUKAST '10MG'$  TABLETS] 90 tablet 0    Sig: TAKE 1 TABLET(10 MG) BY MOUTH DAILY WITH SUPPER     Pulmonology:  Leukotriene Inhibitors Passed - 04/16/2022  4:19 PM      Passed - Valid encounter within last 12 months    Recent Outpatient Visits          Today [facility] [place], Coralie Keens, NP   1 month ago Gastroesophageal reflux disease without esophagitis   [facility] [place], Coralie Keens, NP   10 months ago Controlled type 2 diabetes mellitus without complication, without long-term current use of insulin [facility])   [facility] [place], Coralie Keens, NP   1 year ago Gastroesophageal reflux disease without esophagitis   [facility] [place], Coralie Keens, NP   1 year ago Annual physical exam   [facility], Lupita Raider, FNP      Future Appointments            In 4 months Baity, Coralie Keens, NP [facility], [facility]

## 2022-04-18 NOTE — Telephone Encounter (Signed)
Reason for Disposition  [1] HIGH RISK patient (e.g., weak immune system, age > 51 years, obesity with BMI 30 or higher, pregnant, chronic lung disease or other chronic medical condition) AND [2] COVID symptoms (e.g., cough, fever)  (Exceptions: Already seen by PCP and no new or worsening symptoms.)  Answer Assessment - Initial Assessment Questions 1. COVID-19 DIAGNOSIS: "How do you know that you have COVID?" (e.g., positive lab test or self-test, diagnosed by doctor or NP/PA, symptoms after exposure).     Tested Sat.   I had a Covid, RSV and flu shots on Thur. Morning.   I started feeling bad.   I had fever on Thur.   It went away.    On Sat. I heard someone in my choir had Covid.   That's why I tested and it's positive.    I've had pneumonia from prior Covid Jan. 2020. 2. COVID-19 EXPOSURE: "Was there any known exposure to COVID before the symptoms began?" CDC Definition of close contact: within 6 feet (2 meters) for a total of 15 minutes or more over a 24-hour period.       3. ONSET: "When did the COVID-19 symptoms start?"      Thursday fever.     Thursday morning I did not have symptoms.    4. WORST SYMPTOM: "What is your worst symptom?" (e.g., cough, fever, shortness of breath, muscle aches)     Sat runny nose started, sore throat 5. COUGH: "Do you have a cough?" If Yes, ask: "How bad is the cough?"       Coughing a little loose mucus  Dry frequent coughing mostly. 6. FEVER: "Do you have a fever?" If Yes, ask: "What is your temperature, how was it measured, and when did it start?"     Yes no fever now though.    7. RESPIRATORY STATUS: "Describe your breathing?" (e.g., normal; shortness of breath, wheezing, unable to speak)      No short of breath.   I have sarcoidosis in my lungs.   I take medications for it.    I'm breathing normally. 8. BETTER-SAME-WORSE: "Are you getting better, staying the same or getting worse compared to yesterday?"  If getting worse, ask, "In what way?"     Not  asked 9. OTHER SYMPTOMS: "Do you have any other symptoms?"  (e.g., chills, fatigue, headache, loss of smell or taste, muscle pain, sore throat)     I had a headache Sat.  Thought it was my allergies.   The headache has gone away.   My taste is different.  Sore throat, runny nose, coughing. 10. HIGH RISK DISEASE: "Do you have any chronic medical problems?" (e.g., asthma, heart or lung disease, weak immune system, obesity, etc.)       Sarcoidosis and prior Covid   11. VACCINE: "Have you had the COVID-19 vaccine?" If Yes, ask: "Which one, how many shots, when did you get it?"       Yes last one Thursday morning 12. PREGNANCY: "Is there any chance you are pregnant?" "When was your last menstrual period?"       N/A 13. O2 SATURATION MONITOR:  "Do you use an oxygen saturation monitor (pulse oximeter) at home?" If Yes, ask "What is your reading (oxygen level) today?" "What is your usual oxygen saturation reading?" (e.g., 95%)       I have one  but it's packed up.  Protocols used: Coronavirus (NTIRW-43) Diagnosed or Suspected-A-AH  Chief Complaint: Positive Covid Symptoms: Runny nose,  sore throat, altered taste, coughing, fever in the beginning not now. Frequency: Thursday sx started Pertinent Negatives: Patient denies shortness of breath, chest tightness. Disposition: '[]'$ ED /'[]'$ Urgent Care (no appt availability in office) / '[x]'$ Appointment(In office/virtual)/ '[]'$  [place] [facility]/ '[]'$ Home Care/ '[]'$ Refused Recommended Disposition /'[]'$ [place] [facility]/ '[]'$  Follow-up with PCP Additional Notes: No appts available within timeframe needed.   Called into office and spoke with Apolonio Schneiders.    Rollene Fare said pt could be scheduled for 04/19/2022 at 2:00 so Apolonio Schneiders got her scheduled.   Pt agreeable to this.
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
## 2022-04-18 NOTE — Patient Instructions (Signed)

## 2022-04-19 ENCOUNTER — Ambulatory Visit: Payer: Medicare PPO | Admitting: Internal Medicine

## 2022-05-04 ENCOUNTER — Other Ambulatory Visit: Payer: Self-pay | Admitting: Internal Medicine

## 2022-05-04 DIAGNOSIS — Z1231 Encounter for screening mammogram for malignant neoplasm of breast: Secondary | ICD-10-CM

## 2022-05-05 ENCOUNTER — Telehealth: Payer: Self-pay

## 2022-05-05 NOTE — Telephone Encounter (Signed)
If she is requesting an A1c only, this can be done on the nurse schedule

## 2022-05-05 NOTE — Telephone Encounter (Signed)
Copied from [place] [phone]. Topic: Appointment Scheduling - Scheduling Inquiry for Clinic >> May 05, 2022 12:04 PM Tiffany B wrote: Reason for CRM: Patient states PCP wanted her to have labs done only A1C. Patient is starting a new job and her time is limited therefore she would like to come in on 05/09/2022 at 8am. Chart does not reflect lab orders

## 2022-05-17 DIAGNOSIS — I700 Atherosclerosis of aorta: Secondary | ICD-10-CM | POA: Diagnosis not present

## 2022-05-17 DIAGNOSIS — E782 Mixed hyperlipidemia: Secondary | ICD-10-CM | POA: Diagnosis not present

## 2022-05-17 DIAGNOSIS — E669 Obesity, unspecified: Secondary | ICD-10-CM | POA: Diagnosis not present

## 2022-05-17 DIAGNOSIS — I10 Essential (primary) hypertension: Secondary | ICD-10-CM | POA: Diagnosis not present

## 2022-05-17 DIAGNOSIS — D860 Sarcoidosis of lung: Secondary | ICD-10-CM | POA: Diagnosis not present

## 2022-05-17 DIAGNOSIS — E119 Type 2 diabetes mellitus without complications: Secondary | ICD-10-CM | POA: Diagnosis not present

## 2022-05-29 ENCOUNTER — Other Ambulatory Visit: Payer: Self-pay | Admitting: Internal Medicine

## 2022-05-29 DIAGNOSIS — I10 Essential (primary) hypertension: Secondary | ICD-10-CM

## 2022-05-30 NOTE — Telephone Encounter (Signed)
Requested Prescriptions  Pending Prescriptions Disp Refills   losartan (COZAAR) 25 MG tablet [Pharmacy Med Name: LOSARTAN '25MG'$  TABLETS] 90 tablet 0    Sig: TAKE 1 TABLET(25 MG) BY MOUTH DAILY     Cardiovascular:  Angiotensin Receptor Blockers Passed - 05/29/2022 11:08 AM      Passed - Cr in normal range and within 180 days    Creat  Date Value Ref Range Status  06/01/2021 0.67 0.60 - 1.00 mg/dL Final   Creatinine, Ser  Date Value Ref Range Status  01/21/2022 0.51 0.44 - 1.00 mg/dL Final         Passed - K in normal range and within 180 days    Potassium  Date Value Ref Range Status  01/21/2022 3.9 3.5 - 5.1 mmol/L Final         Passed - Patient is not pregnant      Passed - Last BP in normal range    BP Readings from Last 1 Encounters:  03/08/22 138/64         Passed - Valid encounter within last 6 months    Recent Outpatient Visits           1 month ago [facility] [place], [place], [place]   2 months ago Gastroesophageal reflux disease without esophagitis   [facility] [place], [place], NP   12 months ago Controlled type 2 diabetes mellitus without complication, without long-term current use of insulin [facility])   [facility] [place], Coralie Keens, NP   1 year ago Gastroesophageal reflux disease without esophagitis   [facility] [place], Coralie Keens, NP   2 years ago Annual physical exam   [facility], Lupita Raider, FNP       Future Appointments             In 3 months Baity, Coralie Keens, NP [facility], [facility]

## 2022-06-14 DIAGNOSIS — H43813 Vitreous degeneration, bilateral: Secondary | ICD-10-CM | POA: Diagnosis not present

## 2022-06-14 DIAGNOSIS — Z0100 Encounter for examination of eyes and vision without abnormal findings: Secondary | ICD-10-CM | POA: Diagnosis not present

## 2022-06-14 LAB — HM DIABETES EYE EXAM

## 2022-06-23 ENCOUNTER — Ambulatory Visit
Admission: RE | Admit: 2022-06-23 | Discharge: 2022-06-23 | Disposition: A | Discharge status: Home / Self Care | Payer: Medicare PPO | Source: Ambulatory Visit | Attending: Internal Medicine | Admitting: Internal Medicine

## 2022-06-23 DIAGNOSIS — Z1231 Encounter for screening mammogram for malignant neoplasm of breast: Secondary | ICD-10-CM | POA: Insufficient documentation

## 2022-07-03 DIAGNOSIS — Z8249 Family history of ischemic heart disease and other diseases of the circulatory system: Secondary | ICD-10-CM | POA: Diagnosis not present

## 2022-07-03 DIAGNOSIS — I10 Essential (primary) hypertension: Secondary | ICD-10-CM | POA: Diagnosis not present

## 2022-07-03 DIAGNOSIS — E119 Type 2 diabetes mellitus without complications: Secondary | ICD-10-CM | POA: Diagnosis not present

## 2022-07-03 DIAGNOSIS — Z7982 Long term (current) use of aspirin: Secondary | ICD-10-CM | POA: Diagnosis not present

## 2022-07-03 DIAGNOSIS — J302 Other seasonal allergic rhinitis: Secondary | ICD-10-CM | POA: Diagnosis not present

## 2022-07-03 DIAGNOSIS — E785 Hyperlipidemia, unspecified: Secondary | ICD-10-CM | POA: Diagnosis not present

## 2022-07-03 DIAGNOSIS — D860 Sarcoidosis of lung: Secondary | ICD-10-CM | POA: Diagnosis not present

## 2022-07-03 DIAGNOSIS — K219 Gastro-esophageal reflux disease without esophagitis: Secondary | ICD-10-CM | POA: Diagnosis not present

## 2022-07-03 DIAGNOSIS — J449 Chronic obstructive pulmonary disease, unspecified: Secondary | ICD-10-CM | POA: Diagnosis not present

## 2022-08-24 DIAGNOSIS — J4520 Mild intermittent asthma, uncomplicated: Secondary | ICD-10-CM | POA: Diagnosis not present

## 2022-08-24 DIAGNOSIS — D860 Sarcoidosis of lung: Secondary | ICD-10-CM | POA: Diagnosis not present

## 2022-09-04 ENCOUNTER — Other Ambulatory Visit: Payer: Self-pay | Admitting: Internal Medicine

## 2022-09-04 DIAGNOSIS — K219 Gastro-esophageal reflux disease without esophagitis: Secondary | ICD-10-CM

## 2022-09-05 NOTE — Telephone Encounter (Signed)
Requested Prescriptions  Pending Prescriptions Disp Refills   famotidine (PEPCID) 20 MG tablet [Pharmacy Med Name: FAMOTIDINE 20MG TABLETS] 90 tablet 2    Sig: TAKE 1 TABLET(20 MG) BY MOUTH AT BEDTIME     Gastroenterology:  H2 Antagonists Passed - 09/04/2022  7:24 AM      Passed - Valid encounter within last 12 months    Recent Outpatient Visits           4 months ago [facility] [place], [place] W, NP   6 months ago Gastroesophageal reflux disease without esophagitis   [facility] [place], [place] W, NP   1 year ago Controlled type 2 diabetes mellitus without complication, without long-term current use of insulin [facility])   [facility] [place], [place], NP   1 year ago Gastroesophageal reflux disease without esophagitis   [facility] [place], Coralie Keens, NP   2 years ago Annual physical exam   [facility] [place], Lupita Raider, FNP       Future Appointments             In 1 week Baity, Coralie Keens, NP [facility], [facility]

## 2022-09-07 ENCOUNTER — Other Ambulatory Visit: Payer: Self-pay | Admitting: Internal Medicine

## 2022-09-07 DIAGNOSIS — M1990 Unspecified osteoarthritis, unspecified site: Secondary | ICD-10-CM

## 2022-09-07 NOTE — Telephone Encounter (Signed)
Requested Prescriptions  Pending Prescriptions Disp Refills   diclofenac (VOLTAREN) 75 MG EC tablet [Pharmacy Med Name: DICLOFENAC SODIUM 75MG DR TABLETS] 180 tablet 0    Sig: TAKE 1 TABLET(75 MG) BY MOUTH TWICE DAILY     Analgesics:  NSAIDS Failed - 09/07/2022  2:41 PM      Failed - Manual Review: Labs are only required if the patient has taken medication for more than 8 weeks.      Passed - Cr in normal range and within 360 days    Creat  Date Value Ref Range Status  06/01/2021 0.67 0.60 - 1.00 mg/dL Final   Creatinine, Ser  Date Value Ref Range Status  01/21/2022 0.51 0.44 - 1.00 mg/dL Final         Passed - HGB in normal range and within 360 days    Hemoglobin  Date Value Ref Range Status  01/21/2022 12.6 12.0 - 15.0 g/dL Final  10/27/2015 13.5 11.1 - 15.9 g/dL Final         Passed - PLT in normal range and within 360 days    Platelets  Date Value Ref Range Status  01/21/2022 344 150 - 400 K/uL Final  10/27/2015 417 (H) 150 - 379 x10E3/uL Final         Passed - HCT in normal range and within 360 days    HCT  Date Value Ref Range Status  01/21/2022 39.0 36.0 - 46.0 % Final   Hematocrit  Date Value Ref Range Status  10/27/2015 39.3 34.0 - 46.6 % Final         Passed - eGFR is 30 or above and within 360 days    GFR, Est African American  Date Value Ref Range Status  05/19/2020 106 > OR = 60 mL/min/1.33m Final   GFR, Est Non African American  Date Value Ref Range Status  05/19/2020 91 > OR = 60 mL/min/1.767mFinal   GFR, Estimated  Date Value Ref Range Status  01/21/2022 >60 >60 mL/min Final    Comment:    (NOTE) Calculated using the CKD-EPI Creatinine Equation (2021)    eGFR  Date Value Ref Range Status  06/01/2021 93 > OR = 60 mL/min/1.7329minal    Comment:    The eGFR is based on the CKD-EPI 2021 equation. To calculate  the new eGFR from a previous Creatinine or Cystatin C result, go to  https://www.kidney.org/professionals/ kdoqi/gfr%5Fcalculator          Passed - Patient is not pregnant      Passed - Valid encounter within last 12 months    Recent Outpatient Visits           [facility] [place] [facility]   6 months ago Gastroesophageal reflux disease without esophagitis   [facility]   1 year ago Controlled type 2 diabetes mellitus without complication, without long-term current use of insulin [facility]   1 year ago Gastroesophageal reflux disease without esophagitis   [facility]   2 years ago Annual physical exam   [facility] [place] [facility]       Future Appointments             In 1 week [place]  [facility], [facility]

## 2022-09-14 ENCOUNTER — Telehealth: Payer: Self-pay | Admitting: Internal Medicine

## 2022-09-14 ENCOUNTER — Ambulatory Visit: Payer: Medicare PPO | Admitting: Internal Medicine

## 2022-09-14 ENCOUNTER — Encounter: Payer: Self-pay | Admitting: Internal Medicine

## 2022-09-14 VITALS — BP 122/62 | HR 92 | Temp 96.8°F | Wt 165.0 lb

## 2022-09-14 DIAGNOSIS — Z6830 Body mass index (BMI) 30.0-30.9, adult: Secondary | ICD-10-CM

## 2022-09-14 DIAGNOSIS — Z780 Asymptomatic menopausal state: Secondary | ICD-10-CM

## 2022-09-14 DIAGNOSIS — J4520 Mild intermittent asthma, uncomplicated: Secondary | ICD-10-CM | POA: Diagnosis not present

## 2022-09-14 DIAGNOSIS — I10 Essential (primary) hypertension: Secondary | ICD-10-CM

## 2022-09-14 DIAGNOSIS — E782 Mixed hyperlipidemia: Secondary | ICD-10-CM | POA: Diagnosis not present

## 2022-09-14 DIAGNOSIS — Z23 Encounter for immunization: Secondary | ICD-10-CM

## 2022-09-14 DIAGNOSIS — E6609 Other obesity due to excess calories: Secondary | ICD-10-CM

## 2022-09-14 DIAGNOSIS — K219 Gastro-esophageal reflux disease without esophagitis: Secondary | ICD-10-CM

## 2022-09-14 DIAGNOSIS — M8580 Other specified disorders of bone density and structure, unspecified site: Secondary | ICD-10-CM

## 2022-09-14 DIAGNOSIS — E119 Type 2 diabetes mellitus without complications: Secondary | ICD-10-CM

## 2022-09-14 DIAGNOSIS — D860 Sarcoidosis of lung: Secondary | ICD-10-CM

## 2022-09-14 DIAGNOSIS — M791 Myalgia, unspecified site: Secondary | ICD-10-CM

## 2022-09-14 DIAGNOSIS — I700 Atherosclerosis of aorta: Secondary | ICD-10-CM | POA: Diagnosis not present

## 2022-09-14 DIAGNOSIS — T466X5A Adverse effect of antihyperlipidemic and antiarteriosclerotic drugs, initial encounter: Secondary | ICD-10-CM

## 2022-09-14 DIAGNOSIS — M1990 Unspecified osteoarthritis, unspecified site: Secondary | ICD-10-CM

## 2022-09-14 LAB — POCT GLYCOSYLATED HEMOGLOBIN (HGB A1C): HbA1c, POC (controlled diabetic range): 6.5 % (ref 0.0–7.0)

## 2022-09-14 MED ORDER — ACCU-CHEK SOFTCLIX LANCETS MISC: 1 refills | Status: DC

## 2022-09-14 MED ORDER — ACCU-CHEK AVIVA PLUS VI STRP: ORAL_STRIP | 1 refills | Status: DC

## 2022-09-14 NOTE — Assessment & Plan Note (Signed)
Avoid foods that trigger reflux Encourage weight loss as this can help reduce reflux symptoms Continue famotidine
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
## 2022-09-14 NOTE — Assessment & Plan Note (Signed)
Continue Flovent and albuterol

## 2022-09-14 NOTE — Assessment & Plan Note (Signed)
Encouraged diet and exercise for weight loss ?

## 2022-09-14 NOTE — Assessment & Plan Note (Signed)
Encourage weight loss as this can help reduce joint pain Continue diclofenac as needed

## 2022-09-14 NOTE — Assessment & Plan Note (Signed)
POCT A1c 6.5% We will check urine microalbumin today Encourage low-carb diet and exercise for weight loss Encourage routine eye exam Encouraged routine foot exam Flu shot UTD Pneumovax and Prevnar 13 UTD Prevnar 20 today COVID UTD

## 2022-09-14 NOTE — Telephone Encounter (Signed)
Pt went home and checked her monitor and it is Accu chek guide monitor that she has / pt had appt this morning and wanted to get this info to the nurse before she sent RX to the pharmacy  / please advise

## 2022-09-14 NOTE — Assessment & Plan Note (Signed)
CMET and lipid profile today Encouraged her to consume a low fat diet Statin intolerant  Continue aspirin

## 2022-09-14 NOTE — Assessment & Plan Note (Signed)
Cmet and lipid profile today Statin intolerant  Encouraged her to consume a low fat diet

## 2022-09-14 NOTE — Assessment & Plan Note (Signed)
Controlled on Losartan Reinforced DASH diet and exercise for weight loss CMET today

## 2022-09-14 NOTE — Assessment & Plan Note (Signed)
Encourage daily weightbearing exercise Continue calcium and vitamin D

## 2022-09-14 NOTE — Patient Instructions (Signed)

## 2022-09-14 NOTE — Assessment & Plan Note (Signed)
Cmet and lipid profile today

## 2022-09-15 LAB — COMPLETE METABOLIC PANEL WITH GFR
AG Ratio: 1.8 (calc) (ref 1.0–2.5)
ALT: 16 U/L (ref 6–29)
AST: 19 U/L (ref 10–35)
Albumin: 4.2 g/dL (ref 3.6–5.1)
Alkaline phosphatase (APISO): 72 U/L (ref 37–153)
BUN/Creatinine Ratio: 31 (calc) — ABNORMAL HIGH (ref 6–22)
BUN: 17 mg/dL (ref 7–25)
CO2: 29 mmol/L (ref 20–32)
Calcium: 9.6 mg/dL (ref 8.6–10.4)
Chloride: 105 mmol/L (ref 98–110)
Creat: 0.54 mg/dL — ABNORMAL LOW (ref 0.60–1.00)
Globulin: 2.3 g/dL (calc) (ref 1.9–3.7)
Glucose, Bld: 97 mg/dL (ref 65–99)
Potassium: 4.2 mmol/L (ref 3.5–5.3)
Sodium: 143 mmol/L (ref 135–146)
Total Bilirubin: 0.3 mg/dL (ref 0.2–1.2)
Total Protein: 6.5 g/dL (ref 6.1–8.1)
eGFR: 97 mL/min/{1.73_m2} (ref >=60)

## 2022-09-15 LAB — CBC
HCT: 38.2 % (ref 35.0–45.0)
Hemoglobin: 12.6 g/dL (ref 11.7–15.5)
MCH: 30.3 pg (ref 27.0–33.0)
MCHC: 33 g/dL (ref 32.0–36.0)
MCV: 91.8 fL (ref 80.0–100.0)
MPV: 10.2 fL (ref 7.5–12.5)
Platelets: 379 10*3/uL (ref 140–400)
RBC: 4.16 10*6/uL (ref 3.80–5.10)
RDW: 12.8 % (ref 11.0–15.0)
WBC: 7.6 10*3/uL (ref 3.8–10.8)

## 2022-09-15 LAB — LIPID PANEL
Cholesterol: 262 mg/dL — ABNORMAL HIGH (ref <200)
HDL: 59 mg/dL (ref >=50)
LDL Cholesterol (Calc): 156 mg/dL (calc) — ABNORMAL HIGH
Non-HDL Cholesterol (Calc): 203 mg/dL (calc) — ABNORMAL HIGH (ref <130)
Total CHOL/HDL Ratio: 4.4 (calc) (ref <5.0)
Triglycerides: 285 mg/dL — ABNORMAL HIGH (ref <150)

## 2022-09-15 LAB — MICROALBUMIN / CREATININE URINE RATIO
Creatinine, Urine: 27 mg/dL (ref 20–275)
Microalb Creat Ratio: 11 mcg/mg creat (ref <30)
Microalb, Ur: 0.3 mg/dL

## 2022-09-15 NOTE — Addendum Note (Signed)
Addended by: Kizzie Furnish on: 09/15/2022 09:16 AM   Modules accepted: Orders

## 2022-09-20 ENCOUNTER — Telehealth: Payer: Self-pay | Admitting: Internal Medicine

## 2022-09-20 NOTE — Telephone Encounter (Signed)
Called patient to schedule Medicare Annual Wellness Visit (AWV). Left message for patient to call back and schedule Medicare Annual Wellness Visit (AWV).  Last date of AWV: 10/05/2021  Please schedule an appointment at any time with Kirke Shaggy, NHA  .  If any questions, please contact me.  Thank you ,  Sherol Dade; [place] Direct Dial: [phone]

## 2022-09-20 NOTE — Telephone Encounter (Signed)
Contacted Charlena C Ganim to schedule their annual wellness visit. Appointment made for 10/20/2022.  Sherol Dade; [facility] Direct Dial: [phone]

## 2022-10-20 ENCOUNTER — Ambulatory Visit (INDEPENDENT_AMBULATORY_CARE_PROVIDER_SITE_OTHER): Payer: Medicare PPO

## 2022-10-20 VITALS — Wt 165.0 lb

## 2022-10-20 DIAGNOSIS — Z0000 Encounter for general adult medical examination without abnormal findings: Secondary | ICD-10-CM | POA: Diagnosis not present

## 2022-10-20 NOTE — Patient Instructions (Signed)
Mary Burke , Thank you for taking time to come for your Medicare Wellness Visit. I appreciate your ongoing commitment to your health goals. Please review the following plan we discussed and let me know if I can assist you in the future.   These are the goals we discussed:  Goals      DIET - EAT MORE FRUITS AND VEGETABLES     DIET - REDUCE SUGAR INTAKE     Would like to decrease amount of gluten, sugars and carbs     Lose Weight      Would like to be below 160 lbs. Also would like to get more sleep.      Patient Stated     09/29/2020, wants to lose 10 pounds        This is a list of the screening recommended for you and due dates:  Health Maintenance  Topic Date Due   COVID-19 Vaccine (6 - 2023-24 season) 03/18/2022   Colon Cancer Screening  02/03/2023   Flu Shot  02/16/2023   DTaP/Tdap/Td vaccine (2 - Td or Tdap) 02/19/2023   Hemoglobin A1C  03/15/2023   Eye exam for diabetics  06/15/2023   Yearly kidney function blood test for diabetes  09/15/2023   Yearly kidney health urinalysis for diabetes  09/15/2023   Complete foot exam   09/15/2023   Medicare Annual Wellness Visit  10/20/2023   Mammogram  06/23/2024   Pneumonia Vaccine  Completed   DEXA scan (bone density measurement)  Completed   Hepatitis C Screening: USPSTF Recommendation to screen - Ages 31-79 yo.  Completed   Zoster (Shingles) Vaccine  Completed   HPV Vaccine  Aged Out    Advanced directives: no  Conditions/risks identified: none  Next appointment: Follow up in one year for your annual wellness visit 10/26/23 @ 1:00 pm by phone   Preventive Care 65 Years and Older, Female Preventive care refers to lifestyle choices and visits with your health care provider that can promote health and wellness. What does preventive care include? A yearly physical exam. This is also called an annual well check. Dental exams once or twice a year. Routine eye exams. Ask your health care provider how often you should have  your eyes checked. Personal lifestyle choices, including: Daily care of your teeth and gums. Regular physical activity. Eating a healthy diet. Avoiding tobacco and drug use. Limiting alcohol use. Practicing safe sex. Taking low-dose aspirin every day. Taking vitamin and mineral supplements as recommended by your health care provider. What happens during an annual well check? The services and screenings done by your health care provider during your annual well check will depend on your age, overall health, lifestyle risk factors, and family history of disease. Counseling  Your health care provider may ask you questions about your: Alcohol use. Tobacco use. Drug use. Emotional well-being. Home and relationship well-being. Sexual activity. Eating habits. History of falls. Memory and ability to understand (cognition). Work and work Statistician. Reproductive health. Screening  You may have the following tests or measurements: Height, weight, and BMI. Blood pressure. Lipid and cholesterol levels. These may be checked every 5 years, or more frequently if you are over 40 years old. Skin check. Lung cancer screening. You may have this screening every year starting at age 37 if you have a 30-pack-year history of smoking and currently smoke or have quit within the past 15 years. Fecal occult blood test (FOBT) of the stool. You may have this test every year  starting at age 32. Flexible sigmoidoscopy or colonoscopy. You may have a sigmoidoscopy every 5 years or a colonoscopy every 10 years starting at age 46. Hepatitis C blood test. Hepatitis B blood test. Sexually transmitted disease (STD) testing. Diabetes screening. This is done by checking your blood sugar (glucose) after you have not eaten for a while (fasting). You may have this done every 1-3 years. Bone density scan. This is done to screen for osteoporosis. You may have this done starting at age 73. Mammogram. This may be done every  1-2 years. Talk to your health care provider about how often you should have regular mammograms. Talk with your health care provider about your test results, treatment options, and if necessary, the need for more tests. Vaccines  Your health care provider may recommend certain vaccines, such as: Influenza vaccine. This is recommended every year. Tetanus, diphtheria, and acellular pertussis (Tdap, Td) vaccine. You may need a Td booster every 10 years. Zoster vaccine. You may need this after age 74. Pneumococcal 13-valent conjugate (PCV13) vaccine. One dose is recommended after age 54. Pneumococcal polysaccharide (PPSV23) vaccine. One dose is recommended after age 55. Talk to your health care provider about which screenings and vaccines you need and how often you need them. This information is not intended to replace advice given to you by your health care provider. Make sure you discuss any questions you have with your health care provider. Document Released: 07/31/2015 Document Revised: 03/23/2016 Document Reviewed: 05/05/2015 Elsevier Interactive Patient Education  2017 [place] Prevention in the Home Falls can cause injuries. They can happen to people of all ages. There are many things you can do to make your home safe and to help prevent falls. What can I do on the outside of my home? Regularly fix the edges of walkways and driveways and fix any cracks. Remove anything that might make you trip as you walk through a door, such as a raised step or threshold. Trim any bushes or trees on the path to your home. Use bright outdoor lighting. Clear any walking paths of anything that might make someone trip, such as rocks or tools. Regularly check to see if handrails are loose or broken. Make sure that both sides of any steps have handrails. Any raised decks and porches should have guardrails on the edges. Have any leaves, snow, or ice cleared regularly. Use sand or salt on walking  paths during winter. Clean up any spills in your garage right away. This includes oil or grease spills. What can I do in the bathroom? Use night lights. Install grab bars by the toilet and in the tub and shower. Do not use towel bars as grab bars. Use non-skid mats or decals in the tub or shower. If you need to sit down in the shower, use a plastic, non-slip stool. Keep the floor dry. Clean up any water that spills on the floor as soon as it happens. Remove soap buildup in the tub or shower regularly. Attach bath mats securely with double-sided non-slip rug tape. Do not have throw rugs and other things on the floor that can make you trip. What can I do in the bedroom? Use night lights. Make sure that you have a light by your bed that is easy to reach. Do not use any sheets or blankets that are too big for your bed. They should not hang down onto the floor. Have a firm chair that has side arms. You can use this for  support while you get dressed. Do not have throw rugs and other things on the floor that can make you trip. What can I do in the kitchen? Clean up any spills right away. Avoid walking on wet floors. Keep items that you use a lot in easy-to-reach places. If you need to reach something above you, use a strong step stool that has a grab bar. Keep electrical cords out of the way. Do not use floor polish or wax that makes floors slippery. If you must use wax, use non-skid floor wax. Do not have throw rugs and other things on the floor that can make you trip. What can I do with my stairs? Do not leave any items on the stairs. Make sure that there are handrails on both sides of the stairs and use them. Fix handrails that are broken or loose. Make sure that handrails are as long as the stairways. Check any carpeting to make sure that it is firmly attached to the stairs. Fix any carpet that is loose or worn. Avoid having throw rugs at the top or bottom of the stairs. If you do have  throw rugs, attach them to the floor with carpet tape. Make sure that you have a light switch at the top of the stairs and the bottom of the stairs. If you do not have them, ask someone to add them for you. What else can I do to help prevent falls? Wear shoes that: Do not have high heels. Have rubber bottoms. Are comfortable and fit you well. Are closed at the toe. Do not wear sandals. If you use a stepladder: Make sure that it is fully opened. Do not climb a closed stepladder. Make sure that both sides of the stepladder are locked into place. Ask someone to hold it for you, if possible. Clearly mark and make sure that you can see: Any grab bars or handrails. First and last steps. Where the edge of each step is. Use tools that help you move around (mobility aids) if they are needed. These include: Canes. Walkers. Scooters. Crutches. Turn on the lights when you go into a dark area. Replace any light bulbs as soon as they burn out. Set up your furniture so you have a clear path. Avoid moving your furniture around. If any of your floors are uneven, fix them. If there are any pets around you, be aware of where they are. Review your medicines with your doctor. Some medicines can make you feel dizzy. This can increase your chance of falling. Ask your doctor what other things that you can do to help prevent falls. This information is not intended to replace advice given to you by your health care provider. Make sure you discuss any questions you have with your health care provider. Document Released: 04/30/2009 Document Revised: 12/10/2015 Document Reviewed: 08/08/2014 Elsevier Interactive Patient Education  2017 [employer].
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
## 2022-10-23 ENCOUNTER — Other Ambulatory Visit: Payer: Self-pay | Admitting: Internal Medicine

## 2022-10-23 DIAGNOSIS — I10 Essential (primary) hypertension: Secondary | ICD-10-CM

## 2022-10-25 NOTE — Telephone Encounter (Signed)
Requested Prescriptions  Pending Prescriptions Disp Refills   losartan (COZAAR) 25 MG tablet [Pharmacy Med Name: LOSARTAN 25MG  TABLETS] 90 tablet 0    Sig: TAKE 1 TABLET(25 MG) BY MOUTH DAILY     Cardiovascular:  Angiotensin Receptor Blockers Failed - 10/23/2022  7:38 PM      Failed - Cr in normal range and within 180 days    Creat  Date Value Ref Range Status  09/14/2022 0.54 (L) 0.60 - 1.00 mg/dL Final   Creatinine, Urine  Date Value Ref Range Status  09/14/2022 27 20 - 275 mg/dL Final         Passed - K in normal range and within 180 days    Potassium  Date Value Ref Range Status  09/14/2022 4.2 3.5 - 5.3 mmol/L Final         Passed - Patient is not pregnant      Passed - Last BP in normal range    BP Readings from Last 1 Encounters:  09/14/22 122/62         Passed - Valid encounter within last 6 months    Recent Outpatient Visits           1 month ago Controlled type 2 diabetes mellitus without complication, without long-term current use of insulin [facility])   [place] [facility] [place], Salvadore Oxford, NP   6 months ago COVID-19   [facility] [place], Salvadore Oxford, NP   7 months ago Encounter for general adult medical examination with abnormal findings   [place] [facility] [place], [place] W, NP   1 year ago Controlled type 2 diabetes mellitus without complication, without long-term current use of insulin [facility])   [place] [facility] [place], [place], NP   1 year ago Gastroesophageal reflux disease without esophagitis   [place] [facility] [place], Salvadore Oxford, NP       Future Appointments             In 4 months [place], Salvadore Oxford, NP [place] [facility], [facility]

## 2022-12-29 ENCOUNTER — Other Ambulatory Visit: Payer: Self-pay | Admitting: Internal Medicine

## 2022-12-29 DIAGNOSIS — M1990 Unspecified osteoarthritis, unspecified site: Secondary | ICD-10-CM

## 2023-02-22 DIAGNOSIS — R0602 Shortness of breath: Secondary | ICD-10-CM | POA: Diagnosis not present

## 2023-02-22 DIAGNOSIS — J4520 Mild intermittent asthma, uncomplicated: Secondary | ICD-10-CM | POA: Diagnosis not present

## 2023-02-22 DIAGNOSIS — D860 Sarcoidosis of lung: Secondary | ICD-10-CM | POA: Diagnosis not present

## 2023-03-16 ENCOUNTER — Ambulatory Visit (INDEPENDENT_AMBULATORY_CARE_PROVIDER_SITE_OTHER): Payer: Medicare PPO | Admitting: Internal Medicine

## 2023-03-16 ENCOUNTER — Encounter: Payer: Self-pay | Admitting: Internal Medicine

## 2023-03-16 VITALS — BP 122/68 | HR 94 | Temp 96.8°F | Ht 62.0 in | Wt 166.0 lb

## 2023-03-16 DIAGNOSIS — Z0001 Encounter for general adult medical examination with abnormal findings: Secondary | ICD-10-CM | POA: Diagnosis not present

## 2023-03-16 DIAGNOSIS — Z780 Asymptomatic menopausal state: Secondary | ICD-10-CM | POA: Diagnosis not present

## 2023-03-16 DIAGNOSIS — Z6830 Body mass index (BMI) 30.0-30.9, adult: Secondary | ICD-10-CM

## 2023-03-16 DIAGNOSIS — E119 Type 2 diabetes mellitus without complications: Secondary | ICD-10-CM

## 2023-03-16 DIAGNOSIS — E6609 Other obesity due to excess calories: Secondary | ICD-10-CM | POA: Diagnosis not present

## 2023-03-16 DIAGNOSIS — Z1211 Encounter for screening for malignant neoplasm of colon: Secondary | ICD-10-CM | POA: Diagnosis not present

## 2023-03-16 DIAGNOSIS — E782 Mixed hyperlipidemia: Secondary | ICD-10-CM | POA: Diagnosis not present

## 2023-03-16 DIAGNOSIS — Z1231 Encounter for screening mammogram for malignant neoplasm of breast: Secondary | ICD-10-CM | POA: Diagnosis not present

## 2023-03-16 NOTE — Patient Instructions (Signed)
Health Maintenance for Postmenopausal Women Menopause is a normal process in which your ability to get pregnant comes to an end. This process happens slowly over many months or years, usually between the ages of 48 and 55. Menopause is complete when you have missed your menstrual period for 12 months. It is important to talk with your health care provider about some of the most common conditions that affect women after menopause (postmenopausal women). These include heart disease, cancer, and bone loss (osteoporosis). Adopting a healthy lifestyle and getting preventive care can help to promote your health and wellness. The actions you take can also lower your chances of developing some of these common conditions. What are the signs and symptoms of menopause? During menopause, you may have the following symptoms: Hot flashes. These can be moderate or severe. Night sweats. Decrease in sex drive. Mood swings. Headaches. Tiredness (fatigue). Irritability. Memory problems. Problems falling asleep or staying asleep. Talk with your health care provider about treatment options for your symptoms. Do I need hormone replacement therapy? Hormone replacement therapy is effective in treating symptoms that are caused by menopause, such as hot flashes and night sweats. Hormone replacement carries certain risks, especially as you become older. If you are thinking about using estrogen or estrogen with progestin, discuss the benefits and risks with your health care provider. How can I reduce my risk for heart disease and stroke? The risk of heart disease, heart attack, and stroke increases as you age. One of the causes may be a change in the body's hormones during menopause. This can affect how your body uses dietary fats, triglycerides, and cholesterol. Heart attack and stroke are medical emergencies. There are many things that you can do to help prevent heart disease and stroke. Watch your blood pressure High  blood pressure causes heart disease and increases the risk of stroke. This is more likely to develop in people who have high blood pressure readings or are overweight. Have your blood pressure checked: Every 3-5 years if you are 18-39 years of age. Every year if you are 40 years old or older. Eat a healthy diet  Eat a diet that includes plenty of vegetables, fruits, low-fat dairy products, and lean protein. Do not eat a lot of foods that are high in solid fats, added sugars, or sodium. Get regular exercise Get regular exercise. This is one of the most important things you can do for your health. Most adults should: Try to exercise for at least 150 minutes each week. The exercise should increase your heart rate and make you sweat (moderate-intensity exercise). Try to do strengthening exercises at least twice each week. Do these in addition to the moderate-intensity exercise. Spend less time sitting. Even light physical activity can be beneficial. Other tips Work with your health care provider to achieve or maintain a healthy weight. Do not use any products that contain nicotine or tobacco. These products include cigarettes, chewing tobacco, and vaping devices, such as e-cigarettes. If you need help quitting, ask your health care provider. Know your numbers. Ask your health care provider to check your cholesterol and your blood sugar (glucose). Continue to have your blood tested as directed by your health care provider. Do I need screening for cancer? Depending on your health history and family history, you may need to have cancer screenings at different stages of your life. This may include screening for: Breast cancer. Cervical cancer. Lung cancer. Colorectal cancer. What is my risk for osteoporosis? After menopause, you may be   at increased risk for osteoporosis. Osteoporosis is a condition in which bone destruction happens more quickly than new bone creation. To help prevent osteoporosis or  the bone fractures that can happen because of osteoporosis, you may take the following actions: If you are 19-50 years old, get at least 1,000 mg of calcium and at least 600 international units (IU) of vitamin D per day. If you are older than age 50 but younger than age 70, get at least 1,200 mg of calcium and at least 600 international units (IU) of vitamin D per day. If you are older than age 70, get at least 1,200 mg of calcium and at least 800 international units (IU) of vitamin D per day. Smoking and drinking excessive alcohol increase the risk of osteoporosis. Eat foods that are rich in calcium and vitamin D, and do weight-bearing exercises several times each week as directed by your health care provider. How does menopause affect my mental health? Depression may occur at any age, but it is more common as you become older. Common symptoms of depression include: Feeling depressed. Changes in sleep patterns. Changes in appetite or eating patterns. Feeling an overall lack of motivation or enjoyment of activities that you previously enjoyed. Frequent crying spells. Talk with your health care provider if you think that you are experiencing any of these symptoms. General instructions See your health care provider for regular wellness exams and vaccines. This may include: Scheduling regular health, dental, and eye exams. Getting and maintaining your vaccines. These include: Influenza vaccine. Get this vaccine each year before the flu season begins. Pneumonia vaccine. Shingles vaccine. Tetanus, diphtheria, and pertussis (Tdap) booster vaccine. Your health care provider may also recommend other immunizations. Tell your health care provider if you have ever been abused or do not feel safe at home. Summary Menopause is a normal process in which your ability to get pregnant comes to an end. This condition causes hot flashes, night sweats, decreased interest in sex, mood swings, headaches, or lack  of sleep. Treatment for this condition may include hormone replacement therapy. Take actions to keep yourself healthy, including exercising regularly, eating a healthy diet, watching your weight, and checking your blood pressure and blood sugar levels. Get screened for cancer and depression. Make sure that you are up to date with all your vaccines. This information is not intended to replace advice given to you by your health care provider. Make sure you discuss any questions you have with your health care provider. Document Revised: 11/23/2020 Document Reviewed: 11/23/2020 Elsevier Patient Education  2024 [employer]  
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
## 2023-03-16 NOTE — Assessment & Plan Note (Signed)
Encouraged diet and exercise for weight loss ?

## 2023-03-17 ENCOUNTER — Encounter: Payer: Self-pay | Admitting: Internal Medicine

## 2023-03-17 LAB — COMPLETE METABOLIC PANEL WITH GFR
AG Ratio: 2 (calc) (ref 1.0–2.5)
ALT: 16 U/L (ref 6–29)
AST: 19 U/L (ref 10–35)
Albumin: 4.1 g/dL (ref 3.6–5.1)
Alkaline phosphatase (APISO): 58 U/L (ref 37–153)
BUN: 14 mg/dL (ref 7–25)
CO2: 25 mmol/L (ref 20–32)
Calcium: 9.6 mg/dL (ref 8.6–10.4)
Chloride: 104 mmol/L (ref 98–110)
Creat: 0.63 mg/dL (ref 0.60–1.00)
Globulin: 2.1 g/dL (ref 1.9–3.7)
Glucose, Bld: 87 mg/dL (ref 65–139)
Potassium: 4.4 mmol/L (ref 3.5–5.3)
Sodium: 139 mmol/L (ref 135–146)
Total Bilirubin: 0.4 mg/dL (ref 0.2–1.2)
Total Protein: 6.2 g/dL (ref 6.1–8.1)
eGFR: 93 mL/min/{1.73_m2} (ref >=60)

## 2023-03-17 LAB — CBC
HCT: 39.1 % (ref 35.0–45.0)
Hemoglobin: 12.9 g/dL (ref 11.7–15.5)
MCH: 30.2 pg (ref 27.0–33.0)
MCHC: 33 g/dL (ref 32.0–36.0)
MCV: 91.6 fL (ref 80.0–100.0)
MPV: 9.9 fL (ref 7.5–12.5)
Platelets: 372 10*3/uL (ref 140–400)
RBC: 4.27 10*6/uL (ref 3.80–5.10)
RDW: 12.7 % (ref 11.0–15.0)
WBC: 10.1 10*3/uL (ref 3.8–10.8)

## 2023-03-17 LAB — HEMOGLOBIN A1C
Hgb A1c MFr Bld: 6.1 %{Hb} — ABNORMAL HIGH (ref <5.7)
Mean Plasma Glucose: 128 mg/dL
eAG (mmol/L): 7.1 mmol/L

## 2023-03-17 LAB — LIPID PANEL
Cholesterol: 237 mg/dL — ABNORMAL HIGH (ref <200)
HDL: 60 mg/dL (ref >=50)
LDL Cholesterol (Calc): 141 mg/dL — ABNORMAL HIGH
Non-HDL Cholesterol (Calc): 177 mg/dL — ABNORMAL HIGH (ref <130)
Total CHOL/HDL Ratio: 4 (calc) (ref <5.0)
Triglycerides: 222 mg/dL — ABNORMAL HIGH (ref <150)

## 2023-03-27 ENCOUNTER — Encounter: Payer: Self-pay | Admitting: *Deleted

## 2023-03-29 ENCOUNTER — Telehealth: Payer: Self-pay

## 2023-03-29 NOTE — Telephone Encounter (Signed)
I was in the middle of speaking to patient and got disconnected

## 2023-03-29 NOTE — Telephone Encounter (Signed)
Patient called back to schedule her colonoscopy

## 2023-03-29 NOTE — Telephone Encounter (Signed)
Pt returned call from disconnected call

## 2023-03-30 ENCOUNTER — Other Ambulatory Visit: Payer: Self-pay | Admitting: *Deleted

## 2023-03-30 ENCOUNTER — Telehealth: Payer: Self-pay | Admitting: *Deleted

## 2023-03-30 DIAGNOSIS — Z86010 Personal history of colonic polyps: Secondary | ICD-10-CM

## 2023-03-30 MED ORDER — NA SULFATE-K SULFATE-MG SULF 17.5-3.13-1.6 GM/177ML PO SOLN: 1.0000 | Freq: Once | ORAL | 0 refills | Status: AC

## 2023-03-30 NOTE — Telephone Encounter (Signed)
Gastroenterology Pre-Procedure Review  Request Date: 05/15/2023 Requesting Physician: Dr. Servando Snare  PATIENT REVIEW QUESTIONS: The patient responded to the following health history questions as indicated:    1. Are you having any GI issues? no 2. Do you have a personal history of Polyps? yes (02/03/2020) 3. Do you have a family history of Colon Cancer or Polyps? no 4. Diabetes Mellitus? no 5. Joint replacements in the past 12 months?no 6. Major health problems in the past 3 months?no 7. Any artificial heart valves, MVP, or defibrillator?no    MEDICATIONS & ALLERGIES:    Patient reports the following regarding taking any anticoagulation/antiplatelet therapy:   Plavix, Coumadin, Eliquis, Xarelto, Lovenox, Pradaxa, Brilinta, or Effient? no Aspirin? yes (81 mg)  Patient confirms/reports the following medications:  Current Outpatient Medications  Medication Sig Dispense Refill   Accu-Chek Softclix Lancets lancets Use to check blood sugar daily for type 2 diabetes. E11.9 100 each 1   acetaminophen (TYLENOL) 650 MG CR tablet Take 1,300 mg by mouth every 8 (eight) hours as needed for pain.     albuterol (VENTOLIN HFA) 108 (90 Base) MCG/ACT inhaler Inhale 2 puffs into the lungs every morning. And PRN     aspirin 81 MG chewable tablet Chew 81 mg by mouth 3 (three) times a week.      AUGMENTIN 500-125 MG tablet Take 4 tablets by mouth once. 1 hour before a dental appointment.     diclofenac (VOLTAREN) 75 MG EC tablet TAKE 1 TABLET(75 MG) BY MOUTH TWICE DAILY 60 tablet 0   famotidine (PEPCID) 20 MG tablet TAKE 1 TABLET(20 MG) BY MOUTH AT BEDTIME 90 tablet 2   fluticasone (FLONASE) 50 MCG/ACT nasal spray Place 2 sprays into both nostrils at bedtime.     fluticasone (FLOVENT HFA) 110 MCG/ACT inhaler Inhale 2 puffs into the lungs 2 (two) times daily.     glucose blood (ACCU-CHEK AVIVA PLUS) test strip Use to check blood sugar once a day. DX: E11.9 100 each 1   losartan (COZAAR) 25 MG tablet TAKE 1  TABLET(25 MG) BY MOUTH DAILY 90 tablet 1   Misc. Devices (PULSE OXIMETER) MISC 1 Device by Does not apply route daily. 1 each 0   montelukast (SINGULAIR) 10 MG tablet TAKE 1 TABLET(10 MG) BY MOUTH DAILY WITH SUPPER 90 tablet 3   OVER THE COUNTER MEDICATION Take 3 tablets by mouth 3 (three) times daily. DoTerra Essential Oils supplements     OVER THE COUNTER MEDICATION Take 1 tablet by mouth with breakfast, with lunch, and with evening meal.     No current facility-administered medications for this visit.    Patient confirms/reports the following allergies:  Allergies  Allergen Reactions   Cefdinir Hives and Nausea Only    CAN TAKE AMOX     Rosuvastatin     Other reaction(s): Muscle Pain   Repatha [Evolocumab] Other (See Comments)    Not effective at lowering LDL (04/08/2019), caused myalgias   Tape Rash    Ok to use paper tape    No orders of the defined types were placed in this encounter.   AUTHORIZATION INFORMATION Primary Insurance: 1D#: Group #:  Secondary Insurance: 1D#: Group #:  SCHEDULE INFORMATION: Date: 05/15/2023 Time: Location: [facility]

## 2023-03-30 NOTE — Telephone Encounter (Signed)
Spoken to patient and we were able to schedule patient on 05/15/2023 with Dr Servando Snare at [facility]

## 2023-04-09 ENCOUNTER — Other Ambulatory Visit: Payer: Self-pay | Admitting: Internal Medicine

## 2023-04-09 DIAGNOSIS — M1990 Unspecified osteoarthritis, unspecified site: Secondary | ICD-10-CM

## 2023-04-10 ENCOUNTER — Other Ambulatory Visit: Payer: Self-pay | Admitting: Internal Medicine

## 2023-04-10 DIAGNOSIS — M1990 Unspecified osteoarthritis, unspecified site: Secondary | ICD-10-CM

## 2023-04-10 MED ORDER — DICLOFENAC SODIUM 75 MG PO TBEC
75.0000 mg | DELAYED_RELEASE_TABLET | Freq: Two times a day (BID) | ORAL | 1 refills | Status: DC
Start: 2023-04-10 — End: 2023-09-13

## 2023-04-11 NOTE — Telephone Encounter (Signed)
Duplicate request, medication was refilled 04/10/23.  Requested Prescriptions  Pending Prescriptions Disp Refills   diclofenac (VOLTAREN) 75 MG EC tablet [Pharmacy Med Name: DICLOFENAC SODIUM 75MG  DR TABLETS] 60 tablet 0    Sig: TAKE 1 TABLET(75 MG) BY MOUTH TWICE DAILY     Analgesics:  NSAIDS Failed - 04/09/2023  8:19 PM      Failed - Manual Review: Labs are only required if the patient has taken medication for more than 8 weeks.      Passed - Cr in normal range and within 360 days    Creat  Date Value Ref Range Status  03/16/2023 0.63 0.60 - 1.00 mg/dL Final   Creatinine, Urine  Date Value Ref Range Status  09/14/2022 27 20 - 275 mg/dL Final         Passed - HGB in normal range and within 360 days    Hemoglobin  Date Value Ref Range Status  03/16/2023 12.9 11.7 - 15.5 g/dL Final  86/57/8469 62.9 11.1 - 15.9 g/dL Final         Passed - PLT in normal range and within 360 days    Platelets  Date Value Ref Range Status  03/16/2023 372 140 - 400 Thousand/uL Final  10/27/2015 417 (H) 150 - 379 x10E3/uL Final         Passed - HCT in normal range and within 360 days    HCT  Date Value Ref Range Status  03/16/2023 39.1 35.0 - 45.0 % Final   Hematocrit  Date Value Ref Range Status  10/27/2015 39.3 34.0 - 46.6 % Final         Passed - eGFR is 30 or above and within 360 days    GFR, Est African American  Date Value Ref Range Status  05/19/2020 106 > OR = 60 mL/min/1.51m2 Final   GFR, Est Non African American  Date Value Ref Range Status  05/19/2020 91 > OR = 60 mL/min/1.56m2 Final   GFR, Estimated  Date Value Ref Range Status  01/21/2022 >60 >60 mL/min Final    Comment:    (NOTE) Calculated using the CKD-EPI Creatinine Equation (2021)    eGFR  Date Value Ref Range Status  03/16/2023 93 > OR = 60 mL/min/1.30m2 Final         Passed - Patient is not pregnant      Passed - Valid encounter within last 12 months    Recent Outpatient Visits           3 weeks ago  Encounter for general adult medical examination with abnormal findings   [facility] [place], [place] W, NP   6 months ago Controlled type 2 diabetes mellitus without complication, without long-term current use of insulin [facility])   [place] [facility] [place], Salvadore Oxford, NP   11 months ago COVID-19   [facility] [place], Salvadore Oxford, NP   1 year ago Encounter for general adult medical examination with abnormal findings   [facility] [place], [place], NP   1 year ago Controlled type 2 diabetes mellitus without complication, without long-term current use of insulin [facility])   [place] [facility] [facility] [place], Salvadore Oxford, NP       Future Appointments             In 5 months Baity, Salvadore Oxford, NP [facility] [place], [facility]

## 2023-05-08 ENCOUNTER — Encounter: Payer: Self-pay | Admitting: Gastroenterology

## 2023-05-15 ENCOUNTER — Other Ambulatory Visit: Payer: Self-pay

## 2023-05-15 ENCOUNTER — Ambulatory Visit: Payer: Medicare PPO | Admitting: Anesthesiology

## 2023-05-15 ENCOUNTER — Encounter: Payer: Self-pay | Admitting: Gastroenterology

## 2023-05-15 ENCOUNTER — Encounter
Admission: RE | Disposition: A | Discharge status: Home / Self Care | Payer: Self-pay | Source: Home / Self Care | Attending: Gastroenterology

## 2023-05-15 ENCOUNTER — Ambulatory Visit
Admission: RE | Admit: 2023-05-15 | Discharge: 2023-05-15 | Disposition: A | Discharge status: Home / Self Care | Payer: Medicare PPO | Attending: Gastroenterology | Admitting: Gastroenterology

## 2023-05-15 DIAGNOSIS — Z7951 Long term (current) use of inhaled steroids: Secondary | ICD-10-CM | POA: Diagnosis not present

## 2023-05-15 DIAGNOSIS — K5730 Diverticulosis of large intestine without perforation or abscess without bleeding: Secondary | ICD-10-CM | POA: Diagnosis not present

## 2023-05-15 DIAGNOSIS — E785 Hyperlipidemia, unspecified: Secondary | ICD-10-CM | POA: Insufficient documentation

## 2023-05-15 DIAGNOSIS — Z79899 Other long term (current) drug therapy: Secondary | ICD-10-CM | POA: Insufficient documentation

## 2023-05-15 DIAGNOSIS — Z1211 Encounter for screening for malignant neoplasm of colon: Secondary | ICD-10-CM | POA: Diagnosis not present

## 2023-05-15 DIAGNOSIS — Z833 Family history of diabetes mellitus: Secondary | ICD-10-CM | POA: Insufficient documentation

## 2023-05-15 DIAGNOSIS — K219 Gastro-esophageal reflux disease without esophagitis: Secondary | ICD-10-CM | POA: Insufficient documentation

## 2023-05-15 DIAGNOSIS — Z860100 Personal history of colon polyps, unspecified: Secondary | ICD-10-CM

## 2023-05-15 DIAGNOSIS — I700 Atherosclerosis of aorta: Secondary | ICD-10-CM | POA: Insufficient documentation

## 2023-05-15 DIAGNOSIS — J45909 Unspecified asthma, uncomplicated: Secondary | ICD-10-CM | POA: Diagnosis not present

## 2023-05-15 DIAGNOSIS — I10 Essential (primary) hypertension: Secondary | ICD-10-CM | POA: Insufficient documentation

## 2023-05-15 DIAGNOSIS — K635 Polyp of colon: Secondary | ICD-10-CM | POA: Diagnosis not present

## 2023-05-15 DIAGNOSIS — M1990 Unspecified osteoarthritis, unspecified site: Secondary | ICD-10-CM | POA: Insufficient documentation

## 2023-05-15 DIAGNOSIS — D125 Benign neoplasm of sigmoid colon: Secondary | ICD-10-CM | POA: Diagnosis not present

## 2023-05-15 DIAGNOSIS — D860 Sarcoidosis of lung: Secondary | ICD-10-CM | POA: Diagnosis not present

## 2023-05-15 DIAGNOSIS — Z860101 Personal history of adenomatous and serrated colon polyps: Secondary | ICD-10-CM | POA: Diagnosis not present

## 2023-05-15 DIAGNOSIS — E119 Type 2 diabetes mellitus without complications: Secondary | ICD-10-CM | POA: Insufficient documentation

## 2023-05-15 DIAGNOSIS — Z7952 Long term (current) use of systemic steroids: Secondary | ICD-10-CM | POA: Diagnosis not present

## 2023-05-15 DIAGNOSIS — R519 Headache, unspecified: Secondary | ICD-10-CM | POA: Diagnosis not present

## 2023-05-15 HISTORY — PX: POLYPECTOMY: SHX5525

## 2023-05-15 HISTORY — PX: COLONOSCOPY WITH PROPOFOL: SHX5780

## 2023-05-15 SURGERY — COLONOSCOPY WITH PROPOFOL
Anesthesia: General

## 2023-05-15 MED ORDER — LIDOCAINE HCL (CARDIAC) PF 100 MG/5ML IV SOSY
PREFILLED_SYRINGE | INTRAVENOUS | Status: DC | PRN
  Administered 2023-05-15: 50 mg via INTRAVENOUS

## 2023-05-15 MED ORDER — PROPOFOL 10 MG/ML IV BOLUS
INTRAVENOUS | Status: AC
Start: 2023-05-15 — End: ?
  Filled 2023-05-15: qty 20

## 2023-05-15 MED ORDER — STERILE WATER FOR IRRIGATION IR SOLN
Status: DC | PRN
  Administered 2023-05-15: 50 mL

## 2023-05-15 MED ORDER — PROPOFOL 10 MG/ML IV BOLUS
INTRAVENOUS | Status: DC | PRN
  Administered 2023-05-15: 80 mg via INTRAVENOUS
  Administered 2023-05-15: 30 mg via INTRAVENOUS
  Administered 2023-05-15: 50 mg via INTRAVENOUS
  Administered 2023-05-15: 30 mg via INTRAVENOUS
  Administered 2023-05-15 (×2): 50 mg via INTRAVENOUS
  Administered 2023-05-15 (×2): 30 mg via INTRAVENOUS

## 2023-05-15 MED ORDER — SODIUM CHLORIDE 0.9% FLUSH
10.0000 mL | INTRAVENOUS | Status: DC | PRN
  Administered 2023-05-15: 10 mL via INTRAVENOUS

## 2023-05-15 MED ORDER — SODIUM CHLORIDE 0.9 % IV SOLN: INTRAVENOUS | Status: DC

## 2023-05-15 MED ORDER — LACTATED RINGERS IV SOLN: INTRAVENOUS | Status: DC

## 2023-05-15 MED ORDER — LIDOCAINE HCL (PF) 2 % IJ SOLN
INTRAMUSCULAR | Status: AC
Start: 2023-05-15 — End: ?
  Filled 2023-05-15: qty 5

## 2023-05-15 MED ORDER — PROPOFOL 10 MG/ML IV BOLUS
INTRAVENOUS | Status: AC
  Filled 2023-05-15: qty 20

## 2023-05-15 SURGICAL SUPPLY — 7 items
FORCEPS BIOP RAD 4 LRG CAP 4 (CUTTING FORCEPS) IMPLANT
GOWN CVR UNV OPN BCK APRN NK (MISCELLANEOUS) ×4 IMPLANT
GOWN ISOL THUMB LOOP REG UNIV (MISCELLANEOUS) ×4
KIT PRC NS LF DISP ENDO (KITS) ×2 IMPLANT
KIT PROCEDURE OLYMPUS (KITS) ×2
MANIFOLD NEPTUNE II (INSTRUMENTS) ×2 IMPLANT
WATER STERILE IRR 250ML POUR (IV SOLUTION) ×2 IMPLANT

## 2023-05-15 NOTE — Op Note (Signed)
[facility] Patient Name: Mary Burke Procedure Date: 05/15/2023 9:35 AM MRN: [id number] Account #: [id number] Date of Birth: 06-Jul-1946 Admit Type: Outpatient Age: 77 Room: [facility] OR ROOM 01 Gender: Female Note Status: Finalized Instrument Name: [id number] Procedure:             Colonoscopy Indications:           High risk colon cancer surveillance: Personal history                         of colonic polyps Providers:             Midge Minium MD, MD Referring MD:          Lorre Munroe (Referring MD) Medicines:             Propofol per Anesthesia Complications:         No immediate complications. Procedure:             Pre-Anesthesia Assessment:                        - Prior to the procedure, a History and Physical was                         performed, and patient medications and allergies were                         reviewed. The patient's tolerance of previous                         anesthesia was also reviewed. The risks and benefits                         of the procedure and the sedation options and risks                         were discussed with the patient. All questions were                         answered, and informed consent was obtained. Prior                         Anticoagulants: The patient has taken no anticoagulant                         or antiplatelet agents. ASA Grade Assessment: II - A                         patient with mild systemic disease. After reviewing                         the risks and benefits, the patient was deemed in                         satisfactory condition to undergo the procedure.                        After obtaining informed consent, the colonoscope was  passed under direct vision. Throughout the procedure,                         the patient's blood pressure, pulse, and oxygen                         saturations were monitored continuously. The                          Colonoscope was introduced through the anus and                         advanced to the the cecum, identified by appendiceal                         orifice and ileocecal valve. The colonoscopy was                         performed without difficulty. The patient tolerated                         the procedure well. The quality of the bowel                         preparation was excellent. Findings:      The perianal and digital rectal examinations were normal.      A 3 mm polyp was found in the sigmoid colon. The polyp was sessile. The       polyp was removed with a cold biopsy forceps. Resection and retrieval       were complete.      Multiple small-mouthed diverticula were found in the sigmoid colon. Impression:            - One 3 mm polyp in the sigmoid colon, removed with a                         cold biopsy forceps. Resected and retrieved.                        - Diverticulosis in the sigmoid colon. Recommendation:        - Discharge patient to home.                        - Resume previous diet.                        - Continue present medications.                        - Await pathology results. Procedure Code(s):     --- Professional ---                        [id number], Colonoscopy, flexible; with biopsy, single or                         multiple Diagnosis Code(s):     --- Professional ---                        Z86.010, Personal history  of colonic polyps                        D12.5, Benign neoplasm of sigmoid colon CPT copyright 2022 American Medical Association. All rights reserved. The codes documented in this report are preliminary and upon coder review may  be revised to meet current compliance requirements. Midge Minium MD, MD 05/15/2023 10:03:29 AM This report has been signed electronically. Number of Addenda: 0 Note Initiated On: 05/15/2023 9:35 AM Scope Withdrawal Time: 0 hours 7 minutes 37 seconds  Total Procedure Duration: 0 hours 18 minutes 24 seconds   Estimated Blood Loss:  Estimated blood loss: none.      [facility]

## 2023-05-15 NOTE — Plan of Care (Signed)
 [facility] Tonsillectomy/Adenoidectomy, Postoperative PEDS care plan entered in error.
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
## 2023-05-15 NOTE — Transfer of Care (Signed)
Immediate Anesthesia Transfer of Care Note  Patient: Mary Burke  Procedure(s) Performed: COLONOSCOPY WITH PROPOFOL POLYPECTOMY  Patient Location: PACU  Anesthesia Type: General  Level of Consciousness: awake, alert  and patient cooperative  Airway and Oxygen Therapy: Patient Spontanous Breathing and Patient connected to supplemental oxygen  Post-op Assessment: Post-op Vital signs reviewed, Patient's Cardiovascular Status Stable, Respiratory Function Stable, Patent Airway and No signs of Nausea or vomiting  Post-op Vital Signs: Reviewed and stable  Complications: No notable events documented.
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
## 2023-05-15 NOTE — Anesthesia Postprocedure Evaluation (Signed)
Anesthesia Post Note  Patient: Mary Burke  Procedure(s) Performed: COLONOSCOPY WITH PROPOFOL POLYPECTOMY  Patient location during evaluation: PACU Anesthesia Type: General Level of consciousness: awake and alert Pain management: pain level controlled Vital Signs Assessment: post-procedure vital signs reviewed and stable Respiratory status: spontaneous breathing, nonlabored ventilation, respiratory function stable and patient connected to nasal cannula oxygen Cardiovascular status: blood pressure returned to baseline and stable Postop Assessment: no apparent nausea or vomiting Anesthetic complications: no   No notable events documented.   Last Vitals:  Vitals:   05/15/23 1015 05/15/23 1018  BP: 118/67 120/68  Pulse: 89 91  Resp: 16 15  Temp:    SpO2: 96% 100%    Last Pain:  Vitals:   05/15/23 1006  TempSrc: Temporal  PainSc:                  Marisue Humble

## 2023-05-16 ENCOUNTER — Encounter: Payer: Self-pay | Admitting: Gastroenterology

## 2023-05-17 LAB — SURGICAL PATHOLOGY

## 2023-05-18 ENCOUNTER — Encounter: Payer: Self-pay | Admitting: Gastroenterology

## 2023-05-19 DIAGNOSIS — E66811 Obesity, class 1: Secondary | ICD-10-CM | POA: Diagnosis not present

## 2023-05-19 DIAGNOSIS — E782 Mixed hyperlipidemia: Secondary | ICD-10-CM | POA: Diagnosis not present

## 2023-05-19 DIAGNOSIS — E119 Type 2 diabetes mellitus without complications: Secondary | ICD-10-CM | POA: Diagnosis not present

## 2023-05-19 DIAGNOSIS — I700 Atherosclerosis of aorta: Secondary | ICD-10-CM | POA: Diagnosis not present

## 2023-05-19 DIAGNOSIS — K219 Gastro-esophageal reflux disease without esophagitis: Secondary | ICD-10-CM | POA: Diagnosis not present

## 2023-05-19 DIAGNOSIS — D860 Sarcoidosis of lung: Secondary | ICD-10-CM | POA: Diagnosis not present

## 2023-05-19 DIAGNOSIS — I10 Essential (primary) hypertension: Secondary | ICD-10-CM | POA: Diagnosis not present

## 2023-05-19 DIAGNOSIS — J4520 Mild intermittent asthma, uncomplicated: Secondary | ICD-10-CM | POA: Diagnosis not present

## 2023-06-01 ENCOUNTER — Other Ambulatory Visit: Payer: Self-pay | Admitting: Internal Medicine

## 2023-06-01 DIAGNOSIS — K219 Gastro-esophageal reflux disease without esophagitis: Secondary | ICD-10-CM

## 2023-06-01 NOTE — Telephone Encounter (Signed)
Requested Prescriptions  Pending Prescriptions Disp Refills   famotidine (PEPCID) 20 MG tablet [Pharmacy Med Name: FAMOTIDINE 20MG  TABLETS] 90 tablet 3    Sig: TAKE 1 TABLET(20 MG) BY MOUTH AT BEDTIME     Gastroenterology:  H2 Antagonists Passed - 06/01/2023  7:03 AM      Passed - Valid encounter within last 12 months    Recent Outpatient Visits           2 months ago Encounter for general adult medical examination with abnormal findings   [facility] [place], [place] W, NP   8 months ago Controlled type 2 diabetes mellitus without complication, without long-term current use of insulin [facility])   [place] [facility] [place], Salvadore Oxford, NP   1 year ago COVID-19   [facility] [place], Salvadore Oxford, NP   1 year ago Encounter for general adult medical examination with abnormal findings   [facility] [place], Salvadore Oxford, NP   2 years ago Controlled type 2 diabetes mellitus without complication, without long-term current use of insulin [place])   [facility] [place], Salvadore Oxford, NP       Future Appointments             In 3 months Baity, Salvadore Oxford, NP [facility]

## 2023-06-20 DIAGNOSIS — H43813 Vitreous degeneration, bilateral: Secondary | ICD-10-CM | POA: Diagnosis not present

## 2023-06-20 DIAGNOSIS — E119 Type 2 diabetes mellitus without complications: Secondary | ICD-10-CM | POA: Diagnosis not present

## 2023-06-20 DIAGNOSIS — H2513 Age-related nuclear cataract, bilateral: Secondary | ICD-10-CM | POA: Diagnosis not present

## 2023-06-20 LAB — HM DIABETES EYE EXAM

## 2023-06-26 ENCOUNTER — Ambulatory Visit
Admission: RE | Admit: 2023-06-26 | Discharge: 2023-06-26 | Disposition: A | Discharge status: Home / Self Care | Payer: Medicare PPO | Source: Ambulatory Visit | Attending: Internal Medicine | Admitting: Internal Medicine

## 2023-06-26 DIAGNOSIS — Z780 Asymptomatic menopausal state: Secondary | ICD-10-CM

## 2023-06-26 DIAGNOSIS — Z1231 Encounter for screening mammogram for malignant neoplasm of breast: Secondary | ICD-10-CM | POA: Diagnosis not present

## 2023-06-26 DIAGNOSIS — M85851 Other specified disorders of bone density and structure, right thigh: Secondary | ICD-10-CM | POA: Diagnosis not present

## 2023-07-06 ENCOUNTER — Other Ambulatory Visit: Payer: Self-pay | Admitting: Internal Medicine

## 2023-07-06 DIAGNOSIS — I10 Essential (primary) hypertension: Secondary | ICD-10-CM

## 2023-07-06 NOTE — Telephone Encounter (Signed)
Requested Prescriptions  Pending Prescriptions Disp Refills   losartan (COZAAR) 25 MG tablet [Pharmacy Med Name: LOSARTAN 25MG  TABLETS] 90 tablet 1    Sig: TAKE 1 TABLET(25 MG) BY MOUTH DAILY     Cardiovascular:  Angiotensin Receptor Blockers Passed - 07/06/2023 12:11 PM      Passed - Cr in normal range and within 180 days    Creat  Date Value Ref Range Status  03/16/2023 0.63 0.60 - 1.00 mg/dL Final   Creatinine, Urine  Date Value Ref Range Status  09/14/2022 27 20 - 275 mg/dL Final         Passed - K in normal range and within 180 days    Potassium  Date Value Ref Range Status  03/16/2023 4.4 3.5 - 5.3 mmol/L Final         Passed - Patient is not pregnant      Passed - Last BP in normal range    BP Readings from Last 1 Encounters:  05/15/23 120/68         Passed - Valid encounter within last 6 months    Recent Outpatient Visits           3 months ago Encounter for general adult medical examination with abnormal findings   [facility] [place], [place], NP   9 months ago Controlled type 2 diabetes mellitus without complication, without long-term current use of insulin [facility])   [place] [facility] [place], Salvadore Oxford, NP   1 year ago COVID-19   [facility] [place], Salvadore Oxford, NP   1 year ago Encounter for general adult medical examination with abnormal findings   [place] [facility] [place], [place], NP   2 years ago Controlled type 2 diabetes mellitus without complication, without long-term current use of insulin [facility])   [facility] [place], Salvadore Oxford, NP       Future Appointments             In 2 months Baity, Salvadore Oxford, NP [place] [facility]

## 2023-08-24 DIAGNOSIS — J4520 Mild intermittent asthma, uncomplicated: Secondary | ICD-10-CM | POA: Diagnosis not present

## 2023-08-24 DIAGNOSIS — D869 Sarcoidosis, unspecified: Secondary | ICD-10-CM | POA: Diagnosis not present

## 2023-08-24 DIAGNOSIS — J301 Allergic rhinitis due to pollen: Secondary | ICD-10-CM | POA: Diagnosis not present

## 2023-09-13 ENCOUNTER — Encounter: Payer: Self-pay | Admitting: Internal Medicine

## 2023-09-13 ENCOUNTER — Ambulatory Visit: Payer: Medicare PPO | Admitting: Internal Medicine

## 2023-09-13 VITALS — BP 114/68 | Ht 62.0 in | Wt 159.2 lb

## 2023-09-13 DIAGNOSIS — I700 Atherosclerosis of aorta: Secondary | ICD-10-CM | POA: Diagnosis not present

## 2023-09-13 DIAGNOSIS — M791 Myalgia, unspecified site: Secondary | ICD-10-CM

## 2023-09-13 DIAGNOSIS — E785 Hyperlipidemia, unspecified: Secondary | ICD-10-CM

## 2023-09-13 DIAGNOSIS — K219 Gastro-esophageal reflux disease without esophagitis: Secondary | ICD-10-CM

## 2023-09-13 DIAGNOSIS — Z780 Asymptomatic menopausal state: Secondary | ICD-10-CM

## 2023-09-13 DIAGNOSIS — Z6829 Body mass index (BMI) 29.0-29.9, adult: Secondary | ICD-10-CM

## 2023-09-13 DIAGNOSIS — E119 Type 2 diabetes mellitus without complications: Secondary | ICD-10-CM

## 2023-09-13 DIAGNOSIS — M8580 Other specified disorders of bone density and structure, unspecified site: Secondary | ICD-10-CM | POA: Diagnosis not present

## 2023-09-13 DIAGNOSIS — E1169 Type 2 diabetes mellitus with other specified complication: Secondary | ICD-10-CM

## 2023-09-13 DIAGNOSIS — E663 Overweight: Secondary | ICD-10-CM | POA: Diagnosis not present

## 2023-09-13 DIAGNOSIS — M1990 Unspecified osteoarthritis, unspecified site: Secondary | ICD-10-CM | POA: Diagnosis not present

## 2023-09-13 DIAGNOSIS — I10 Essential (primary) hypertension: Secondary | ICD-10-CM | POA: Diagnosis not present

## 2023-09-13 DIAGNOSIS — T466X5A Adverse effect of antihyperlipidemic and antiarteriosclerotic drugs, initial encounter: Secondary | ICD-10-CM

## 2023-09-13 DIAGNOSIS — D860 Sarcoidosis of lung: Secondary | ICD-10-CM | POA: Diagnosis not present

## 2023-09-13 MED ORDER — DICLOFENAC SODIUM 75 MG PO TBEC: 75.0000 mg | DELAYED_RELEASE_TABLET | Freq: Every day | ORAL | 1 refills | Status: DC

## 2023-09-13 NOTE — Assessment & Plan Note (Signed)
 POCT A1c 6.5% We will check urine microalbumin today Encourage low-carb diet and exercise for weight loss Encourage routine eye exam Encouraged routine foot exam Flu shot UTD Pneumovax and Prevnar 20 UTD COVID UTD

## 2023-09-13 NOTE — Assessment & Plan Note (Signed)
 Encouraged diet and exercise for weight loss ?

## 2023-09-13 NOTE — Assessment & Plan Note (Signed)
 Controlled on losartan Reinforced DASH diet and exercise for weight loss CMET today

## 2023-09-13 NOTE — Assessment & Plan Note (Signed)
 Cmet and lipid profile today Statin intolerant  Encouraged her to consume a low fat diet

## 2023-09-13 NOTE — Assessment & Plan Note (Signed)
 Cmet and lipid profile today

## 2023-09-13 NOTE — Assessment & Plan Note (Signed)
Encourage daily weightbearing exercise Continue calcium and vitamin D 

## 2023-09-13 NOTE — Assessment & Plan Note (Signed)
 Continue singulair, arnuity and albuterol

## 2023-09-13 NOTE — Assessment & Plan Note (Signed)
Encourage weight loss as this can help reduce joint pain Continue diclofenac as needed 

## 2023-09-13 NOTE — Patient Instructions (Signed)

## 2023-09-13 NOTE — Assessment & Plan Note (Signed)
Avoid foods that trigger reflux Encourage weight loss as this can help reduce reflux symptoms Continue famotidine 

## 2023-09-13 NOTE — Assessment & Plan Note (Signed)
 CMET and lipid profile today Encouraged her to consume a low fat diet Statin intolerant  Continue aspirin
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
## 2023-09-14 ENCOUNTER — Encounter: Payer: Self-pay | Admitting: Internal Medicine

## 2023-09-14 LAB — COMPLETE METABOLIC PANEL WITH GFR
AG Ratio: 1.8 (calc) (ref 1.0–2.5)
ALT: 17 U/L (ref 6–29)
AST: 20 U/L (ref 10–35)
Albumin: 4.2 g/dL (ref 3.6–5.1)
Alkaline phosphatase (APISO): 67 U/L (ref 37–153)
BUN: 22 mg/dL (ref 7–25)
CO2: 30 mmol/L (ref 20–32)
Calcium: 9.8 mg/dL (ref 8.6–10.4)
Chloride: 103 mmol/L (ref 98–110)
Creat: 0.67 mg/dL (ref 0.60–1.00)
Globulin: 2.4 g/dL (ref 1.9–3.7)
Glucose, Bld: 106 mg/dL — ABNORMAL HIGH (ref 65–99)
Potassium: 4.2 mmol/L (ref 3.5–5.3)
Sodium: 139 mmol/L (ref 135–146)
Total Bilirubin: 0.4 mg/dL (ref 0.2–1.2)
Total Protein: 6.6 g/dL (ref 6.1–8.1)
eGFR: 91 mL/min/{1.73_m2} (ref >=60)

## 2023-09-14 LAB — MICROALBUMIN / CREATININE URINE RATIO
Creatinine, Urine: 68 mg/dL (ref 20–275)
Microalb Creat Ratio: 7 mg/g{creat} (ref <30)
Microalb, Ur: 0.5 mg/dL

## 2023-09-14 LAB — LIPID PANEL
Cholesterol: 267 mg/dL — ABNORMAL HIGH (ref <200)
HDL: 53 mg/dL (ref >=50)
LDL Cholesterol (Calc): 178 mg/dL — ABNORMAL HIGH
Non-HDL Cholesterol (Calc): 214 mg/dL — ABNORMAL HIGH (ref <130)
Total CHOL/HDL Ratio: 5 (calc) — ABNORMAL HIGH (ref <5.0)
Triglycerides: 200 mg/dL — ABNORMAL HIGH (ref <150)

## 2023-09-14 LAB — CBC
HCT: 41.7 % (ref 35.0–45.0)
Hemoglobin: 13.4 g/dL (ref 11.7–15.5)
MCH: 29.6 pg (ref 27.0–33.0)
MCHC: 32.1 g/dL (ref 32.0–36.0)
MCV: 92.3 fL (ref 80.0–100.0)
MPV: 9.8 fL (ref 7.5–12.5)
Platelets: 422 10*3/uL — ABNORMAL HIGH (ref 140–400)
RBC: 4.52 10*6/uL (ref 3.80–5.10)
RDW: 13.6 % (ref 11.0–15.0)
WBC: 6.7 10*3/uL (ref 3.8–10.8)

## 2023-09-14 LAB — HEMOGLOBIN A1C
Hgb A1c MFr Bld: 6.1 %{Hb} — ABNORMAL HIGH (ref <5.7)
Mean Plasma Glucose: 128 mg/dL
eAG (mmol/L): 7.1 mmol/L

## 2023-10-03 ENCOUNTER — Other Ambulatory Visit: Payer: Self-pay | Admitting: Internal Medicine

## 2023-10-03 DIAGNOSIS — I10 Essential (primary) hypertension: Secondary | ICD-10-CM

## 2023-10-04 NOTE — Telephone Encounter (Signed)
 Requested Prescriptions  Pending Prescriptions Disp Refills   losartan (COZAAR) 25 MG tablet [Pharmacy Med Name: LOSARTAN 25MG  TABLETS] 30 tablet 0    Sig: TAKE 1 TABLET(25 MG) BY MOUTH DAILY     Cardiovascular:  Angiotensin Receptor Blockers Failed - 10/04/2023  2:15 PM      Failed - Valid encounter within last 6 months    Recent Outpatient Visits           6 months ago Encounter for general adult medical examination with abnormal findings   [place] [facility] [place], Salvadore [place], NP   1 year ago Controlled type 2 diabetes mellitus without complication, without long-term current use of insulin [facility])   [place] [facility] [place], [place], NP   1 year ago COVID-19   [facility] [place], [place], NP   1 year ago Encounter for general adult medical examination with abnormal findings   [facility] [place], Salvadore Oxford, NP   2 years ago Controlled type 2 diabetes mellitus without complication, without long-term current use of insulin [facility])    [facility] [place], [place] W, NP              Passed - Cr in normal range and within 180 days    Creat  Date Value Ref Range Status  09/13/2023 0.67 0.60 - 1.00 mg/dL Final   Creatinine, Urine  Date Value Ref Range Status  09/13/2023 68 20 - 275 mg/dL Final         Passed - K in normal range and within 180 days    Potassium  Date Value Ref Range Status  09/13/2023 4.2 3.5 - 5.3 mmol/L Final         Passed - Patient is not pregnant      Passed - Last BP in normal range    BP Readings from Last 1 Encounters:  09/13/23 114/68

## 2023-10-26 ENCOUNTER — Ambulatory Visit: Payer: Medicare PPO

## 2023-10-26 VITALS — Ht 62.0 in | Wt 159.0 lb

## 2023-10-26 DIAGNOSIS — Z0000 Encounter for general adult medical examination without abnormal findings: Secondary | ICD-10-CM

## 2023-10-26 NOTE — Patient Instructions (Addendum)
 Mary Burke , Thank you for taking time to come for your Medicare Wellness Visit. I appreciate your ongoing commitment to your health goals. Please review the following plan we discussed and let me know if I can assist you in the future.   Referrals/Orders/Follow-Ups/Clinician Recommendations: Follow up with Dtap/Tdap Vaccine  This is a list of the screening recommended for you and due dates:  Health Maintenance  Topic Date Due   DTaP/Tdap/Td vaccine (2 - Td or Tdap) 02/19/2023   Complete foot exam   09/15/2023   Flu Shot  02/16/2024   Hemoglobin A1C  03/12/2024   Eye exam for diabetics  06/19/2024   Yearly kidney function blood test for diabetes  09/12/2024   Yearly kidney health urinalysis for diabetes  09/12/2024   Medicare Annual Wellness Visit  10/25/2024   Colon Cancer Screening  05/14/2026   Pneumonia Vaccine  Completed   DEXA scan (bone density measurement)  Completed   Hepatitis C Screening  Completed   Zoster (Shingles) Vaccine  Completed   HPV Vaccine  Aged Out   Meningitis B Vaccine  Aged Out   COVID-19 Vaccine  Discontinued    Advanced directives: (Copy Requested) Please bring a copy of your health care power of attorney and living will to the office to be added to your chart at your convenience. You can mail to [facility] [address] [place], [place] [address] or email to [email or web address]  Next Medicare Annual Wellness Visit scheduled for next year: Yes
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
## 2023-10-30 ENCOUNTER — Other Ambulatory Visit: Payer: Self-pay | Admitting: Internal Medicine

## 2023-10-30 DIAGNOSIS — I10 Essential (primary) hypertension: Secondary | ICD-10-CM

## 2023-10-31 NOTE — Telephone Encounter (Signed)
 Requested Prescriptions  Pending Prescriptions Disp Refills   losartan (COZAAR) 25 MG tablet [Pharmacy Med Name: LOSARTAN 25MG  TABLETS] 90 tablet 0    Sig: TAKE 1 TABLET(25 MG) BY MOUTH DAILY     Cardiovascular:  Angiotensin Receptor Blockers Passed - 10/31/2023 11:57 AM      Passed - Cr in normal range and within 180 days    Creat  Date Value Ref Range Status  09/13/2023 0.67 0.60 - 1.00 mg/dL Final   Creatinine, Urine  Date Value Ref Range Status  09/13/2023 68 20 - 275 mg/dL Final         Passed - K in normal range and within 180 days    Potassium  Date Value Ref Range Status  09/13/2023 4.2 3.5 - 5.3 mmol/L Final         Passed - Patient is not pregnant      Passed - Last BP in normal range    BP Readings from Last 1 Encounters:  09/13/23 114/68         Passed - Valid encounter within last 6 months    Recent Outpatient Visits           1 month ago Controlled type 2 diabetes mellitus without complication, without long-term current use of insulin [facility])   [facility] [place], [place], [place]

## 2023-11-16 ENCOUNTER — Encounter: Payer: Self-pay | Admitting: Internal Medicine

## 2023-12-21 ENCOUNTER — Other Ambulatory Visit: Payer: Self-pay

## 2023-12-21 DIAGNOSIS — I10 Essential (primary) hypertension: Secondary | ICD-10-CM

## 2023-12-21 DIAGNOSIS — M1990 Unspecified osteoarthritis, unspecified site: Secondary | ICD-10-CM

## 2023-12-21 DIAGNOSIS — K219 Gastro-esophageal reflux disease without esophagitis: Secondary | ICD-10-CM

## 2023-12-21 DIAGNOSIS — J302 Other seasonal allergic rhinitis: Secondary | ICD-10-CM

## 2023-12-21 MED ORDER — FLUTICASONE PROPIONATE 50 MCG/ACT NA SUSP: 2.0000 | Freq: Every evening | NASAL | 2 refills | Status: DC

## 2023-12-21 MED ORDER — LOSARTAN POTASSIUM 25 MG PO TABS: 25.0000 mg | ORAL_TABLET | Freq: Every day | ORAL | 0 refills | Status: DC

## 2023-12-21 MED ORDER — MONTELUKAST SODIUM 10 MG PO TABS: 10.0000 mg | ORAL_TABLET | Freq: Every day | ORAL | 3 refills | Status: AC

## 2023-12-21 MED ORDER — DICLOFENAC SODIUM 75 MG PO TBEC: 75.0000 mg | DELAYED_RELEASE_TABLET | Freq: Every day | ORAL | 1 refills | Status: DC

## 2023-12-21 MED ORDER — FAMOTIDINE 20 MG PO TABS: 20.0000 mg | ORAL_TABLET | Freq: Every day | ORAL | 3 refills | Status: AC

## 2024-01-23 ENCOUNTER — Other Ambulatory Visit: Payer: Self-pay | Admitting: Internal Medicine

## 2024-01-23 DIAGNOSIS — I10 Essential (primary) hypertension: Secondary | ICD-10-CM

## 2024-01-23 DIAGNOSIS — M1990 Unspecified osteoarthritis, unspecified site: Secondary | ICD-10-CM

## 2024-01-25 NOTE — Telephone Encounter (Signed)
 Change of pharmacy  Requested Prescriptions  Pending Prescriptions Disp Refills   losartan  (COZAAR ) 25 MG tablet [Pharmacy Med Name: LOSARTAN  25MG  TABLETS] 90 tablet 0    Sig: TAKE 1 TABLET(25 MG) BY MOUTH DAILY     Cardiovascular:  Angiotensin Receptor Blockers Passed - 01/25/2024 11:50 AM      Passed - Cr in normal range and within 180 days    Creat  Date Value Ref Range Status  09/13/2023 0.67 0.60 - 1.00 mg/dL Final   Creatinine, Urine  Date Value Ref Range Status  09/13/2023 68 20 - 275 mg/dL Final         Passed - K in normal range and within 180 days    Potassium  Date Value Ref Range Status  09/13/2023 4.2 3.5 - 5.3 mmol/L Final         Passed - Patient is not pregnant      Passed - Last BP in normal range    BP Readings from Last 1 Encounters:  09/13/23 114/68         Passed - Valid encounter within last 6 months    Recent Outpatient Visits           4 months ago Controlled type 2 diabetes mellitus without complication, without long-term current use of insulin [facility])   [place] [facility] [place], [place] W, NP               diclofenac  (VOLTAREN ) 75 MG EC tablet [Pharmacy Med Name: DICLOFENAC  SODIUM 75MG  DR TABLETS] 90 tablet 0    Sig: TAKE 1 TABLET(75 MG) BY MOUTH DAILY     Analgesics:  NSAIDS Failed - 01/25/2024 11:50 AM      Failed - Manual Review: Labs are only required if the patient has taken medication for more than 8 weeks.      Failed - PLT in normal range and within 360 days    Platelets  Date Value Ref Range Status  09/13/2023 422 (H) 140 - 400 Thousand/uL Final  10/27/2015 417 (H) 150 - 379 x10E3/uL Final         Passed - Cr in normal range and within 360 days    Creat  Date Value Ref Range Status  09/13/2023 0.67 0.60 - 1.00 mg/dL Final   Creatinine, Urine  Date Value Ref Range Status  09/13/2023 68 20 - 275 mg/dL Final         Passed - HGB in normal range and within 360 days    Hemoglobin  Date Value Ref Range  Status  09/13/2023 13.4 11.7 - 15.5 g/dL Final  95/88/7982 86.4 11.1 - 15.9 g/dL Final         Passed - HCT in normal range and within 360 days    HCT  Date Value Ref Range Status  09/13/2023 41.7 35.0 - 45.0 % Final   Hematocrit  Date Value Ref Range Status  10/27/2015 39.3 34.0 - 46.6 % Final         Passed - eGFR is 30 or above and within 360 days    GFR, Est African American  Date Value Ref Range Status  05/19/2020 106 > OR = 60 mL/min/1.53m2 Final   GFR, Est Non African American  Date Value Ref Range Status  05/19/2020 91 > OR = 60 mL/min/1.46m2 Final   GFR, Estimated  Date Value Ref Range Status  01/21/2022 >60 >60 mL/min Final    Comment:    (NOTE) Calculated using  the CKD-EPI Creatinine Equation (2021)    eGFR  Date Value Ref Range Status  09/13/2023 91 > OR = 60 mL/min/1.60m2 Final         Passed - Patient is not pregnant      Passed - Valid encounter within last 12 months    Recent Outpatient Visits           4 months ago Controlled type 2 diabetes mellitus without complication, without long-term current use of insulin [facility])   [facility] [place], [place], [place]

## 2024-02-28 DIAGNOSIS — J4520 Mild intermittent asthma, uncomplicated: Secondary | ICD-10-CM | POA: Diagnosis not present

## 2024-02-28 DIAGNOSIS — D860 Sarcoidosis of lung: Secondary | ICD-10-CM | POA: Diagnosis not present

## 2024-03-01 ENCOUNTER — Telehealth: Payer: Self-pay | Admitting: Internal Medicine

## 2024-03-01 DIAGNOSIS — M1990 Unspecified osteoarthritis, unspecified site: Secondary | ICD-10-CM

## 2024-03-01 MED ORDER — DICLOFENAC SODIUM 75 MG PO TBEC: 75.0000 mg | DELAYED_RELEASE_TABLET | Freq: Two times a day (BID) | ORAL | 0 refills | Status: DC

## 2024-03-01 NOTE — Telephone Encounter (Signed)
 Diclofenac  75 mg twice daily sent to pharmacy.

## 2024-03-01 NOTE — Telephone Encounter (Signed)
 Copied from CRM #[id number]. Topic: Clinical - Prescription Issue >> Mar 01, 2024  9:51 AM Deleta RAMAN wrote: Reason for CRM: diclofenac  (VOLTAREN ) 75 MG EC tablet patient would like pcp to admin prescription due to dosage change from 2 to 1 pill daily. 1 tablet is not as helpful as two tablets and the patient would like to change the quantity of usage. The [facility] the [facility] use [facility] please contact patient for information regarding pharmacy was not able to verify address

## 2024-03-01 NOTE — Addendum Note (Signed)
 Addended by: ANTONETTE ANGELINE ORN on: 03/01/2024 12:32 PM   Modules accepted: Orders

## 2024-03-03 ENCOUNTER — Other Ambulatory Visit: Payer: Self-pay | Admitting: Internal Medicine

## 2024-03-05 NOTE — Telephone Encounter (Signed)
 Requested Prescriptions  Pending Prescriptions Disp Refills   fluticasone  (FLONASE ) 50 MCG/ACT nasal spray [Pharmacy Med Name: FLUTICASONE  PROPIONATE 50 MCG/ACT Nasal Suspension] 16 g 2    Sig: USE 2 SPRAYS IN BOTH NOSTRILS AT BEDTIME.     Ear, Nose, and Throat: Nasal Preparations - Corticosteroids Passed - 03/05/2024  4:49 PM      Passed - Valid encounter within last 12 months    Recent Outpatient Visits           5 months ago Controlled type 2 diabetes mellitus without complication, without long-term current use of insulin [facility])   [facility] [place], [place], [place]

## 2024-03-06 DIAGNOSIS — D860 Sarcoidosis of lung: Secondary | ICD-10-CM | POA: Diagnosis not present

## 2024-03-06 DIAGNOSIS — J302 Other seasonal allergic rhinitis: Secondary | ICD-10-CM | POA: Diagnosis not present

## 2024-03-06 DIAGNOSIS — J4520 Mild intermittent asthma, uncomplicated: Secondary | ICD-10-CM | POA: Diagnosis not present

## 2024-03-08 NOTE — Telephone Encounter (Signed)
 Kasa, Kurian, MD to Delbra Ferrari C     02/17/17  9:16 AM Patient contacted this morning her ENB biopsy results show inflammatory cells however patient will need further tissue samples by receiving a CT-guided biopsy by radiology and I have explained this to the patient and is scheduled for 02/24/17

## 2024-03-20 ENCOUNTER — Ambulatory Visit (INDEPENDENT_AMBULATORY_CARE_PROVIDER_SITE_OTHER): Payer: Medicare PPO | Admitting: Internal Medicine

## 2024-03-20 ENCOUNTER — Encounter: Payer: Self-pay | Admitting: Internal Medicine

## 2024-03-20 VITALS — BP 110/68 | Ht 62.0 in | Wt 159.0 lb

## 2024-03-20 DIAGNOSIS — E663 Overweight: Secondary | ICD-10-CM

## 2024-03-20 DIAGNOSIS — Z6829 Body mass index (BMI) 29.0-29.9, adult: Secondary | ICD-10-CM | POA: Diagnosis not present

## 2024-03-20 DIAGNOSIS — E119 Type 2 diabetes mellitus without complications: Secondary | ICD-10-CM | POA: Diagnosis not present

## 2024-03-20 DIAGNOSIS — Z1231 Encounter for screening mammogram for malignant neoplasm of breast: Secondary | ICD-10-CM

## 2024-03-20 DIAGNOSIS — Z0001 Encounter for general adult medical examination with abnormal findings: Secondary | ICD-10-CM

## 2024-03-20 MED ORDER — DICLOFENAC SODIUM 75 MG PO TBEC: 75.0000 mg | DELAYED_RELEASE_TABLET | Freq: Two times a day (BID) | ORAL | 1 refills | Status: AC

## 2024-03-20 NOTE — Assessment & Plan Note (Signed)
 Encouraged diet and exercise for weight loss ?

## 2024-03-20 NOTE — Patient Instructions (Signed)
 Health Maintenance for Postmenopausal Women Menopause is a normal process in which your ability to get pregnant comes to an end. This process happens slowly over many months or years, usually between the ages of 76 and 38. Menopause is complete when you have missed your menstrual period for 12 months. It is important to talk with your health care provider about some of the most common conditions that affect women after menopause (postmenopausal women). These include heart disease, cancer, and bone loss (osteoporosis). Adopting a healthy lifestyle and getting preventive care can help to promote your health and wellness. The actions you take can also lower your chances of developing some of these common conditions. What are the signs and symptoms of menopause? During menopause, you may have the following symptoms: Hot flashes. These can be moderate or severe. Night sweats. Decrease in sex drive. Mood swings. Headaches. Tiredness (fatigue). Irritability. Memory problems. Problems falling asleep or staying asleep. Talk with your health care provider about treatment options for your symptoms. Do I need hormone replacement therapy? Hormone replacement therapy is effective in treating symptoms that are caused by menopause, such as hot flashes and night sweats. Hormone replacement carries certain risks, especially as you become older. If you are thinking about using estrogen or estrogen with progestin, discuss the benefits and risks with your health care provider. How can I reduce my risk for heart disease and stroke? The risk of heart disease, heart attack, and stroke increases as you age. One of the causes may be a change in the body's hormones during menopause. This can affect how your body uses dietary fats, triglycerides, and cholesterol. Heart attack and stroke are medical emergencies. There are many things that you can do to help prevent heart disease and stroke. Watch your blood pressure High  blood pressure causes heart disease and increases the risk of stroke. This is more likely to develop in people who have high blood pressure readings or are overweight. Have your blood pressure checked: Every 3-5 years if you are 32-23 years of age. Every year if you are 31 years old or older. Eat a healthy diet  Eat a diet that includes plenty of vegetables, fruits, low-fat dairy products, and lean protein. Do not eat a lot of foods that are high in solid fats, added sugars, or sodium. Get regular exercise Get regular exercise. This is one of the most important things you can do for your health. Most adults should: Try to exercise for at least 150 minutes each week. The exercise should increase your heart rate and make you sweat (moderate-intensity exercise). Try to do strengthening exercises at least twice each week. Do these in addition to the moderate-intensity exercise. Spend less time sitting. Even light physical activity can be beneficial. Other tips Work with your health care provider to achieve or maintain a healthy weight. Do not use any products that contain nicotine or tobacco. These products include cigarettes, chewing tobacco, and vaping devices, such as e-cigarettes. If you need help quitting, ask your health care provider. Know your numbers. Ask your health care provider to check your cholesterol and your blood sugar (glucose). Continue to have your blood tested as directed by your health care provider. Do I need screening for cancer? Depending on your health history and family history, you may need to have cancer screenings at different stages of your life. This may include screening for: Breast cancer. Cervical cancer. Lung cancer. Colorectal cancer. What is my risk for osteoporosis? After menopause, you may be  at increased risk for osteoporosis. Osteoporosis is a condition in which bone destruction happens more quickly than new bone creation. To help prevent osteoporosis or  the bone fractures that can happen because of osteoporosis, you may take the following actions: If you are 24-54 years old, get at least 1,000 mg of calcium and at least 600 international units (IU) of vitamin D  per day. If you are older than age 75 but younger than age 30, get at least 1,200 mg of calcium and at least 600 international units (IU) of vitamin D  per day. If you are older than age 8, get at least 1,200 mg of calcium and at least 800 international units (IU) of vitamin D  per day. Smoking and drinking excessive alcohol increase the risk of osteoporosis. Eat foods that are rich in calcium and vitamin D , and do weight-bearing exercises several times each week as directed by your health care provider. How does menopause affect my mental health? Depression may occur at any age, but it is more common as you become older. Common symptoms of depression include: Feeling depressed. Changes in sleep patterns. Changes in appetite or eating patterns. Feeling an overall lack of motivation or enjoyment of activities that you previously enjoyed. Frequent crying spells. Talk with your health care provider if you think that you are experiencing any of these symptoms. General instructions See your health care provider for regular wellness exams and vaccines. This may include: Scheduling regular health, dental, and eye exams. Getting and maintaining your vaccines. These include: Influenza vaccine. Get this vaccine each year before the flu season begins. Pneumonia vaccine. Shingles vaccine. Tetanus, diphtheria, and pertussis (Tdap) booster vaccine. Your health care provider may also recommend other immunizations. Tell your health care provider if you have ever been abused or do not feel safe at home. Summary Menopause is a normal process in which your ability to get pregnant comes to an end. This condition causes hot flashes, night sweats, decreased interest in sex, mood swings, headaches, or lack  of sleep. Treatment for this condition may include hormone replacement therapy. Take actions to keep yourself healthy, including exercising regularly, eating a healthy diet, watching your weight, and checking your blood pressure and blood sugar levels. Get screened for cancer and depression. Make sure that you are up to date with all your vaccines. This information is not intended to replace advice given to you by your health care provider. Make sure you discuss any questions you have with your health care provider. Document Revised: 11/23/2020 Document Reviewed: 11/23/2020 Elsevier Patient Education  2024 [employer].
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
## 2024-03-21 ENCOUNTER — Ambulatory Visit: Payer: Self-pay | Admitting: Internal Medicine

## 2024-03-21 LAB — COMPREHENSIVE METABOLIC PANEL WITH GFR
AG Ratio: 2 (calc) (ref 1.0–2.5)
ALT: 13 U/L (ref 6–29)
AST: 18 U/L (ref 10–35)
Albumin: 4.1 g/dL (ref 3.6–5.1)
Alkaline phosphatase (APISO): 61 U/L (ref 37–153)
BUN: 14 mg/dL (ref 7–25)
CO2: 27 mmol/L (ref 20–32)
Calcium: 9.4 mg/dL (ref 8.6–10.4)
Chloride: 104 mmol/L (ref 98–110)
Creat: 0.67 mg/dL (ref 0.60–1.00)
Globulin: 2.1 g/dL (ref 1.9–3.7)
Glucose, Bld: 93 mg/dL (ref 65–99)
Potassium: 5.2 mmol/L (ref 3.5–5.3)
Sodium: 140 mmol/L (ref 135–146)
Total Bilirubin: 0.3 mg/dL (ref 0.2–1.2)
Total Protein: 6.2 g/dL (ref 6.1–8.1)
eGFR: 91 mL/min/1.73m2 (ref >=60)

## 2024-03-21 LAB — CBC
HCT: 42.4 % (ref 35.0–45.0)
Hemoglobin: 13.5 g/dL (ref 11.7–15.5)
MCH: 30.1 pg (ref 27.0–33.0)
MCHC: 31.8 g/dL — ABNORMAL LOW (ref 32.0–36.0)
MCV: 94.6 fL (ref 80.0–100.0)
MPV: 9.7 fL (ref 7.5–12.5)
Platelets: 468 Thousand/uL — ABNORMAL HIGH (ref 140–400)
RBC: 4.48 Million/uL (ref 3.80–5.10)
RDW: 13.1 % (ref 11.0–15.0)
WBC: 8.6 Thousand/uL (ref 3.8–10.8)

## 2024-03-21 LAB — LIPID PANEL
Cholesterol: 210 mg/dL — ABNORMAL HIGH (ref <200)
HDL: 47 mg/dL — ABNORMAL LOW (ref >=50)
LDL Cholesterol (Calc): 126 mg/dL — ABNORMAL HIGH
Non-HDL Cholesterol (Calc): 163 mg/dL — ABNORMAL HIGH (ref <130)
Total CHOL/HDL Ratio: 4.5 (calc) (ref <5.0)
Triglycerides: 229 mg/dL — ABNORMAL HIGH (ref <150)

## 2024-03-21 LAB — HEMOGLOBIN A1C
Hgb A1c MFr Bld: 6.2 % — ABNORMAL HIGH (ref <5.7)
Mean Plasma Glucose: 131 mg/dL
eAG (mmol/L): 7.3 mmol/L

## 2024-04-01 ENCOUNTER — Other Ambulatory Visit: Payer: Self-pay | Admitting: Internal Medicine

## 2024-04-01 DIAGNOSIS — I10 Essential (primary) hypertension: Secondary | ICD-10-CM

## 2024-04-02 NOTE — Telephone Encounter (Signed)
 Requested Prescriptions  Pending Prescriptions Disp Refills   losartan  (COZAAR ) 25 MG tablet [Pharmacy Med Name: LOSARTAN  POTASSIUM 25 MG Oral Tablet] 90 tablet 1    Sig: TAKE 1 TABLET EVERY DAY     Cardiovascular:  Angiotensin Receptor Blockers Passed - 04/02/2024 12:32 PM      Passed - Cr in normal range and within 180 days    Creat  Date Value Ref Range Status  03/20/2024 0.67 0.60 - 1.00 mg/dL Final   Creatinine, Urine  Date Value Ref Range Status  09/13/2023 68 20 - 275 mg/dL Final         Passed - K in normal range and within 180 days    Potassium  Date Value Ref Range Status  03/20/2024 5.2 3.5 - 5.3 mmol/L Final         Passed - Patient is not pregnant      Passed - Last BP in normal range    BP Readings from Last 1 Encounters:  03/20/24 110/68         Passed - Valid encounter within last 6 months    Recent Outpatient Visits           1 week ago Encounter for general adult medical examination with abnormal findings   [facility] [place], [place] W, NP   6 months ago Controlled type 2 diabetes mellitus without complication, without long-term current use of insulin [facility])   [facility] [place], [place], [place]

## 2024-05-17 ENCOUNTER — Other Ambulatory Visit: Payer: Self-pay | Admitting: Internal Medicine

## 2024-05-18 NOTE — Telephone Encounter (Signed)
 Requested Prescriptions  Pending Prescriptions Disp Refills   fluticasone  (FLONASE ) 50 MCG/ACT nasal spray [Pharmacy Med Name: FLUTICASONE  PROPIONATE 50 MCG/ACT Nasal Suspension] 48 g 3    Sig: USE 2 SPRAYS IN BOTH NOSTRILS AT BEDTIME.     Ear, Nose, and Throat: Nasal Preparations - Corticosteroids Passed - 05/18/2024  9:06 AM      Passed - Valid encounter within last 12 months    Recent Outpatient Visits           1 month ago Encounter for general adult medical examination with abnormal findings   [facility] [place], [place] W, NP   8 months ago Controlled type 2 diabetes mellitus without complication, without long-term current use of insulin [facility])   [facility] [place], [place], [place]

## 2024-05-23 DIAGNOSIS — E782 Mixed hyperlipidemia: Secondary | ICD-10-CM | POA: Diagnosis not present

## 2024-05-23 DIAGNOSIS — D860 Sarcoidosis of lung: Secondary | ICD-10-CM | POA: Diagnosis not present

## 2024-05-23 DIAGNOSIS — I700 Atherosclerosis of aorta: Secondary | ICD-10-CM | POA: Diagnosis not present

## 2024-05-23 DIAGNOSIS — I10 Essential (primary) hypertension: Secondary | ICD-10-CM | POA: Diagnosis not present

## 2024-05-23 DIAGNOSIS — J4520 Mild intermittent asthma, uncomplicated: Secondary | ICD-10-CM | POA: Diagnosis not present

## 2024-05-23 DIAGNOSIS — E66811 Obesity, class 1: Secondary | ICD-10-CM | POA: Diagnosis not present

## 2024-05-23 DIAGNOSIS — K219 Gastro-esophageal reflux disease without esophagitis: Secondary | ICD-10-CM | POA: Diagnosis not present

## 2024-05-23 DIAGNOSIS — E119 Type 2 diabetes mellitus without complications: Secondary | ICD-10-CM | POA: Diagnosis not present

## 2024-05-28 ENCOUNTER — Ambulatory Visit
Admission: EM | Admit: 2024-05-28 | Discharge: 2024-05-28 | Disposition: A | Discharge status: Home / Self Care | Attending: Emergency Medicine | Admitting: Emergency Medicine

## 2024-05-28 ENCOUNTER — Ambulatory Visit (INDEPENDENT_AMBULATORY_CARE_PROVIDER_SITE_OTHER)

## 2024-05-28 DIAGNOSIS — D860 Sarcoidosis of lung: Secondary | ICD-10-CM | POA: Diagnosis not present

## 2024-05-28 DIAGNOSIS — J189 Pneumonia, unspecified organism: Secondary | ICD-10-CM

## 2024-05-28 DIAGNOSIS — J0100 Acute maxillary sinusitis, unspecified: Secondary | ICD-10-CM

## 2024-05-28 DIAGNOSIS — K449 Diaphragmatic hernia without obstruction or gangrene: Secondary | ICD-10-CM | POA: Diagnosis not present

## 2024-05-28 DIAGNOSIS — Z96611 Presence of right artificial shoulder joint: Secondary | ICD-10-CM | POA: Diagnosis not present

## 2024-05-28 DIAGNOSIS — J45909 Unspecified asthma, uncomplicated: Secondary | ICD-10-CM | POA: Diagnosis not present

## 2024-05-28 LAB — POC COVID19/FLU A&B COMBO
Covid Antigen, POC: NEGATIVE
Influenza A Antigen, POC: NEGATIVE
Influenza B Antigen, POC: NEGATIVE

## 2024-05-28 MED ORDER — ALBUTEROL SULFATE HFA 108 (90 BASE) MCG/ACT IN AERS: 2.0000 | INHALATION_SPRAY | RESPIRATORY_TRACT | 0 refills | Status: AC | PRN

## 2024-05-28 MED ORDER — AEROCHAMBER MV MISC: 1 refills | Status: AC

## 2024-05-28 MED ORDER — ALBUTEROL SULFATE (2.5 MG/3ML) 0.083% IN NEBU
2.5000 mg | INHALATION_SOLUTION | Freq: Once | RESPIRATORY_TRACT | Status: AC
  Administered 2024-05-28: 2.5 mg via RESPIRATORY_TRACT

## 2024-05-28 MED ORDER — IPRATROPIUM-ALBUTEROL 0.5-2.5 (3) MG/3ML IN SOLN
3.0000 mL | Freq: Once | RESPIRATORY_TRACT | Status: AC
  Administered 2024-05-28: 3 mL via RESPIRATORY_TRACT

## 2024-05-28 MED ORDER — LEVOFLOXACIN 750 MG PO TABS: 750.0000 mg | ORAL_TABLET | Freq: Every day | ORAL | 0 refills | Status: AC

## 2024-05-28 MED ORDER — PREDNISONE 50 MG PO TABS
60.0000 mg | ORAL_TABLET | Freq: Once | ORAL | Status: AC
  Administered 2024-05-28: 60 mg via ORAL

## 2024-05-28 MED ORDER — PREDNISONE 50 MG PO TABS: 50.0000 mg | ORAL_TABLET | Freq: Every day | ORAL | 0 refills | Status: AC
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
## 2024-05-28 NOTE — ED Triage Notes (Signed)
 Pt states that she went to [facility] 5 days ago and is now having SOB and congestion  Pt c/o SOB and Congestion x2days  Pt has had her covid injection on 11.1.25 and her flu shot on 10.28.25

## 2024-05-28 NOTE — Discharge Instructions (Signed)
 Start Mucinex , saline nasal irrigation with a NeilMed sinus rinse and distilled water  as often as you want and Flonase  for your sinus infection.  Stop the other cold medicine.  Your x-ray is concerning for pneumonia.  We will contact you if the radiology overread differs enough from mine and we need to change management.  This is for sinus infection and for pneumonia.  Take two puffs from your albuterol  inhaler with your spacer every 4 hours for 2 days, then every 6 hours for 2 days, then as needed. You can back off if you start to improve  sooner. Finish the steroids unless your doctor tells you to stop. Finish the antibiotics, even if you feel better.  You may start the steroids tomorrow, as you have already taken today's dose. Make sure you drink extra fluids. Return to the ER if you get worse, have a fever >100.4, or any other concerns.   If the spacer is too expensive at the pharmacy, you can get an AeroChamber Z-Stat off of [employer] for about $10-$15.  Go to www.goodrx.com  or www.costplusdrugs.com to look up your medications. This will give you a list of where you can find your prescriptions at the most affordable prices. Or ask the pharmacist what the cash price is, or if they have any other discount programs available to help make your medication more affordable. This can be less expensive than what you would pay with insurance.

## 2024-05-31 ENCOUNTER — Ambulatory Visit (HOSPITAL_COMMUNITY): Payer: Self-pay

## 2024-06-04 DIAGNOSIS — R0609 Other forms of dyspnea: Secondary | ICD-10-CM | POA: Diagnosis not present

## 2024-06-05 ENCOUNTER — Other Ambulatory Visit: Payer: Self-pay | Admitting: Internal Medicine

## 2024-06-05 DIAGNOSIS — I10 Essential (primary) hypertension: Secondary | ICD-10-CM

## 2024-06-05 DIAGNOSIS — D869 Sarcoidosis, unspecified: Secondary | ICD-10-CM

## 2024-06-05 DIAGNOSIS — K219 Gastro-esophageal reflux disease without esophagitis: Secondary | ICD-10-CM

## 2024-06-06 DIAGNOSIS — J4521 Mild intermittent asthma with (acute) exacerbation: Secondary | ICD-10-CM | POA: Diagnosis not present

## 2024-06-07 NOTE — Telephone Encounter (Signed)
 Requested Prescriptions  Refused Prescriptions Disp Refills   famotidine  (PEPCID ) 20 MG tablet [Pharmacy Med Name: FAMOTIDINE  20MG  TABLETS] 90 tablet 3    Sig: TAKE 1 TABLET(20 MG) BY MOUTH AT BEDTIME     Gastroenterology:  H2 Antagonists Passed - 06/07/2024  1:04 PM      Passed - Valid encounter within last 12 months    Recent Outpatient Visits           2 months ago Encounter for general adult medical examination with abnormal findings   [facility] [place], [place] W, NP   8 months ago Controlled type 2 diabetes mellitus without complication, without long-term current use of insulin [facility])   [facility] [place], [place], [place]

## 2024-06-17 DIAGNOSIS — J4520 Mild intermittent asthma, uncomplicated: Secondary | ICD-10-CM | POA: Diagnosis not present

## 2024-06-17 DIAGNOSIS — J302 Other seasonal allergic rhinitis: Secondary | ICD-10-CM | POA: Diagnosis not present

## 2024-06-17 DIAGNOSIS — D860 Sarcoidosis of lung: Secondary | ICD-10-CM | POA: Diagnosis not present

## 2024-06-20 ENCOUNTER — Other Ambulatory Visit (HOSPITAL_COMMUNITY): Payer: Self-pay | Admitting: Internal Medicine

## 2024-06-20 ENCOUNTER — Other Ambulatory Visit: Payer: Self-pay | Admitting: Cardiology

## 2024-06-20 DIAGNOSIS — H2513 Age-related nuclear cataract, bilateral: Secondary | ICD-10-CM | POA: Diagnosis not present

## 2024-06-20 DIAGNOSIS — D860 Sarcoidosis of lung: Secondary | ICD-10-CM

## 2024-06-20 DIAGNOSIS — E119 Type 2 diabetes mellitus without complications: Secondary | ICD-10-CM | POA: Diagnosis not present

## 2024-06-20 DIAGNOSIS — H538 Other visual disturbances: Secondary | ICD-10-CM | POA: Diagnosis not present

## 2024-06-20 DIAGNOSIS — H43813 Vitreous degeneration, bilateral: Secondary | ICD-10-CM | POA: Diagnosis not present

## 2024-06-20 LAB — OPHTHALMOLOGY REPORT-SCANNED

## 2024-06-20 NOTE — Progress Notes (Signed)
 ERROR

## 2024-06-21 ENCOUNTER — Other Ambulatory Visit (HOSPITAL_COMMUNITY): Payer: Self-pay | Admitting: Internal Medicine

## 2024-06-21 DIAGNOSIS — D860 Sarcoidosis of lung: Secondary | ICD-10-CM

## 2024-06-26 ENCOUNTER — Ambulatory Visit
Admission: RE | Admit: 2024-06-26 | Discharge: 2024-06-26 | Disposition: A | Discharge status: Home / Self Care | Source: Ambulatory Visit | Attending: Internal Medicine | Admitting: Internal Medicine

## 2024-06-26 DIAGNOSIS — Z1231 Encounter for screening mammogram for malignant neoplasm of breast: Secondary | ICD-10-CM | POA: Diagnosis present

## 2024-06-27 ENCOUNTER — Ambulatory Visit

## 2024-07-03 ENCOUNTER — Encounter (HOSPITAL_COMMUNITY)

## 2024-07-03 ENCOUNTER — Other Ambulatory Visit (HOSPITAL_COMMUNITY)

## 2024-07-19 ENCOUNTER — Encounter: Payer: Self-pay | Admitting: Ophthalmology

## 2024-07-23 NOTE — Discharge Instructions (Signed)

## 2024-07-24 ENCOUNTER — Ambulatory Visit: Payer: Self-pay | Admitting: General Practice

## 2024-07-24 ENCOUNTER — Other Ambulatory Visit: Payer: Self-pay

## 2024-07-24 ENCOUNTER — Ambulatory Visit
Admission: RE | Admit: 2024-07-24 | Discharge: 2024-07-24 | Disposition: A | Discharge status: Home / Self Care | Attending: Ophthalmology | Admitting: Ophthalmology

## 2024-07-24 ENCOUNTER — Encounter: Payer: Self-pay | Admitting: Ophthalmology

## 2024-07-24 ENCOUNTER — Encounter: Admission: RE | Disposition: A | Payer: Self-pay | Source: Home / Self Care | Attending: Ophthalmology

## 2024-07-24 ENCOUNTER — Encounter: Payer: Self-pay | Admitting: General Practice

## 2024-07-24 DIAGNOSIS — E1136 Type 2 diabetes mellitus with diabetic cataract: Secondary | ICD-10-CM | POA: Insufficient documentation

## 2024-07-24 DIAGNOSIS — R0602 Shortness of breath: Secondary | ICD-10-CM | POA: Insufficient documentation

## 2024-07-24 DIAGNOSIS — I10 Essential (primary) hypertension: Secondary | ICD-10-CM | POA: Diagnosis not present

## 2024-07-24 DIAGNOSIS — Z7982 Long term (current) use of aspirin: Secondary | ICD-10-CM | POA: Diagnosis not present

## 2024-07-24 DIAGNOSIS — Z79899 Other long term (current) drug therapy: Secondary | ICD-10-CM | POA: Diagnosis not present

## 2024-07-24 DIAGNOSIS — J45909 Unspecified asthma, uncomplicated: Secondary | ICD-10-CM | POA: Insufficient documentation

## 2024-07-24 DIAGNOSIS — H2511 Age-related nuclear cataract, right eye: Secondary | ICD-10-CM | POA: Insufficient documentation

## 2024-07-24 DIAGNOSIS — Z7951 Long term (current) use of inhaled steroids: Secondary | ICD-10-CM | POA: Insufficient documentation

## 2024-07-24 DIAGNOSIS — K219 Gastro-esophageal reflux disease without esophagitis: Secondary | ICD-10-CM | POA: Insufficient documentation

## 2024-07-24 DIAGNOSIS — E119 Type 2 diabetes mellitus without complications: Secondary | ICD-10-CM

## 2024-07-24 DIAGNOSIS — R519 Headache, unspecified: Secondary | ICD-10-CM | POA: Diagnosis not present

## 2024-07-24 HISTORY — DX: Dyspnea, unspecified: R06.00

## 2024-07-24 HISTORY — PX: CATARACT EXTRACTION W/PHACO: SHX586

## 2024-07-24 SURGERY — PHACOEMULSIFICATION, CATARACT, WITH IOL INSERTION
Anesthesia: Monitor Anesthesia Care | Site: Eye | Laterality: Right

## 2024-07-24 MED ORDER — LIDOCAINE HCL (PF) 2 % IJ SOLN
INTRAOCULAR | Status: DC | PRN
  Administered 2024-07-24: 2 mL

## 2024-07-24 MED ORDER — SIGHTPATH DOSE#1 BSS IO SOLN
INTRAOCULAR | Status: DC | PRN
  Administered 2024-07-24: 15 mL via INTRAOCULAR

## 2024-07-24 MED ORDER — PHENYLEPHRINE HCL 10 % OP SOLN
1.0000 [drp] | OPHTHALMIC | Status: AC | PRN
  Administered 2024-07-24 (×3): 1 [drp] via OPHTHALMIC

## 2024-07-24 MED ORDER — MOXIFLOXACIN HCL 0.5 % OP SOLN
OPHTHALMIC | Status: DC | PRN
  Administered 2024-07-24: .2 mL via OPHTHALMIC

## 2024-07-24 MED ORDER — CYCLOPENTOLATE HCL 2 % OP SOLN
1.0000 [drp] | OPHTHALMIC | Status: AC | PRN
  Administered 2024-07-24 (×3): 1 [drp] via OPHTHALMIC

## 2024-07-24 MED ORDER — SIGHTPATH DOSE#1 NA HYALUR & NA CHOND-NA HYALUR IO KIT
PACK | INTRAOCULAR | Status: DC | PRN
  Administered 2024-07-24: 1 via OPHTHALMIC

## 2024-07-24 MED ORDER — FENTANYL CITRATE (PF) 100 MCG/2ML IJ SOLN
INTRAMUSCULAR | Status: DC | PRN
  Administered 2024-07-24: 50 ug via INTRAVENOUS

## 2024-07-24 MED ORDER — SIGHTPATH DOSE#1 BSS IO SOLN
INTRAOCULAR | Status: DC | PRN
  Administered 2024-07-24: 51 mL via OPHTHALMIC

## 2024-07-24 MED ORDER — MIDAZOLAM HCL (PF) 2 MG/2ML IJ SOLN
INTRAMUSCULAR | Status: DC | PRN
  Administered 2024-07-24: 1 mg via INTRAVENOUS

## 2024-07-24 MED ORDER — TETRACAINE HCL 0.5 % OP SOLN
1.0000 [drp] | OPHTHALMIC | Status: DC | PRN
  Administered 2024-07-24 (×3): 1 [drp] via OPHTHALMIC

## 2024-07-24 MED ORDER — MIDAZOLAM HCL 2 MG/2ML IJ SOLN
INTRAMUSCULAR | Status: AC
  Filled 2024-07-24: qty 2

## 2024-07-24 MED ORDER — FENTANYL CITRATE (PF) 100 MCG/2ML IJ SOLN
INTRAMUSCULAR | Status: AC
  Filled 2024-07-24: qty 2

## 2024-07-24 MED ORDER — BRIMONIDINE TARTRATE-TIMOLOL 0.2-0.5 % OP SOLN
OPHTHALMIC | Status: DC | PRN
  Administered 2024-07-24: 1 [drp] via OPHTHALMIC

## 2024-07-24 SURGICAL SUPPLY — 7 items
FEE CATARACT SUITE SIGHTPATH (MISCELLANEOUS) ×1 IMPLANT
GLOVE BIOGEL PI IND STRL 8 (GLOVE) ×1 IMPLANT
GLOVE SURG LX STRL 7.5 STRW (GLOVE) ×1 IMPLANT
GLOVE SURG SYN 6.5 PF PI BL (GLOVE) ×1 IMPLANT
LENS IOL PANO PRO TORC 3 17.0 IMPLANT
NEEDLE FILTER BLUNT 18X1 1/2 (NEEDLE) ×1 IMPLANT
SYR 3ML LL SCALE MARK (SYRINGE) ×1 IMPLANT

## 2024-07-24 NOTE — Addendum Note (Signed)
 Addendum  created 07/24/24 1228 by Lavon Bothwell K, MD   Review and Sign - Ready for Procedure

## 2024-07-24 NOTE — Anesthesia Postprocedure Evaluation (Signed)
"   Anesthesia Post Note  Patient: Mary Burke  Procedure(s) Performed: PHACOEMULSIFICATION, CATARACT, WITH IOL INSERTION 2.30 00:16.8 (Right: Eye)  Patient location during evaluation: PACU Anesthesia Type: MAC Level of consciousness: awake and alert Pain management: pain level controlled Vital Signs Assessment: post-procedure vital signs reviewed and stable Respiratory status: spontaneous breathing, nonlabored ventilation, respiratory function stable and patient connected to nasal cannula oxygen Cardiovascular status: stable and blood pressure returned to baseline Postop Assessment: no apparent nausea or vomiting Anesthetic complications: no   No notable events documented.   Last Vitals:  Vitals:   07/24/24 0743  BP: (!) 144/73  Resp: 13  Temp: 36.8 C  SpO2: 96%    Last Pain:  Vitals:   07/24/24 0743  TempSrc: Temporal  PainSc: 0-No pain                 Redell MARLA Breaker      "

## 2024-07-24 NOTE — Op Note (Signed)
 LOCATION:  [facility]   PREOPERATIVE DIAGNOSIS:  Nuclear sclerotic cataract of the right eye.  H25.11   POSTOPERATIVE DIAGNOSIS:  Nuclear sclerotic cataract of the right eye.   PROCEDURE:  Phacoemulsification with Toric posterior chamber intraocular lens placement of the right eye.  Ultrasound time: Procedures: PHACOEMULSIFICATION, CATARACT, WITH IOL INSERTION 2.30 00:16.8 (Right)  LENS:   Implant Name Type Inv. Item Serial No. Manufacturer Lot No. LRB No. Used Action  LENS IOL PANO PRO TORC 3 17.0 - [id number]  LENS IOL PANO PRO TORC 3 17.0 [id number] [employer]  Right 1 Implanted     [id number] Toric intraocular lens with 1.5 diopters of cylindrical power with axis orientation at 106 degrees.   SURGEON:  Dene FABIENE Etienne, MD   ANESTHESIA: Topical with tetracaine  drops and 2% Xylocaine  jelly, augmented with 1% preservative-free intracameral lidocaine . .   COMPLICATIONS:  None.   DESCRIPTION OF PROCEDURE:  The patient was identified in the holding room and transported to the operating suite and placed in the supine position under the operating microscope.  The right eye was identified as the operative eye, and it was prepped and draped in the usual sterile ophthalmic fashion.    A clear-corneal paracentesis incision was made at the 12:00 position.  0.5 ml of preservative-free 1% lidocaine  was injected into the anterior chamber. The anterior chamber was filled with Viscoat.  A 2.4 millimeter near clear corneal incision was then made at the 9:00 position.  A cystotome and capsulorrhexis forceps were then used to make a curvilinear capsulorrhexis.  Hydrodissection and hydrodelineation were then performed using balanced salt solution.   Phacoemulsification was then used in stop and chop fashion to remove the lens, nucleus and epinucleus.  The remaining cortex was aspirated using the irrigation and aspiration handpiece.  Provisc viscoelastic was then placed into the capsular  bag to distend it for lens placement.  The Verion digital marker was used to align the implant at the intended axis.   A Toric lens was then injected into the capsular bag.  It was rotated clockwise until the axis marks on the lens were approximately 15 degrees in the counterclockwise direction to the intended alignment.  The viscoelastic was aspirated from the eye using the irrigation aspiration handpiece.  Then, a Koch spatula through the sideport incision was used to rotate the lens in a clockwise direction until the axis markings of the intraocular lens were lined up with the Verion alignment.  Balanced salt solution was then used to hydrate the wounds. Vigamox  0.2 ml of a 1mg  per ml solution was injected into the anterior chamber for a dose of 0.2 mg of intracameral antibiotic at the completion of the case.    The eye was noted to have a physiologic pressure and there was no wound leak noted.   Timolol  and Brimonidine  drops were applied to the eye.  The patient was taken to the recovery room in stable condition having had no complications of anesthesia or surgery.  Maze Corniel 07/24/2024, 8:46 AM

## 2024-07-24 NOTE — Anesthesia Preprocedure Evaluation (Addendum)
"                                    Anesthesia Evaluation    Airway Mallampati: III  TM Distance: >3 FB Neck ROM: Full    Dental no notable dental hx.    Pulmonary shortness of breath, asthma , pneumonia   Pulmonary exam normal breath sounds clear to auscultation       Cardiovascular hypertension, Normal cardiovascular exam Rhythm:Regular Rate:Normal     Neuro/Psych  Headaches    GI/Hepatic ,GERD  ,,  Endo/Other  diabetes    Renal/GU      Musculoskeletal   Abdominal   Peds  Hematology   Anesthesia Other Findings   Reproductive/Obstetrics                              Anesthesia Physical Anesthesia Plan  ASA: 3  Anesthesia Plan: MAC   Post-op Pain Management: Minimal or no pain anticipated   Induction: Intravenous  PONV Risk Score and Plan: 0  Airway Management Planned:   Additional Equipment:   Intra-op Plan:   Post-operative Plan:   Informed Consent: I have reviewed the patients History and Physical, chart, labs and discussed the procedure including the risks, benefits and alternatives for the proposed anesthesia with the patient or authorized representative who has indicated his/her understanding and acceptance.       Plan Discussed with: CRNA  Anesthesia Plan Comments:         Anesthesia Quick Evaluation  "

## 2024-07-24 NOTE — Transfer of Care (Signed)
 Immediate Anesthesia Transfer of Care Note  Patient: Mary Burke  Procedure(s) Performed: PHACOEMULSIFICATION, CATARACT, WITH IOL INSERTION 2.30 00:16.8 (Right: Eye)  Patient Location: PACU  Anesthesia Type: MAC  Level of Consciousness: awake, alert  and patient cooperative  Airway and Oxygen Therapy: Patient Spontanous Breathing   Post-op Assessment: Post-op Vital signs reviewed, Patient's Cardiovascular Status Stable, Respiratory Function Stable, Patent Airway and No signs of Nausea or vomiting  Post-op Vital Signs: Reviewed and stable  Complications: No notable events documented.
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
## 2024-07-26 ENCOUNTER — Other Ambulatory Visit (HOSPITAL_COMMUNITY): Payer: Self-pay | Admitting: Internal Medicine

## 2024-07-26 DIAGNOSIS — D860 Sarcoidosis of lung: Secondary | ICD-10-CM

## 2024-07-31 ENCOUNTER — Encounter: Payer: Self-pay | Admitting: Ophthalmology

## 2024-08-05 NOTE — Discharge Instructions (Signed)

## 2024-08-07 ENCOUNTER — Ambulatory Visit: Payer: Self-pay | Admitting: Anesthesiology

## 2024-08-07 ENCOUNTER — Ambulatory Visit
Admission: RE | Admit: 2024-08-07 | Discharge: 2024-08-07 | Disposition: A | Discharge status: Home / Self Care | Attending: Ophthalmology | Admitting: Ophthalmology

## 2024-08-07 ENCOUNTER — Encounter: Payer: Self-pay | Admitting: Ophthalmology

## 2024-08-07 ENCOUNTER — Other Ambulatory Visit: Payer: Self-pay

## 2024-08-07 ENCOUNTER — Encounter: Admission: RE | Disposition: A | Payer: Self-pay | Source: Home / Self Care | Attending: Ophthalmology

## 2024-08-07 DIAGNOSIS — E1136 Type 2 diabetes mellitus with diabetic cataract: Secondary | ICD-10-CM | POA: Insufficient documentation

## 2024-08-07 DIAGNOSIS — J45909 Unspecified asthma, uncomplicated: Secondary | ICD-10-CM | POA: Diagnosis not present

## 2024-08-07 DIAGNOSIS — Z833 Family history of diabetes mellitus: Secondary | ICD-10-CM | POA: Insufficient documentation

## 2024-08-07 DIAGNOSIS — E119 Type 2 diabetes mellitus without complications: Secondary | ICD-10-CM

## 2024-08-07 DIAGNOSIS — H2512 Age-related nuclear cataract, left eye: Secondary | ICD-10-CM | POA: Diagnosis present

## 2024-08-07 DIAGNOSIS — Z961 Presence of intraocular lens: Secondary | ICD-10-CM | POA: Diagnosis not present

## 2024-08-07 DIAGNOSIS — Z9841 Cataract extraction status, right eye: Secondary | ICD-10-CM | POA: Insufficient documentation

## 2024-08-07 DIAGNOSIS — I10 Essential (primary) hypertension: Secondary | ICD-10-CM | POA: Insufficient documentation

## 2024-08-07 DIAGNOSIS — Z7982 Long term (current) use of aspirin: Secondary | ICD-10-CM | POA: Insufficient documentation

## 2024-08-07 DIAGNOSIS — Z79899 Other long term (current) drug therapy: Secondary | ICD-10-CM | POA: Insufficient documentation

## 2024-08-07 HISTORY — PX: CATARACT EXTRACTION W/PHACO: SHX586

## 2024-08-07 MED ORDER — SIGHTPATH DOSE#1 NA HYALUR & NA CHOND-NA HYALUR IO KIT
PACK | INTRAOCULAR | Status: DC | PRN
  Administered 2024-08-07: 1 via OPHTHALMIC

## 2024-08-07 MED ORDER — PHENYLEPHRINE HCL 10 % OP SOLN
1.0000 [drp] | OPHTHALMIC | Status: AC | PRN
  Administered 2024-08-07 (×3): 1 [drp] via OPHTHALMIC

## 2024-08-07 MED ORDER — MOXIFLOXACIN HCL 0.5 % OP SOLN
OPHTHALMIC | Status: DC | PRN
  Administered 2024-08-07: .2 mL via OPHTHALMIC

## 2024-08-07 MED ORDER — CYCLOPENTOLATE HCL 2 % OP SOLN
1.0000 [drp] | OPHTHALMIC | Status: AC | PRN
  Administered 2024-08-07 (×3): 1 [drp] via OPHTHALMIC

## 2024-08-07 MED ORDER — SIGHTPATH DOSE#1 BSS IO SOLN
INTRAOCULAR | Status: DC | PRN
  Administered 2024-08-07: 78 mL via OPHTHALMIC

## 2024-08-07 MED ORDER — LIDOCAINE HCL (PF) 2 % IJ SOLN
INTRAOCULAR | Status: DC | PRN
  Administered 2024-08-07: 2 mL

## 2024-08-07 MED ORDER — PHENYLEPHRINE HCL 10 % OP SOLN
OPHTHALMIC | Status: AC
  Filled 2024-08-07: qty 5

## 2024-08-07 MED ORDER — CYCLOPENTOLATE HCL 2 % OP SOLN
OPHTHALMIC | Status: AC
  Filled 2024-08-07: qty 2

## 2024-08-07 MED ORDER — MIDAZOLAM HCL 2 MG/2ML IJ SOLN
INTRAMUSCULAR | Status: AC
  Filled 2024-08-07: qty 2

## 2024-08-07 MED ORDER — BRIMONIDINE TARTRATE-TIMOLOL 0.2-0.5 % OP SOLN
OPHTHALMIC | Status: DC | PRN
  Administered 2024-08-07: 1 [drp] via OPHTHALMIC

## 2024-08-07 MED ORDER — LACTATED RINGERS IV SOLN: INTRAVENOUS | Status: DC

## 2024-08-07 MED ORDER — TETRACAINE HCL 0.5 % OP SOLN
1.0000 [drp] | OPHTHALMIC | Status: DC | PRN
  Administered 2024-08-07 (×3): 1 [drp] via OPHTHALMIC

## 2024-08-07 MED ORDER — MIDAZOLAM HCL (PF) 2 MG/2ML IJ SOLN
INTRAMUSCULAR | Status: DC | PRN
  Administered 2024-08-07: 1.5 mg via INTRAVENOUS

## 2024-08-07 MED ORDER — SIGHTPATH DOSE#1 BSS IO SOLN
INTRAOCULAR | Status: DC | PRN
  Administered 2024-08-07: 15 mL via INTRAOCULAR

## 2024-08-07 MED ORDER — TETRACAINE HCL 0.5 % OP SOLN
OPHTHALMIC | Status: AC
  Filled 2024-08-07: qty 4

## 2024-08-07 NOTE — Transfer of Care (Signed)
 Immediate Anesthesia Transfer of Care Note  Patient: Mary Burke  Procedure(s) Performed: PHACOEMULSIFICATION, CATARACT, WITH IOL INSERTION 2.74 00:20.0 (Left: Eye)  Patient Location: PACU  Anesthesia Type: MAC  Level of Consciousness: awake, alert  and patient cooperative  Airway and Oxygen Therapy: Patient Spontanous Breathing and Patient connected to supplemental oxygen  Post-op Assessment: Post-op Vital signs reviewed, Patient's Cardiovascular Status Stable, Respiratory Function Stable, Patent Airway and No signs of Nausea or vomiting  Post-op Vital Signs: Reviewed and stable  Complications: No notable events documented.
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
## 2024-08-07 NOTE — Op Note (Signed)
 LOCATION:  [facility]   PREOPERATIVE DIAGNOSIS:  Nuclear sclerotic cataract of the left eye.  H25.12  POSTOPERATIVE DIAGNOSIS:  Nuclear sclerotic cataract of the left eye.   PROCEDURE:  Phacoemulsification with Toric posterior chamber intraocular lens placement of the left eye.  Ultrasound time: Procedures: PHACOEMULSIFICATION, CATARACT, WITH IOL INSERTION 2.74 00:20.0 (Left)  LENS:   Implant Name Type Inv. Item Serial No. Manufacturer Lot No. LRB No. Used Action  LENS IOL PANO PRO TORC 3 19.5 - [id number]  LENS IOL PANO PRO TORC 3 19.5 [id number] [employer]  Left 1 Implanted     [id number] Panoptix Toric intraocular lens with 1.5 diopters of cylindrical power with axis orientation at 100 degrees.     SURGEON:  Dene FABIENE Etienne, MD   ANESTHESIA:  Topical with tetracaine  drops and 2% Xylocaine  jelly, augmented with 1% preservative-free intracameral lidocaine .  COMPLICATIONS:  None.   DESCRIPTION OF PROCEDURE:  The patient was identified in the holding room and transported to the operating suite and placed in the supine position under the operating microscope.  The left eye was identified as the operative eye, and it was prepped and draped in the usual sterile ophthalmic fashion.    A clear-corneal paracentesis incision was made at the 1:30 position.  0.5 ml of preservative-free 1% lidocaine  was injected into the anterior chamber. The anterior chamber was filled with Viscoat.  A 2.4 millimeter near clear corneal incision was then made at the 10:30 position.  A cystotome and capsulorrhexis forceps were then used to make a curvilinear capsulorrhexis.  Hydrodissection and hydrodelineation were then performed using balanced salt solution.   Phacoemulsification was then used in stop and chop fashion to remove the lens, nucleus and epinucleus.  The remaining cortex was aspirated using the irrigation and aspiration handpiece.  Provisc viscoelastic was then placed into the capsular  bag to distend it for lens placement.  The Verion digital marker was used to align the implant at the intended axis.   A Toric lens was then injected into the capsular bag.  It was rotated clockwise until the axis marks on the lens were approximately 15 degrees in the counterclockwise direction to the intended alignment.  The viscoelastic was aspirated from the eye using the irrigation aspiration handpiece.  Then, a Koch spatula through the sideport incision was used to rotate the lens in a clockwise direction until the axis markings of the intraocular lens were lined up with the Verion alignment.  Balanced salt solution was then used to hydrate the wounds. Vigamox  0.2 ml of a 1mg  per ml solution was injected into the anterior chamber for a dose of 0.2 mg of intracameral antibiotic at the completion of the case.    The eye was noted to have a physiologic pressure and there was no wound leak noted.   Timolol  and Brimonidine  drops were applied to the eye.  The patient was taken to the recovery room in stable condition having had no complications of anesthesia or surgery.  Ciel Chervenak 08/07/2024, 8:00 AM

## 2024-08-07 NOTE — Anesthesia Postprocedure Evaluation (Signed)
"   Anesthesia Post Note  Patient: Mary Burke  Procedure(s) Performed: PHACOEMULSIFICATION, CATARACT, WITH IOL INSERTION 2.74 00:20.0 (Left: Eye)  Patient location during evaluation: PACU Anesthesia Type: MAC Level of consciousness: awake and alert Pain management: pain level controlled Vital Signs Assessment: post-procedure vital signs reviewed and stable Respiratory status: spontaneous breathing, nonlabored ventilation, respiratory function stable and patient connected to nasal cannula oxygen Cardiovascular status: blood pressure returned to baseline and stable Postop Assessment: no apparent nausea or vomiting Anesthetic complications: no   No notable events documented.   Last Vitals:  Vitals:   08/07/24 0639 08/07/24 0801  BP: 125/68 133/61  Pulse: 85 87  Resp: 11 15  Temp: 36.7 C (!) 36.1 C  SpO2: 100% 97%    Last Pain:  Vitals:   08/07/24 0801  TempSrc:   PainSc: 0-No pain                 Fairy A Becky Berberian      "

## 2024-08-07 NOTE — Anesthesia Preprocedure Evaluation (Addendum)
"                                    Anesthesia Evaluation  Patient identified by MRN, date of birth, ID band Patient awake    Reviewed: Allergy & Precautions, H&P , NPO status , Patient's Chart, lab work & pertinent test results  Airway Mallampati: II  TM Distance: >3 FB Neck ROM: Full    Dental no notable dental hx.    Pulmonary neg pulmonary ROS   Pulmonary exam normal breath sounds clear to auscultation       Cardiovascular hypertension, Normal cardiovascular exam Rhythm:Regular Rate:Normal     Neuro/Psych negative neurological ROS  negative psych ROS   GI/Hepatic negative GI ROS, Neg liver ROS,,,  Endo/Other  diabetes    Renal/GU negative Renal ROS  negative genitourinary   Musculoskeletal negative musculoskeletal ROS (+)    Abdominal   Peds negative pediatric ROS (+)  Hematology negative hematology ROS (+)   Anesthesia Other Findings   Reproductive/Obstetrics negative OB ROS                              Anesthesia Physical Anesthesia Plan  ASA: 3  Anesthesia Plan: MAC   Post-op Pain Management:    Induction: Intravenous  PONV Risk Score and Plan:   Airway Management Planned:   Additional Equipment:   Intra-op Plan:   Post-operative Plan: Extubation in OR  Informed Consent: I have reviewed the patients History and Physical, chart, labs and discussed the procedure including the risks, benefits and alternatives for the proposed anesthesia with the patient or authorized representative who has indicated his/her understanding and acceptance.     Dental advisory given  Plan Discussed with: CRNA  Anesthesia Plan Comments:          Anesthesia Quick Evaluation  "

## 2024-08-23 ENCOUNTER — Other Ambulatory Visit: Payer: Self-pay | Admitting: Internal Medicine

## 2024-08-23 DIAGNOSIS — D869 Sarcoidosis, unspecified: Secondary | ICD-10-CM

## 2024-08-23 DIAGNOSIS — I10 Essential (primary) hypertension: Secondary | ICD-10-CM

## 2024-09-05 ENCOUNTER — Ambulatory Visit

## 2024-09-18 ENCOUNTER — Ambulatory Visit: Admitting: Internal Medicine

## 2024-10-30 ENCOUNTER — Ambulatory Visit

## 2024-11-01 ENCOUNTER — Ambulatory Visit

## 2024-11-06 ENCOUNTER — Ambulatory Visit
